# Patient Record
Sex: Male | Born: 1970 | Race: White | Hispanic: No | Marital: Married | Smoking: Former smoker
Health system: Southern US, Community
[De-identification: ages and names within clinical notes are randomized; demographics above are authoritative.]

## PROBLEM LIST (undated history)

## (undated) DIAGNOSIS — I10 Essential (primary) hypertension: Secondary | ICD-10-CM

## (undated) DIAGNOSIS — J309 Allergic rhinitis, unspecified: Secondary | ICD-10-CM

## (undated) DIAGNOSIS — K589 Irritable bowel syndrome without diarrhea: Secondary | ICD-10-CM

## (undated) DIAGNOSIS — E785 Hyperlipidemia, unspecified: Secondary | ICD-10-CM

## (undated) DIAGNOSIS — E119 Type 2 diabetes mellitus without complications: Secondary | ICD-10-CM

## (undated) DIAGNOSIS — T884XXA Failed or difficult intubation, initial encounter: Secondary | ICD-10-CM

## (undated) DIAGNOSIS — F32A Depression, unspecified: Secondary | ICD-10-CM

## (undated) DIAGNOSIS — F329 Major depressive disorder, single episode, unspecified: Secondary | ICD-10-CM

## (undated) DIAGNOSIS — M549 Dorsalgia, unspecified: Secondary | ICD-10-CM

## (undated) DIAGNOSIS — F419 Anxiety disorder, unspecified: Secondary | ICD-10-CM

## (undated) DIAGNOSIS — R351 Nocturia: Secondary | ICD-10-CM

## (undated) DIAGNOSIS — Z87442 Personal history of urinary calculi: Secondary | ICD-10-CM

## (undated) DIAGNOSIS — G43909 Migraine, unspecified, not intractable, without status migrainosus: Secondary | ICD-10-CM

## (undated) DIAGNOSIS — T7840XA Allergy, unspecified, initial encounter: Secondary | ICD-10-CM

## (undated) DIAGNOSIS — G709 Myoneural disorder, unspecified: Secondary | ICD-10-CM

## (undated) HISTORY — DX: Allergic rhinitis, unspecified: J30.9

## (undated) HISTORY — DX: Migraine, unspecified, not intractable, without status migrainosus: G43.909

## (undated) HISTORY — DX: Myoneural disorder, unspecified: G70.9

## (undated) HISTORY — DX: Essential (primary) hypertension: I10

## (undated) HISTORY — DX: Allergy, unspecified, initial encounter: T78.40XA

## (undated) HISTORY — PX: FINGER SURGERY: SHX640

## (undated) HISTORY — DX: Irritable bowel syndrome, unspecified: K58.9

## (undated) HISTORY — DX: Anxiety disorder, unspecified: F41.9

## (undated) HISTORY — PX: CYSTOSCOPY: SUR368

## (undated) HISTORY — DX: Hyperlipidemia, unspecified: E78.5

## (undated) HISTORY — DX: Dorsalgia, unspecified: M54.9

---

## 1998-06-18 ENCOUNTER — Emergency Department (HOSPITAL_COMMUNITY): Admission: EM | Admit: 1998-06-18 | Discharge: 1998-06-18 | Payer: Self-pay | Admitting: Emergency Medicine

## 1998-09-06 ENCOUNTER — Emergency Department (HOSPITAL_COMMUNITY): Admission: EM | Admit: 1998-09-06 | Discharge: 1998-09-06 | Payer: Self-pay | Admitting: Emergency Medicine

## 1998-10-25 ENCOUNTER — Emergency Department (HOSPITAL_COMMUNITY): Admission: EM | Admit: 1998-10-25 | Discharge: 1998-10-25 | Payer: Self-pay | Admitting: Emergency Medicine

## 2000-02-17 ENCOUNTER — Emergency Department (HOSPITAL_COMMUNITY): Admission: EM | Admit: 2000-02-17 | Discharge: 2000-02-17 | Payer: Self-pay | Admitting: Emergency Medicine

## 2000-06-26 ENCOUNTER — Emergency Department (HOSPITAL_COMMUNITY): Admission: EM | Admit: 2000-06-26 | Discharge: 2000-06-26 | Payer: Self-pay

## 2000-11-26 ENCOUNTER — Emergency Department (HOSPITAL_COMMUNITY): Admission: EM | Admit: 2000-11-26 | Discharge: 2000-11-27 | Payer: Self-pay | Admitting: Emergency Medicine

## 2000-11-26 ENCOUNTER — Encounter: Payer: Self-pay | Admitting: Emergency Medicine

## 2000-11-27 ENCOUNTER — Encounter: Payer: Self-pay | Admitting: Emergency Medicine

## 2000-12-24 ENCOUNTER — Ambulatory Visit (HOSPITAL_COMMUNITY): Admission: RE | Admit: 2000-12-24 | Discharge: 2000-12-24 | Payer: Self-pay | Admitting: Neurology

## 2000-12-24 ENCOUNTER — Encounter: Payer: Self-pay | Admitting: Neurology

## 2001-03-14 ENCOUNTER — Emergency Department (HOSPITAL_COMMUNITY): Admission: EM | Admit: 2001-03-14 | Discharge: 2001-03-14 | Payer: Self-pay | Admitting: Emergency Medicine

## 2003-03-14 ENCOUNTER — Emergency Department (HOSPITAL_COMMUNITY): Admission: EM | Admit: 2003-03-14 | Discharge: 2003-03-14 | Payer: Self-pay | Admitting: Emergency Medicine

## 2003-05-06 ENCOUNTER — Emergency Department (HOSPITAL_COMMUNITY): Admission: EM | Admit: 2003-05-06 | Discharge: 2003-05-06 | Payer: Self-pay | Admitting: Emergency Medicine

## 2005-05-07 ENCOUNTER — Ambulatory Visit: Payer: Self-pay | Admitting: Internal Medicine

## 2005-10-23 ENCOUNTER — Ambulatory Visit: Payer: Self-pay | Admitting: Internal Medicine

## 2007-08-18 DIAGNOSIS — G43909 Migraine, unspecified, not intractable, without status migrainosus: Secondary | ICD-10-CM | POA: Insufficient documentation

## 2007-08-18 DIAGNOSIS — J309 Allergic rhinitis, unspecified: Secondary | ICD-10-CM | POA: Insufficient documentation

## 2007-08-18 HISTORY — DX: Allergic rhinitis, unspecified: J30.9

## 2007-12-08 DIAGNOSIS — J069 Acute upper respiratory infection, unspecified: Secondary | ICD-10-CM | POA: Insufficient documentation

## 2007-12-11 ENCOUNTER — Ambulatory Visit: Payer: Self-pay | Admitting: Family Medicine

## 2008-03-07 ENCOUNTER — Ambulatory Visit: Payer: Self-pay | Admitting: Internal Medicine

## 2008-03-07 DIAGNOSIS — K5289 Other specified noninfective gastroenteritis and colitis: Secondary | ICD-10-CM | POA: Insufficient documentation

## 2008-08-29 ENCOUNTER — Ambulatory Visit: Payer: Self-pay | Admitting: Internal Medicine

## 2008-08-31 ENCOUNTER — Telehealth: Payer: Self-pay | Admitting: Internal Medicine

## 2008-09-01 ENCOUNTER — Ambulatory Visit: Payer: Self-pay | Admitting: Internal Medicine

## 2008-09-01 ENCOUNTER — Encounter: Payer: Self-pay | Admitting: Internal Medicine

## 2008-09-01 LAB — CONVERTED CEMR LAB
AST: 26 units/L (ref 0–37)
Alkaline Phosphatase: 54 units/L (ref 39–117)
BUN: 11 mg/dL (ref 6–23)
Bilirubin, Direct: 0.1 mg/dL (ref 0.0–0.3)
Chloride: 106 meq/L (ref 96–112)
Eosinophils Absolute: 0.2 10*3/uL (ref 0.0–0.7)
Eosinophils Relative: 3.4 % (ref 0.0–5.0)
GFR calc Af Amer: 109 mL/min
GFR calc non Af Amer: 90 mL/min
MCV: 91.1 fL (ref 78.0–100.0)
Monocytes Absolute: 0.4 10*3/uL (ref 0.1–1.0)
Neutrophils Relative %: 62 % (ref 43.0–77.0)
Platelets: 293 10*3/uL (ref 150–400)
Potassium: 3.9 meq/L (ref 3.5–5.1)
RDW: 11.3 % — ABNORMAL LOW (ref 11.5–14.6)
Sodium: 144 meq/L (ref 135–145)
Total Bilirubin: 0.8 mg/dL (ref 0.3–1.2)
WBC: 5.2 10*3/uL (ref 4.5–10.5)

## 2008-09-27 ENCOUNTER — Ambulatory Visit: Payer: Self-pay | Admitting: Internal Medicine

## 2008-10-01 ENCOUNTER — Telehealth: Payer: Self-pay | Admitting: Family Medicine

## 2009-02-06 ENCOUNTER — Ambulatory Visit: Payer: Self-pay | Admitting: Internal Medicine

## 2009-04-19 ENCOUNTER — Emergency Department (HOSPITAL_COMMUNITY): Admission: EM | Admit: 2009-04-19 | Discharge: 2009-04-19 | Payer: Self-pay | Admitting: Emergency Medicine

## 2009-11-27 ENCOUNTER — Telehealth: Payer: Self-pay | Admitting: Internal Medicine

## 2010-01-08 ENCOUNTER — Ambulatory Visit: Payer: Self-pay | Admitting: Internal Medicine

## 2010-02-09 ENCOUNTER — Ambulatory Visit: Payer: Self-pay | Admitting: Internal Medicine

## 2010-02-09 DIAGNOSIS — R109 Unspecified abdominal pain: Secondary | ICD-10-CM | POA: Insufficient documentation

## 2010-02-09 DIAGNOSIS — I10 Essential (primary) hypertension: Secondary | ICD-10-CM | POA: Insufficient documentation

## 2010-02-09 DIAGNOSIS — F411 Generalized anxiety disorder: Secondary | ICD-10-CM | POA: Insufficient documentation

## 2010-02-09 LAB — CONVERTED CEMR LAB
ALT: 32 units/L (ref 0–53)
AST: 22 units/L (ref 0–37)
BUN: 12 mg/dL (ref 6–23)
Basophils Relative: 0.7 % (ref 0.0–3.0)
Bilirubin, Direct: 0 mg/dL (ref 0.0–0.3)
Chloride: 106 meq/L (ref 96–112)
Eosinophils Relative: 3.5 % (ref 0.0–5.0)
GFR calc non Af Amer: 100.9 mL/min (ref >60)
HCT: 45.2 % (ref 39.0–52.0)
HDL: 42.6 mg/dL (ref >39.00)
Lymphs Abs: 0.8 10*3/uL (ref 0.7–4.0)
MCV: 92.3 fL (ref 78.0–100.0)
Monocytes Absolute: 0.4 10*3/uL (ref 0.1–1.0)
Monocytes Relative: 11 % (ref 3.0–12.0)
Platelets: 225 10*3/uL (ref 150.0–400.0)
Potassium: 3.5 meq/L (ref 3.5–5.1)
RBC: 4.9 M/uL (ref 4.22–5.81)
Sodium: 141 meq/L (ref 135–145)
TSH: 0.68 microintl units/mL (ref 0.35–5.50)
Total Bilirubin: 0.8 mg/dL (ref 0.3–1.2)
Total CHOL/HDL Ratio: 5
Total Protein: 7.8 g/dL (ref 6.0–8.3)
VLDL: 26 mg/dL (ref 0.0–40.0)
WBC: 3.7 10*3/uL — ABNORMAL LOW (ref 4.5–10.5)

## 2010-02-12 DIAGNOSIS — E785 Hyperlipidemia, unspecified: Secondary | ICD-10-CM | POA: Insufficient documentation

## 2010-02-12 HISTORY — DX: Hyperlipidemia, unspecified: E78.5

## 2010-03-09 ENCOUNTER — Ambulatory Visit: Payer: Self-pay | Admitting: Internal Medicine

## 2010-03-09 DIAGNOSIS — M549 Dorsalgia, unspecified: Secondary | ICD-10-CM | POA: Insufficient documentation

## 2010-03-13 ENCOUNTER — Encounter (INDEPENDENT_AMBULATORY_CARE_PROVIDER_SITE_OTHER): Payer: Self-pay | Admitting: *Deleted

## 2010-09-04 ENCOUNTER — Emergency Department (HOSPITAL_COMMUNITY): Admission: EM | Admit: 2010-09-04 | Discharge: 2010-09-04 | Payer: Self-pay | Admitting: Emergency Medicine

## 2010-09-04 ENCOUNTER — Ambulatory Visit: Payer: Self-pay | Admitting: Internal Medicine

## 2010-09-05 ENCOUNTER — Telehealth: Payer: Self-pay | Admitting: Internal Medicine

## 2010-09-05 ENCOUNTER — Ambulatory Visit (HOSPITAL_COMMUNITY): Admission: RE | Admit: 2010-09-05 | Discharge: 2010-09-05 | Payer: Self-pay | Admitting: Urology

## 2010-09-05 ENCOUNTER — Encounter: Payer: Self-pay | Admitting: Internal Medicine

## 2010-09-11 ENCOUNTER — Encounter: Payer: Self-pay | Admitting: Internal Medicine

## 2010-09-20 ENCOUNTER — Ambulatory Visit (HOSPITAL_COMMUNITY): Admission: RE | Admit: 2010-09-20 | Discharge: 2010-09-20 | Payer: Self-pay | Admitting: Urology

## 2010-10-04 ENCOUNTER — Telehealth: Payer: Self-pay | Admitting: Internal Medicine

## 2010-10-18 ENCOUNTER — Encounter: Payer: Self-pay | Admitting: Internal Medicine

## 2011-01-10 NOTE — Letter (Signed)
Summary: [facility]  [facility]   Imported By: Maryln Gottron 09/18/2010 10:39:12  _____________________________________________________________________  External Attachment:    Type:   Image     Comment:   External Document

## 2011-01-10 NOTE — Assessment & Plan Note (Signed)
Summary: stomach upset/abdominal pain/diarrhea/cjr   Vital Signs:  Patient profile:   40 year old male Weight:      185 pounds Temp:     98.1 degrees F oral BP sitting:   148 / 102  (left arm) Cuff size:   regular  Vitals Entered By: Raechel Ache, RN (January 08, 2010 4:21 PM) CC: C/o abdominal pain since Saturday 3am   CC:  C/o abdominal pain since Saturday 3am.  History of Present Illness: 40 year old patient who presents with a 3-day history of abdominal pain.  The pain awoke the patient at approximately 3 a.m. Saturday morning and has persisted intermittently over the past two days.  Pain has been fairly constant in a generalized distribution.  He has had some mild nausea and for the past two days watery diarrhea.  There is been no vomiting, and his appetite is fairly well maintained.Marland Kitchen  He denies any fever  Allergies: 1)  ! Penicillin V Potassium (Penicillin V Potassium) 2)  * Doxycycline  Past History:  Past Medical History: Reviewed history from 08/18/2007 and no changes required. Allergic rhinitis migraine headache  Review of Systems       The patient complains of anorexia and abdominal pain.  The patient denies fever, weight loss, weight gain, vision loss, decreased hearing, hoarseness, chest pain, syncope, dyspnea on exertion, peripheral edema, prolonged cough, headaches, hemoptysis, melena, hematochezia, severe indigestion/heartburn, hematuria, incontinence, genital sores, muscle weakness, suspicious skin lesions, transient blindness, difficulty walking, depression, unusual weight change, abnormal bleeding, enlarged lymph nodes, angioedema, breast masses, and testicular masses.    Physical Exam  General:  Well-developed,well-nourished,in no acute distress; alert,appropriate and cooperative throughout examination Head:  Normocephalic and atraumatic without obvious abnormalities. No apparent alopecia or balding. Eyes:  No corneal or conjunctival inflammation noted.  EOMI. Perrla. Funduscopic exam benign, without hemorrhages, exudates or papilledema. Vision grossly normal. Mouth:  Oral mucosa and oropharynx without lesions or exudates.  Teeth in good repair. Neck:  No deformities, masses, or tenderness noted. Lungs:  Normal respiratory effort, chest expands symmetrically. Lungs are clear to auscultation, no crackles or wheezes. Heart:  Normal rate and regular rhythm. S1 and S2 normal without gallop, murmur, click, rub or other extra sounds. Abdomen:  mild diffuse tenderness; no guarding or rebound bowel sounds are active Msk:  No deformity or scoliosis noted of thoracic or lumbar spine.     Impression & Recommendations:  Problem # 1:  GASTROENTERITIS (ICD-558.9) this appears to be the most likely diagnosis.  The patient is aware of the possibility of atypical appendicitis and will report to the ED for CT abdomen if pain worsens tonight.  Will observe and treat symptomatically  Complete Medication List: 1)  Paxil 20 Mg Tabs (Paroxetine hcl) .... Take 1 tablet by mouth once a day 2)  Promethazine Hcl 25 Mg Tabs (Promethazine hcl) .... As needed 3)  Hydrocodone-acetaminophen 5-500 Mg Tabs (Hydrocodone-acetaminophen) .... One every 4-6 hours for pain  Patient Instructions: 1)  Drink plenty of fluids and take solids as you feel better. If you are unable to keep anything down and/or you show signs of dehydration(dry/cracked lips, lack of tears, not urinating, very sleepy), call our office. 2)  Call if any worsening symptoms and report to ED for X ray if pain severe Prescriptions: PROMETHAZINE HCL 25 MG  TABS (PROMETHAZINE HCL) as needed  #20 x 0   Entered and Authorized by:   Gordy Savers  MD   Signed by:   Theron Arista  Lysle Dingwall  MD on 01/08/2010   Method used:   Print then Give to Patient   RxID:   [id number] HYDROCODONE-ACETAMINOPHEN 5-500 MG  TABS (HYDROCODONE-ACETAMINOPHEN) one every 4-6 hours for pain  #50 x 0   Entered and Authorized by:    Gordy Savers  MD   Signed by:   Gordy Savers  MD on 01/08/2010   Method used:   Print then Give to Patient   RxID:   [id number]

## 2011-01-10 NOTE — Assessment & Plan Note (Signed)
Summary: 1 month fup//ccm   Vital Signs:  Patient profile:   40 year old male Weight:      182 pounds Temp:     97.9 degrees F oral BP sitting:   120 / 80  (left arm) Cuff size:   regular  Vitals Entered By: Duard Brady LPN (March 09, 1609 4:23 PM) CC: 1 mos rov - c/o low back pain - no injury or fall  Is Patient Diabetic? No   CC:  1 mos rov - c/o low back pain - no injury or fall .  History of Present Illness: 40 -year-old patient who is seen today for follow-up of his hypertension.  Recently he has had some left lumbar pain following heavy lifting of a TV.  He has a long history of chronic abdominal pain that persists and this is his chief complaint today.  It is felt that he probably has IBS.  He is on a SSRI and has a high fiber diet and anti-spasmodics without much benefit.  He is requesting a GI referral. He was placed on antihypertensive medication.  Last visit, which he tolerates well.  Blood pressure today is well controlled  Preventive Screening-Counseling & Management  Alcohol-Tobacco     Smoking Status: quit  Allergies: 1)  ! Penicillin V Potassium (Penicillin V Potassium) 2)  * Doxycycline  Past History:  Past Medical History: Reviewed history from 02/09/2010 and no changes required. Allergic rhinitis migraine headache Hypertension Anxiety probable IBS Hyperlipidemia  Review of Systems       The patient complains of abdominal pain.  The patient denies anorexia, fever, weight loss, weight gain, vision loss, decreased hearing, hoarseness, chest pain, syncope, dyspnea on exertion, peripheral edema, prolonged cough, headaches, hemoptysis, melena, hematochezia, severe indigestion/heartburn, hematuria, incontinence, genital sores, muscle weakness, suspicious skin lesions, transient blindness, difficulty walking, depression, unusual weight change, abnormal bleeding, enlarged lymph nodes, angioedema, breast masses, and testicular masses.    Physical  Exam  General:  Well-developed,well-nourished,in no acute distress; alert,appropriate and cooperative throughout examination; 118/78 Neck:  No deformities, masses, or tenderness noted. Lungs:  Normal respiratory effort, chest expands symmetrically. Lungs are clear to auscultation, no crackles or wheezes. Heart:  Normal rate and regular rhythm. S1 and S2 normal without gallop, murmur, click, rub or other extra sounds. Abdomen:  mild diffuse tenderness.  No masses.  Active bowel sounds.  No organomegaly.  Pain is maximal in the periumbilical and epigastric region   Impression & Recommendations:  Problem # 1:  HYPERTENSION (ICD-401.9)  The following medications were removed from the medication list:    Benazepril Hcl 20 Mg Tabs (Benazepril hcl) ..... One daily His updated medication list for this problem includes:    Lisinopril-hydrochlorothiazide 20-25 Mg Tabs (Lisinopril-hydrochlorothiazide) ..... One daily  Problem # 2:  ABDOMINAL PAIN (ICD-789.00)  Orders: Gastroenterology Referral (GI)  Problem # 3:  BACK PAIN (ICD-724.5)  His updated medication list for this problem includes:    Hydrocodone-acetaminophen 5-500 Mg Tabs (Hydrocodone-acetaminophen) ..... One every 4-6 hours for pain    Celebrex 200 Mg Caps (Celecoxib) .Marland Kitchen..Marland Kitchen Two daily    Cyclobenzaprine Hcl 10 Mg Tabs (Cyclobenzaprine hcl) ..... One three times daily as needed  Complete Medication List: 1)  Paxil 20 Mg Tabs (Paroxetine hcl) .... Take 1 tablet by mouth once a day 2)  Promethazine Hcl 25 Mg Tabs (Promethazine hcl) .... As needed 3)  Hydrocodone-acetaminophen 5-500 Mg Tabs (Hydrocodone-acetaminophen) .... One every 4-6 hours for pain 4)  Hyomax-sl 0.125 Mg Subl (  Hyoscyamine sulfate) .... One sublingually p.r.n. abdominal pain 5)  Celebrex 200 Mg Caps (Celecoxib) .... Two daily 6)  Cyclobenzaprine Hcl 10 Mg Tabs (Cyclobenzaprine hcl) .... One three times daily as needed 7)  Lisinopril-hydrochlorothiazide 20-25 Mg  Tabs (Lisinopril-hydrochlorothiazide) .... One daily  Patient Instructions: 1)  Most patients (90%) with low back pain will improve with time (2-6 days). Keep active but avoid activities that are painful. Apply moist heat and/or ice to lower back several times a day. 2)  GI referral, as scheduled Prescriptions: LISINOPRIL-HYDROCHLOROTHIAZIDE 20-25 MG TABS (LISINOPRIL-HYDROCHLOROTHIAZIDE) one daily  #90 x 2   Entered and Authorized by:   Gordy Savers  MD   Signed by:   Gordy Savers  MD on 03/09/2010   Method used:   Print then Give to Patient   RxID:   [id number] CYCLOBENZAPRINE HCL 10 MG TABS (CYCLOBENZAPRINE HCL) one three times daily as needed  #30 x 0   Entered and Authorized by:   Gordy Savers  MD   Signed by:   Gordy Savers  MD on 03/09/2010   Method used:   Print then Give to Patient   RxID:   [id number] HYDROCODONE-ACETAMINOPHEN 5-500 MG  TABS (HYDROCODONE-ACETAMINOPHEN) one every 4-6 hours for pain  #50 x 2and a   Entered and Authorized by:   Gordy Savers  MD   Signed by:   Gordy Savers  MD on 03/09/2010   Method used:   Print then Give to Patient   RxID:   [id number] CYCLOBENZAPRINE HCL 10 MG TABS (CYCLOBENZAPRINE HCL) one three times daily as needed  #30 x 0   Entered and Authorized by:   Gordy Savers  MD   Signed by:   Gordy Savers  MD on 03/09/2010   Method used:   Print then Give to Patient   RxID:   [id number] BENAZEPRIL HCL 20 MG TABS (BENAZEPRIL HCL) one daily  #90 x 2   Entered and Authorized by:   Gordy Savers  MD   Signed by:   Gordy Savers  MD on 03/09/2010   Method used:   Print then Give to Patient   RxID:   [id number] HYDROCODONE-ACETAMINOPHEN 5-500 MG  TABS (HYDROCODONE-ACETAMINOPHEN) one every 4-6 hours for pain  #50 x 2and a   Entered and Authorized by:   Gordy Savers  MD   Signed by:   Gordy Savers  MD on 03/09/2010   Method used:   Print then  Give to Patient   RxID:   [id number]

## 2011-01-10 NOTE — Letter (Signed)
Summary: [facility]  [facility]   Imported By: Maryln Gottron 09/12/2010 13:56:16  _____________________________________________________________________  External Attachment:    Type:   Image     Comment:   External Document

## 2011-01-10 NOTE — Assessment & Plan Note (Signed)
Summary: CPX/NJR/pt coming in fasting/pr rsc/cjr   Vital Signs:  Patient profile:   40 year old Henry Height:      69.5 inches Weight:      182 pounds BMI:     26.59 Temp:     98.6 degrees F oral BP sitting:   152 / 100  (right arm) Cuff size:   regular  Vitals Entered By: Duard Brady LPN (February 09, 1609 8:46 AM) CC: cpx - has concerned to discuss Is Patient Diabetic? No   CC:  cpx - has concerned to discuss.  History of Present Illness: 40 year old patient who is seen today for a wellness exam.  He was seen two months ago with the abdominal pain and acute diarrheal illness.  He continues to have postprandial generalized crampy abdominal pain that  lasts  15 to 60 minutes.  He states that he has had stomach issues chronically and these are very similar.  Denies any change in his bowel habits.  Stool is well formed and regular.  Preventive Screening-Counseling & Management  Alcohol-Tobacco     Smoking Status: quit  Allergies: 1)  ! Penicillin V Potassium (Penicillin V Potassium) 2)  * Doxycycline  Past History:  Past Medical History: Allergic rhinitis migraine headache Hypertension Anxiety probable IBS Hyperlipidemia  Past Surgical History: none   Family History: Reviewed history and no changes required. Father- age 15 Unclear details PGM- DM2 Mother- age 51 good health  2 step brothers One step sister  Social History: Reviewed history and no changes required. d/c tobacco 5 years ago Married   one step daughter age 50 Smoking Status:  quit  Review of Systems       The patient complains of abdominal pain and testicular masses.  The patient denies anorexia, fever, weight loss, weight gain, vision loss, decreased hearing, hoarseness, chest pain, syncope, dyspnea on exertion, peripheral edema, prolonged cough, headaches, hemoptysis, melena, hematochezia, severe indigestion/heartburn, hematuria, incontinence, genital sores, muscle weakness, suspicious  skin lesions, transient blindness, difficulty walking, depression, unusual weight change, abnormal bleeding, enlarged lymph nodes, angioedema, and breast masses.    Physical Exam  General:  Well-developed,well-nourished,in no acute distress; alert,appropriate and cooperative throughout examination; 160/100 Head:  Normocephalic and atraumatic without obvious abnormalities. No apparent alopecia or balding. Eyes:  No corneal or conjunctival inflammation noted. EOMI. Perrla. Funduscopic exam benign, without hemorrhages, exudates or papilledema. Vision grossly normal. Ears:  External ear exam shows no significant lesions or deformities.  Otoscopic examination reveals clear canals, tympanic membranes are intact bilaterally without bulging, retraction, inflammation or discharge. Hearing is grossly normal bilaterally. Nose:  External nasal examination shows no deformity or inflammation. Nasal mucosa are pink and moist without lesions or exudates. Mouth:  Oral mucosa and oropharynx without lesions or exudates.  Teeth in good repair. Neck:  No deformities, masses, or tenderness noted. Chest Wall:  No deformities, masses, tenderness or gynecomastia noted. Breasts:  No masses or gynecomastia noted Lungs:  Normal respiratory effort, chest expands symmetrically. Lungs are clear to auscultation, no crackles or wheezes. Heart:  Normal rate and regular rhythm. S1 and S2 normal without gallop, murmur, click, rub or other extra sounds. Abdomen:  bowel sounds are normal.  He had mild tenderness fairly diffusely.  No organomegaly or masses.  No distention Genitalia:  right testicle is enlarged, probably with a varicocele.  This has been chronic and unchanged for years Msk:  No deformity or scoliosis noted of thoracic or lumbar spine.   Pulses:  R and L  carotid,radial,femoral,dorsalis pedis and posterior tibial pulses are full and equal bilaterally Extremities:  No clubbing, cyanosis, edema, or deformity noted with  normal full range of motion of all joints.   Neurologic:  No cranial nerve deficits noted. Station and gait are normal. Plantar reflexes are down-going bilaterally. DTRs are symmetrical throughout. Sensory, motor and coordinative functions appear intact. Skin:  Intact without suspicious lesions or rashes Cervical Nodes:  No lymphadenopathy noted Axillary Nodes:  No palpable lymphadenopathy Inguinal Nodes:  No significant adenopathy Psych:  Cognition and judgment appear intact. Alert and cooperative with normal attention span and concentration. No apparent delusions, illusions, hallucinations   Impression & Recommendations:  Problem # 1:  Preventive Health Care (ICD-V70.0)  Orders: Venipuncture (16109) TLB-Lipid Panel (80061-LIPID) TLB-BMP (Basic Metabolic Panel-BMET) (80048-METABOL) TLB-CBC Platelet - w/Differential (85025-CBCD) TLB-Hepatic/Liver Function Pnl (80076-HEPATIC) TLB-TSH (Thyroid Stimulating Hormone) (84443-TSH)  Problem # 2:  HYPERTENSION (ICD-401.9) will place on Benicar HCT and recheck in 4 weeks  Problem # 3:  ABDOMINAL PAIN (ICD-789.00) probably has IBS.  Will treat with sublingual levsin and high fiber diet  Complete Medication List: 1)  Paxil 20 Mg Tabs (Paroxetine hcl) .... Take 1 tablet by mouth once a day 2)  Promethazine Hcl 25 Mg Tabs (Promethazine hcl) .... As needed 3)  Hydrocodone-acetaminophen 5-500 Mg Tabs (Hydrocodone-acetaminophen) .... One every 4-6 hours for pain 4)  Hyomax-sl 0.125 Mg Subl (Hyoscyamine sulfate) .... One sublingually p.r.n. abdominal pain  Patient Instructions: 1)  Please schedule a follow-up appointment in 1 month. 2)  Limit your Sodium (Salt). 3)  It is important that you exercise regularly at least 20 minutes 5 times a week. If you develop chest pain, have severe difficulty breathing, or feel very tired , stop exercising immediately and seek medical attention. 4)  You need to lose weight. Consider a lower calorie diet and  regular exercise.  5)  Check your Blood Pressure regularly. If it is above: 150/90 you should make an appointment. Prescriptions: HYOMAX-SL 0.125 MG SUBL (HYOSCYAMINE SULFATE) one sublingually p.r.n. abdominal pain  #90 x 0   Entered and Authorized by:   Gordy Savers  MD   Signed by:   Gordy Savers  MD on 02/09/2010   Method used:   Print then Give to Patient   RxID:   [id number] HYOMAX-SL 0.125 MG SUBL (HYOSCYAMINE SULFATE) one sublingually p.r.n. abdominal pain  #90 x 0   Entered and Authorized by:   Gordy Savers  MD   Signed by:   Gordy Savers  MD on 02/09/2010   Method used:   Electronically to        [facility]  [phone]* (retail)       [address] [place], [place]  [address]       Ph: [phone] or [phone]       Fax: [phone]   RxID:   [id number] PAXIL 20 MG  TABS (PAROXETINE HCL) Take 1 tablet by mouth once a day  #90 x 2   Entered and Authorized by:   Gordy Savers  MD   Signed by:   Gordy Savers  MD on 02/09/2010   Method used:   Electronically to        [facility]  [phone]* (retail)       [address] [place], [place]  [address]       Ph: [phone] or [phone]  Fax: [phone]   RxID:   [id number]

## 2011-01-10 NOTE — Progress Notes (Signed)
Summary: Update  Phone Note Call from Patient   Caller: Patient Call For: Gordy Savers  MD Summary of Call: Bilateral kidney stones and pylenephritis (left) Is taking Rocephin, Pepsid , Protonix, pain and nausea meds.  Seeing Uro today. Was seen in ER last night. Question is whether to get more Benicar samples or prescription.  [facility] [phone] Initial call taken by: Lynann Beaver CMA,  September 05, 2010 8:23 AM  Follow-up for Phone Call        OK to contine lisinopril-hctz RX $10/3 months Follow-up by: Gordy Savers  MD,  September 05, 2010 8:39 AM    Prescriptions: LISINOPRIL-HYDROCHLOROTHIAZIDE 20-25 MG TABS (LISINOPRIL-HYDROCHLOROTHIAZIDE) one daily  #90 x 2   Entered by:   Lynann Beaver CMA   Authorized by:   Gordy Savers  MD   Signed by:   Lynann Beaver CMA on 09/05/2010   Method used:   Electronically to        [facility]  [phone]* (retail)       [address] [place], [place]  [address]       Ph: [phone] or [phone]       Fax: [phone]   RxID:   [id number]

## 2011-01-10 NOTE — Progress Notes (Signed)
Summary: refill hydrocodone  Phone Note From Pharmacy   Call[facility]  [phone]* Call For: k  Summary of Call: refill hydrcodone 5/500 1 by mouth every 4-6 hours as needed for pain Initial call taken by: Alfred Levins, CMA,  October 04, 2010 8:54 AM  Follow-up for Phone Call        Phone call completed, Pharmacist called Follow-up by: Alfred Levins, CMA,  October 04, 2010 11:28 AM    Prescriptions: HYDROCODONE-ACETAMINOPHEN 5-500 MG  TABS (HYDROCODONE-ACETAMINOPHEN) one every 4-6 hours for pain  #50 x 3   Entered by:   Alfred Levins, CMA   Authorized by:   Gordy Savers  MD   Signed by:   Alfred Levins, CMA on 10/04/2010   Method used:   Telephoned to ...       [facility]  [phone]* (retail)       [address] [place], [place]  [address]       Ph: [phone] or [phone]       Fax: [phone]   RxID:   [id number]  #50  RF 3

## 2011-01-10 NOTE — Letter (Signed)
Summary: New Patient letter  [facility]  [address] [place], [place] [address]   Phone: [phone]  Fax: [phone]       03/13/2010 MRN: [id number]  [facility] [address] [place], [place]  [address]  Dear Dustin Henry,  Welcome to the Gastroenterology Division at [facility].    You are scheduled to see Dr.  Jarold Motto on 04-13-10 at 9:30am on the 3rd floor at [facility], [address]  We ask that you try to arrive at our office 15 minutes prior to your appointment time to allow for check-in.  We would like you to complete the enclosed self-administered evaluation form prior to your visit and bring it with you on the day of your appointment.  We will review it with you.  Also, please bring a complete list of all your medications or, if you prefer, bring the medication bottles and we will list them.  Please bring your insurance card so that we may make a copy of it.  If your insurance requires a referral to see a specialist, please bring your referral form from your primary care physician.  Co-payments are due at the time of your visit and may be paid by cash, check or credit card.     Your office visit will consist of a consult with your physician (includes a physical exam), any laboratory testing he/she may order, scheduling of any necessary diagnostic testing (e.g. x-ray, ultrasound, CT-scan), and scheduling of a procedure (e.g. Endoscopy, Colonoscopy) if required.  Please allow enough time on your schedule to allow for any/all of these possibilities.    If you cannot keep your appointment, please call [phone] to cancel or reschedule prior to your appointment date.  This allows [place] the opportunity to schedule an appointment for another patient in need of care.  If you do not cancel or reschedule by 5 p.m. the business day prior to your appointment date, you will be charged a $50.00 late cancellation/no-show fee.    Thank you for choosing  [facility] for your medical needs.  We appreciate the opportunity to care for you.  Please visit [place] at our website  to learn more about our practice.                     Sincerely,                                                             The Gastroenterology Division

## 2011-01-10 NOTE — Letter (Signed)
Summary: [facility]  [facility]   Imported By: Maryln Gottron 10/25/2010 09:23:46  _____________________________________________________________________  External Attachment:    Type:   Image     Comment:   External Document

## 2011-02-21 LAB — BASIC METABOLIC PANEL
CO2: 30 mEq/L (ref 19–32)
Chloride: 102 mEq/L (ref 96–112)
GFR calc Af Amer: >60 mL/min (ref >60)
Glucose, Bld: 103 mg/dL — ABNORMAL HIGH (ref 70–99)
Sodium: 139 mEq/L (ref 135–145)

## 2011-02-21 LAB — URINE CULTURE
Culture  Setup Time: 201109280132
Culture: NO GROWTH

## 2011-02-21 LAB — URINALYSIS, ROUTINE W REFLEX MICROSCOPIC
Bilirubin Urine: NEGATIVE
Glucose, UA: NEGATIVE mg/dL
Hgb urine dipstick: NEGATIVE
Specific Gravity, Urine: 1.016 (ref 1.005–1.030)

## 2011-02-21 LAB — CBC
HCT: 40.2 % (ref 39.0–52.0)
Hemoglobin: 14.2 g/dL (ref 13.0–17.0)
MCV: 89.8 fL (ref 78.0–100.0)
RBC: 4.48 MIL/uL (ref 4.22–5.81)
WBC: 11.2 10*3/uL — ABNORMAL HIGH (ref 4.0–10.5)

## 2011-02-21 LAB — COMPREHENSIVE METABOLIC PANEL
Albumin: 4.7 g/dL (ref 3.5–5.2)
Alkaline Phosphatase: 44 U/L (ref 39–117)
BUN: 15 mg/dL (ref 6–23)
CO2: 24 mEq/L (ref 19–32)
Chloride: 104 mEq/L (ref 96–112)
GFR calc non Af Amer: 58 mL/min — ABNORMAL LOW (ref >60)
Glucose, Bld: 96 mg/dL (ref 70–99)
Potassium: 3.8 mEq/L (ref 3.5–5.1)
Total Bilirubin: 0.5 mg/dL (ref 0.3–1.2)

## 2011-02-21 LAB — CK TOTAL AND CKMB (NOT AT ARMC): CK, MB: 2.6 ng/mL (ref 0.3–4.0)

## 2011-02-21 LAB — DIFFERENTIAL
Basophils Absolute: 0 10*3/uL (ref 0.0–0.1)
Basophils Relative: 0 % (ref 0–1)
Monocytes Absolute: 0.7 10*3/uL (ref 0.1–1.0)
Neutro Abs: 9.8 10*3/uL — ABNORMAL HIGH (ref 1.7–7.7)

## 2011-02-21 LAB — LIPASE, BLOOD: Lipase: 19 U/L (ref 11–59)

## 2011-02-26 ENCOUNTER — Telehealth: Payer: Self-pay

## 2011-02-26 DIAGNOSIS — R52 Pain, unspecified: Secondary | ICD-10-CM

## 2011-02-26 NOTE — Telephone Encounter (Signed)
Fax recv'd from [facility] -refill request for vicodin 5-500mg  Last seen 03/2010 , last Rx'd 10/2702011 #50 3RF Please advise

## 2011-02-28 MED ORDER — HYDROCODONE-ACETAMINOPHEN 5-500 MG PO TABS: 1.0000 | ORAL_TABLET | Freq: Four times a day (QID) | ORAL | Status: AC | PRN

## 2011-02-28 NOTE — Telephone Encounter (Signed)
#  50  No RF

## 2011-02-28 NOTE — Telephone Encounter (Signed)
[facility] 

## 2011-03-19 LAB — CBC
MCHC: 34.7 g/dL (ref 30.0–36.0)
MCV: 90.3 fL (ref 78.0–100.0)
Platelets: 242 10*3/uL (ref 150–400)
WBC: 4.2 10*3/uL (ref 4.0–10.5)

## 2011-03-19 LAB — COMPREHENSIVE METABOLIC PANEL
AST: 29 U/L (ref 0–37)
Albumin: 4.4 g/dL (ref 3.5–5.2)
Calcium: 9.2 mg/dL (ref 8.4–10.5)
Creatinine, Ser: 0.9 mg/dL (ref 0.4–1.5)
GFR calc Af Amer: >60 mL/min (ref >60)

## 2011-03-19 LAB — DIFFERENTIAL
Eosinophils Relative: 5 % (ref 0–5)
Lymphocytes Relative: 26 % (ref 12–46)
Lymphs Abs: 1.1 10*3/uL (ref 0.7–4.0)
Monocytes Absolute: 0.4 10*3/uL (ref 0.1–1.0)

## 2011-05-09 ENCOUNTER — Other Ambulatory Visit: Payer: Self-pay | Admitting: Internal Medicine

## 2011-05-09 ENCOUNTER — Other Ambulatory Visit: Payer: Self-pay

## 2011-05-09 MED ORDER — HYDROCODONE-ACETAMINOPHEN 5-500 MG PO TABS: 1.0000 | ORAL_TABLET | Freq: Four times a day (QID) | ORAL | Status: DC | PRN

## 2011-05-09 NOTE — Telephone Encounter (Signed)
[facility]

## 2011-05-09 NOTE — Telephone Encounter (Signed)
[facility]  lastr seen 03/2010 - last written 02/2011 #50 no refills Please advise

## 2011-05-09 NOTE — Telephone Encounter (Signed)
30

## 2011-06-28 ENCOUNTER — Telehealth: Payer: Self-pay

## 2011-06-28 MED ORDER — HYDROCODONE-ACETAMINOPHEN 5-500 MG PO TABS: 1.0000 | ORAL_TABLET | Freq: Four times a day (QID) | ORAL | Status: DC | PRN

## 2011-06-28 NOTE — Telephone Encounter (Signed)
Faxed back to pharmacy

## 2011-06-28 NOTE — Telephone Encounter (Signed)
[facility] - last seen 03/2010 , last written 05/09/11 #30 0 RF Please advise

## 2011-06-28 NOTE — Telephone Encounter (Signed)
30

## 2011-08-02 ENCOUNTER — Encounter: Payer: Self-pay | Admitting: Internal Medicine

## 2011-08-02 ENCOUNTER — Ambulatory Visit (INDEPENDENT_AMBULATORY_CARE_PROVIDER_SITE_OTHER): Payer: BC Managed Care – PPO | Admitting: Internal Medicine

## 2011-08-02 VITALS — BP 120/82 | Temp 98.4°F | Wt 190.0 lb

## 2011-08-02 DIAGNOSIS — Z87448 Personal history of other diseases of urinary system: Secondary | ICD-10-CM

## 2011-08-02 DIAGNOSIS — N200 Calculus of kidney: Secondary | ICD-10-CM | POA: Insufficient documentation

## 2011-08-02 DIAGNOSIS — F411 Generalized anxiety disorder: Secondary | ICD-10-CM

## 2011-08-02 DIAGNOSIS — M549 Dorsalgia, unspecified: Secondary | ICD-10-CM

## 2011-08-02 MED ORDER — HYDROCODONE-ACETAMINOPHEN 5-500 MG PO TABS: 1.0000 | ORAL_TABLET | Freq: Four times a day (QID) | ORAL | Status: DC | PRN

## 2011-08-02 MED ORDER — LISINOPRIL-HYDROCHLOROTHIAZIDE 20-25 MG PO TABS: 1.0000 | ORAL_TABLET | Freq: Every day | ORAL | Status: DC

## 2011-08-02 MED ORDER — CYCLOBENZAPRINE HCL 10 MG PO TABS: 10.0000 mg | ORAL_TABLET | Freq: Three times a day (TID) | ORAL | Status: DC | PRN

## 2011-08-02 MED ORDER — METHYLPREDNISOLONE ACETATE 80 MG/ML IJ SUSP
80.0000 mg | Freq: Once | INTRAMUSCULAR | Status: AC
  Administered 2011-08-02: 80 mg via INTRAMUSCULAR

## 2011-08-02 MED ORDER — METHYLPREDNISOLONE ACETATE 80 MG/ML IJ SUSP: 80.0000 mg | Freq: Once | INTRAMUSCULAR | Status: DC

## 2011-08-02 MED ORDER — PAROXETINE HCL 20 MG PO TABS: 20.0000 mg | ORAL_TABLET | ORAL | Status: DC

## 2011-08-02 MED ORDER — MELOXICAM 15 MG PO TABS: 15.0000 mg | ORAL_TABLET | Freq: Every day | ORAL | Status: DC

## 2011-08-02 NOTE — Progress Notes (Signed)
  Subjective:    Patient ID: Dustin Henry, male    DOB: November 13, 1971, 40 y.o.   MRN: [id number]  HPI  40 year old patient who is seen today for followup. He has a history of back pain that he states has been present since October of last year following intervention for symptomatic renal stone disease. Over the past 3 weeks his lumbar pain has worsened. There is no radicular component her history of leg weakness. He has a physically very demanding job and also works Engineer, technical sales.   Review of Systems  Constitutional: Negative for fever, chills, appetite change and fatigue.  HENT: Negative for hearing loss, ear pain, congestion, sore throat, trouble swallowing, neck stiffness, dental problem, voice change and tinnitus.   Eyes: Negative for pain, discharge and visual disturbance.  Respiratory: Negative for cough, chest tightness, wheezing and stridor.   Cardiovascular: Negative for chest pain, palpitations and leg swelling.  Gastrointestinal: Negative for nausea, vomiting, abdominal pain, diarrhea, constipation, blood in stool and abdominal distention.  Genitourinary: Negative for urgency, hematuria, flank pain, discharge, difficulty urinating and genital sores.  Musculoskeletal: Positive for back pain. Negative for myalgias, joint swelling, arthralgias and gait problem.  Skin: Negative for rash.  Neurological: Negative for dizziness, syncope, speech difficulty, weakness, numbness and headaches.  Hematological: Negative for adenopathy. Does not bruise/bleed easily.  Psychiatric/Behavioral: Negative for behavioral problems and dysphoric mood. The patient is not nervous/anxious.        Objective:   Physical Exam  Constitutional: He appears well-developed and well-nourished. No distress.  Musculoskeletal: He exhibits no edema and no tenderness.       Negative straight leg test No motor weakness           Assessment & Plan:   Low back pain. Will treat with Depo-Medrol 80 mg  IM we'll place on daily analgesics anti-inflammatories. We'll refill his pain medication. He'll attempt to limit his strenuous activities

## 2011-08-02 NOTE — Patient Instructions (Signed)
Most patients with low back pain will improve with time over the next two to 6 weeks.  Keep active but avoid any activities that cause pain.  Apply moist heat to the low back area several times daily.  Limit your sodium (Salt) intake  Return in 6 months for follow-up   Call or return to clinic prn if these symptoms worsen or fail to improve as anticipated.

## 2011-12-17 ENCOUNTER — Other Ambulatory Visit: Payer: Self-pay

## 2011-12-17 MED ORDER — HYDROCODONE-ACETAMINOPHEN 5-500 MG PO TABS: 1.0000 | ORAL_TABLET | Freq: Four times a day (QID) | ORAL | Status: DC | PRN

## 2011-12-17 NOTE — Telephone Encounter (Signed)
[facility] 

## 2011-12-17 NOTE — Telephone Encounter (Signed)
Fax refill request for vicodin 5-500  Last seen 08/02/11   Last written 08/02/11  # 60 4RF Please advise

## 2011-12-17 NOTE — Telephone Encounter (Signed)
#  60  No RF

## 2011-12-26 ENCOUNTER — Other Ambulatory Visit: Payer: Self-pay

## 2011-12-26 MED ORDER — CYCLOBENZAPRINE HCL 10 MG PO TABS: 10.0000 mg | ORAL_TABLET | Freq: Three times a day (TID) | ORAL | Status: DC | PRN

## 2011-12-26 NOTE — Telephone Encounter (Signed)
Fax refill request for flexeril 10 mg  Last seen 07/2011 Last written 08/02/11 # 30 4RF  Please advise - ok'd vicodin # 60 0RF - on 12/17/11

## 2011-12-26 NOTE — Telephone Encounter (Signed)
10 mg  #50 generic  RF 2

## 2011-12-26 NOTE — Telephone Encounter (Signed)
done

## 2012-01-22 ENCOUNTER — Other Ambulatory Visit: Payer: Self-pay

## 2012-01-22 MED ORDER — HYDROCODONE-ACETAMINOPHEN 5-500 MG PO TABS: 1.0000 | ORAL_TABLET | Freq: Four times a day (QID) | ORAL | Status: DC | PRN

## 2012-01-22 NOTE — Telephone Encounter (Signed)
60

## 2012-01-22 NOTE — Telephone Encounter (Signed)
Rx called into [facility]

## 2012-01-22 NOTE — Telephone Encounter (Signed)
[facility] [address] - for vicodin 5-500  Last seen 08/02/11 (back pain)  Last written 12/17/11  # 60 0RF Please advise

## 2012-01-24 ENCOUNTER — Telehealth: Payer: Self-pay | Admitting: *Deleted

## 2012-01-24 NOTE — Telephone Encounter (Signed)
Pt had a root canal 12/1011,and was put on Prednisone recently with symptoms of pain and tingling down right arm, and he does not know whether to call his dentist or Dr. Kirtland Bouchard.

## 2012-01-24 NOTE — Telephone Encounter (Signed)
Suggest  Saturday clinic or office visit next week if persists

## 2012-01-24 NOTE — Telephone Encounter (Signed)
Pt feels much better.

## 2012-01-31 ENCOUNTER — Ambulatory Visit (INDEPENDENT_AMBULATORY_CARE_PROVIDER_SITE_OTHER): Payer: BC Managed Care – PPO | Admitting: Internal Medicine

## 2012-01-31 ENCOUNTER — Encounter: Payer: Self-pay | Admitting: Internal Medicine

## 2012-01-31 DIAGNOSIS — F411 Generalized anxiety disorder: Secondary | ICD-10-CM

## 2012-01-31 DIAGNOSIS — I10 Essential (primary) hypertension: Secondary | ICD-10-CM

## 2012-01-31 DIAGNOSIS — M549 Dorsalgia, unspecified: Secondary | ICD-10-CM

## 2012-01-31 MED ORDER — LISINOPRIL-HYDROCHLOROTHIAZIDE 20-25 MG PO TABS: 1.0000 | ORAL_TABLET | Freq: Every day | ORAL | Status: DC

## 2012-01-31 MED ORDER — CYCLOBENZAPRINE HCL 10 MG PO TABS: 10.0000 mg | ORAL_TABLET | Freq: Three times a day (TID) | ORAL | Status: DC | PRN

## 2012-01-31 MED ORDER — MELOXICAM 15 MG PO TABS: 15.0000 mg | ORAL_TABLET | Freq: Every day | ORAL | Status: DC

## 2012-01-31 MED ORDER — PAROXETINE HCL 20 MG PO TABS: 20.0000 mg | ORAL_TABLET | ORAL | Status: DC

## 2012-01-31 MED ORDER — DIAZEPAM 5 MG PO TABS: 5.0000 mg | ORAL_TABLET | Freq: Two times a day (BID) | ORAL | Status: AC | PRN

## 2012-01-31 MED ORDER — HYDROCODONE-ACETAMINOPHEN 5-500 MG PO TABS: 1.0000 | ORAL_TABLET | Freq: Four times a day (QID) | ORAL | Status: DC | PRN

## 2012-01-31 NOTE — Patient Instructions (Signed)
Limit your sodium (Salt) intake    It is important that you exercise regularly, at least 20 minutes 3 to 4 times per week.  If you develop chest pain or shortness of breath seek  medical attention.  Return in 6 months for follow-up  

## 2012-01-31 NOTE — Progress Notes (Signed)
  Subjective:    Patient ID: Dustin Henry, male    DOB: [birth year], 41 y.o.   MRN: [id number]  HPI  41 year old patient who is seen today for his six-month followup. He has chronic intermittent low back pain that is aggravated by his current job. He is very active physically and landscaping and does have frequent episodes of low back pain. He has been out of Mobic which has been helpful in the past. He also complains of headaches these have been problematic as well he does note increasing job-related stress. He has also been evaluated by his dentist and endodontist do to a trauma involving his right front tooth. He has had a root canal and has had persistent pain related to trauma. This has aggravated his headaches. He has a history of chronic anxiety and depression. He also complains of some tingling involving his left arm of one week's duration. No motor weakness. He was given a prednisone Dosepak about one week ago by his dentist. He also complains of cough and chest congestion of several days duration. He feels generally unwell    Review of Systems  Constitutional: Positive for fatigue. Negative for fever, chills and appetite change.  HENT: Positive for postnasal drip and sinus pressure. Negative for hearing loss, ear pain, congestion, sore throat, trouble swallowing, neck stiffness, dental problem, voice change and tinnitus.   Eyes: Negative for pain, discharge and visual disturbance.  Respiratory: Positive for cough. Negative for chest tightness, wheezing and stridor.   Cardiovascular: Negative for chest pain, palpitations and leg swelling.  Gastrointestinal: Negative for nausea, vomiting, abdominal pain, diarrhea, constipation, blood in stool and abdominal distention.  Genitourinary: Negative for urgency, hematuria, flank pain, discharge, difficulty urinating and genital sores.  Musculoskeletal: Positive for back pain. Negative for myalgias, joint swelling, arthralgias and gait  problem.  Skin: Negative for rash.  Neurological: Positive for weakness and numbness. Negative for dizziness, syncope, speech difficulty and headaches.  Hematological: Negative for adenopathy. Does not bruise/bleed easily.  Psychiatric/Behavioral: Negative for behavioral problems and dysphoric mood. The patient is not nervous/anxious.        Objective:   Physical Exam  Constitutional: He is oriented to person, place, and time. He appears well-developed.       Blood pressure is normal. He appears slightly anxious but in no acute distress  HENT:  Head: Normocephalic.  Right Ear: External ear normal.  Left Ear: External ear normal.  Eyes: Conjunctivae and EOM are normal.  Neck: Normal range of motion.  Cardiovascular: Normal rate and normal heart sounds.   Pulmonary/Chest: Breath sounds normal.  Abdominal: Bowel sounds are normal.  Musculoskeletal: Normal range of motion. He exhibits no edema and no tenderness.  Neurological: He is alert and oriented to person, place, and time.       Examination left arm revealed normal strength reflexes are somewhat depressed but equal bilaterally  Psychiatric: He has a normal mood and affect. His behavior is normal.          Assessment & Plan:   Hypertension stable. We'll continue present regimen medication refilled Chronic intermittent low back pain. His Mobic and analgesics refilled URI Chronic intermittent headache Anxiety disorder  Low-salt diet encouraged medications refilled He'll follow up with his dentist

## 2012-02-05 IMAGING — CT CT ABD-PELV W/ CM
2 of 5 series · 16 of 46 positions shown, 18 images · IV contrast (agent unspecified)
Comparison: Acute abdominal series 09/04/2010

CLINICAL DATA: Diffuse abdominal pain.  Fever.

CT ABDOMEN AND PELVIS WITH CONTRAST
TECHNIQUE: Multidetector CT imaging of the abdomen and pelvis was
performed following the standard protocol during bolus
administration of intravenous contrast.
Contrast: 100 ml [id number]-1BB

[Series 2: rtn a/p with · axial · 0.74mm/px · z∈[-494,-68]mm · 13 of 97 slices shown, 15 images]
[im 6/97  soft-tissue]
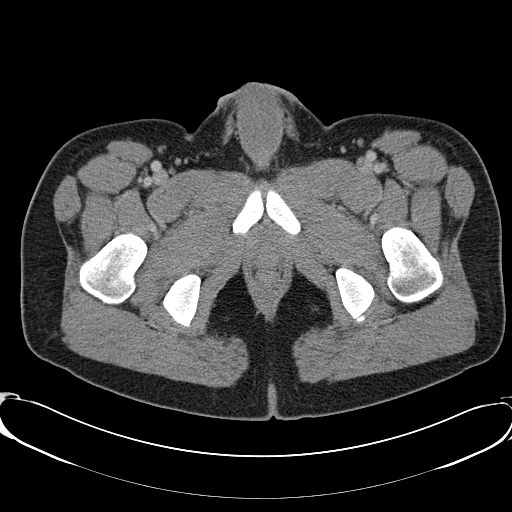
[im 6/97  bone]
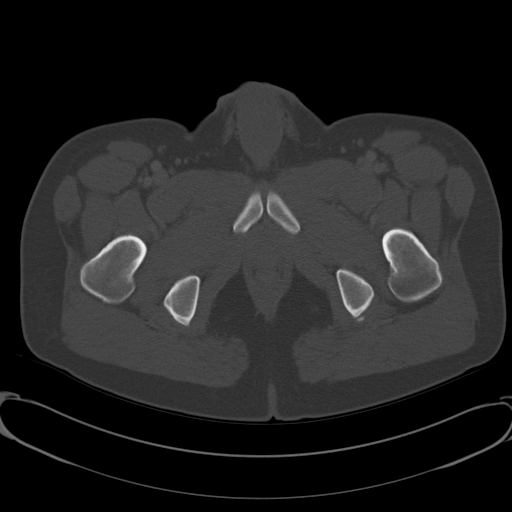
[im 16/97  soft-tissue]
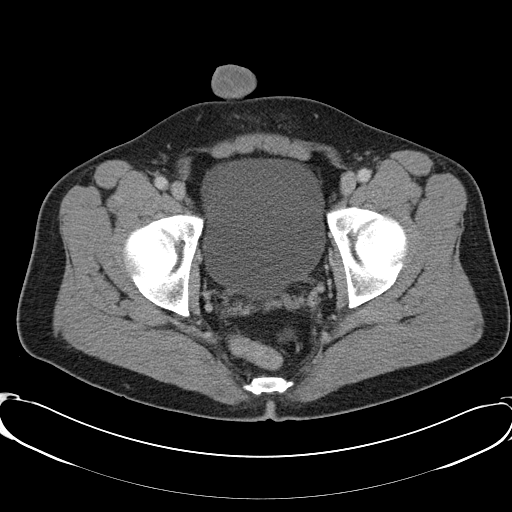
[im 21/97  soft-tissue]
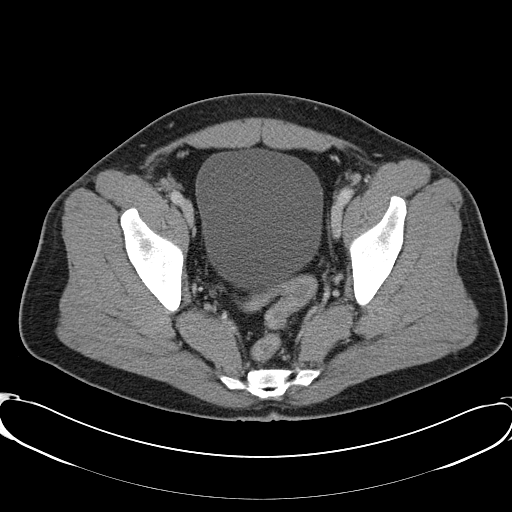
[im 26/97  soft-tissue]
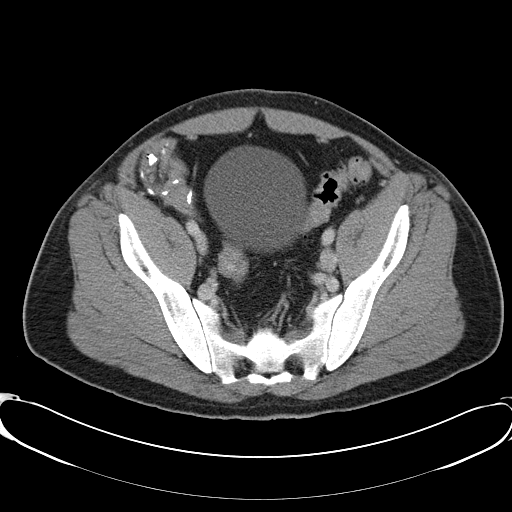
[im 36/97  soft-tissue]
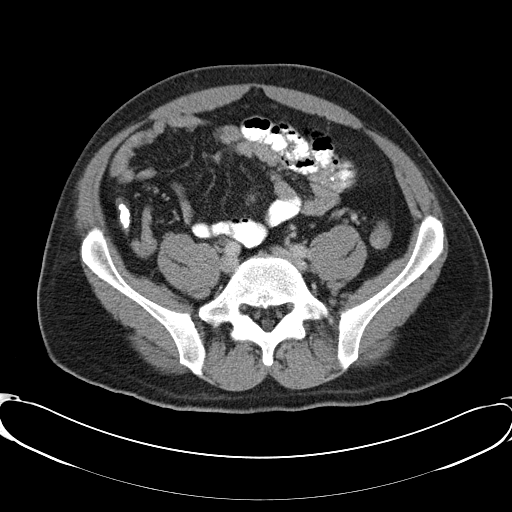
[im 41/97  soft-tissue]
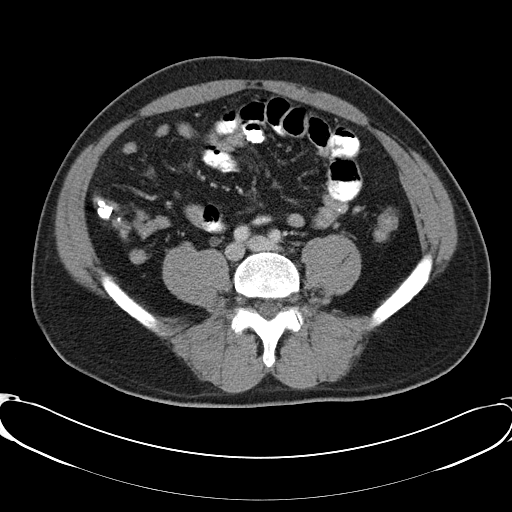
[im 51/97  soft-tissue]
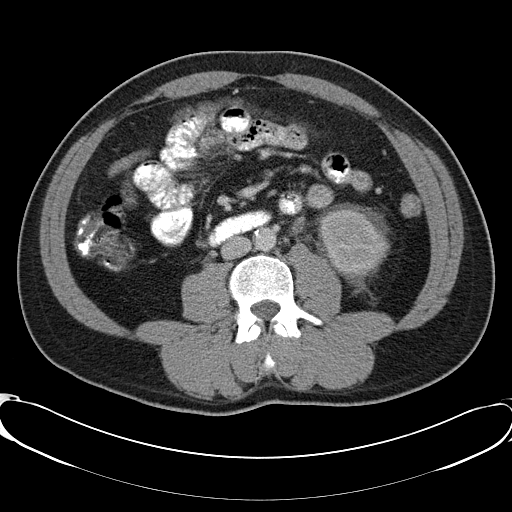
[im 56/97  soft-tissue]
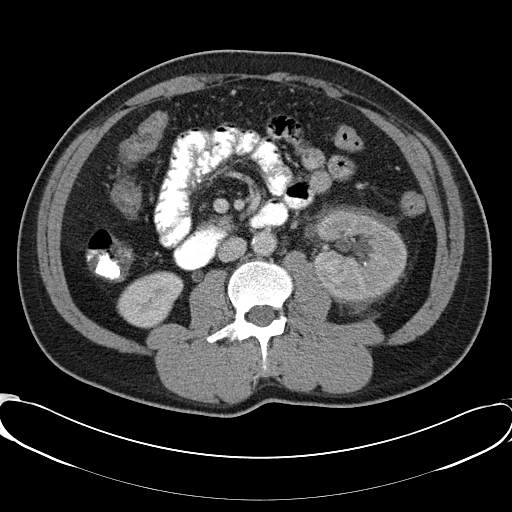
[im 61/97  soft-tissue]
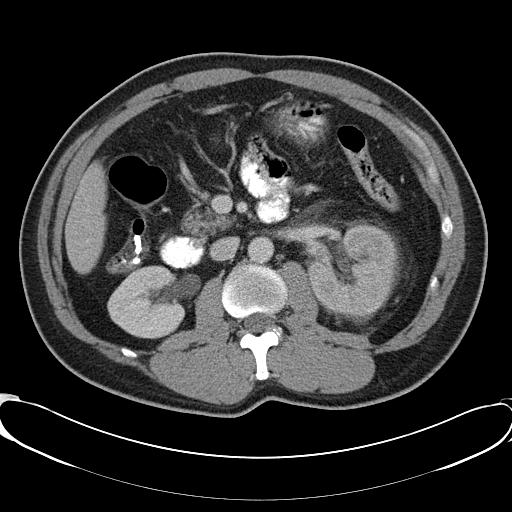
[im 61/97  bone]
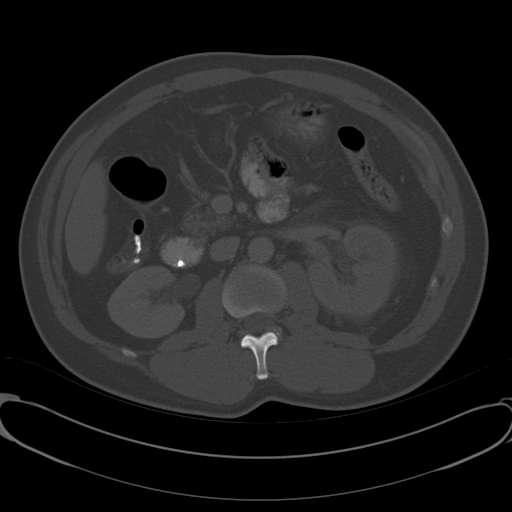
[im 71/97  soft-tissue]
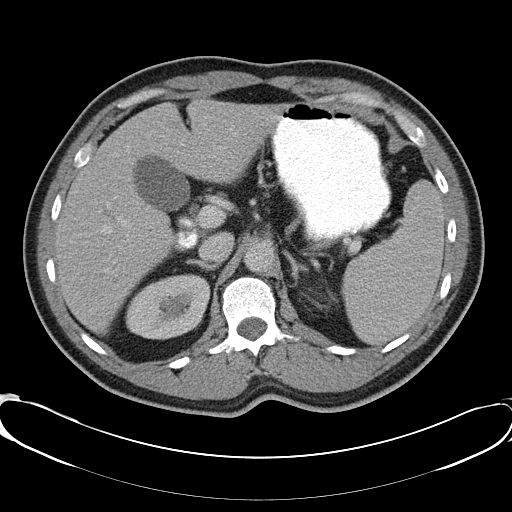
[im 76/97  soft-tissue]
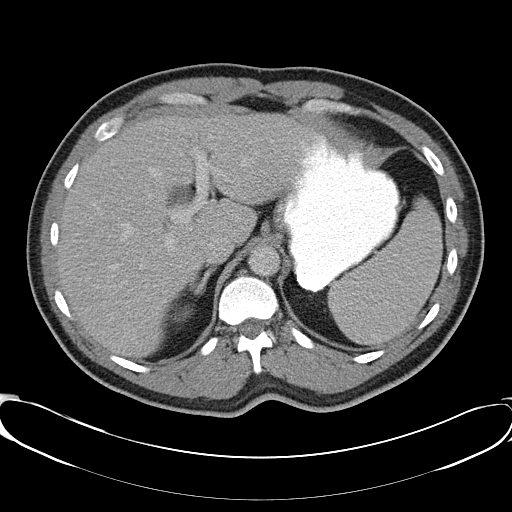
[im 81/97  soft-tissue]
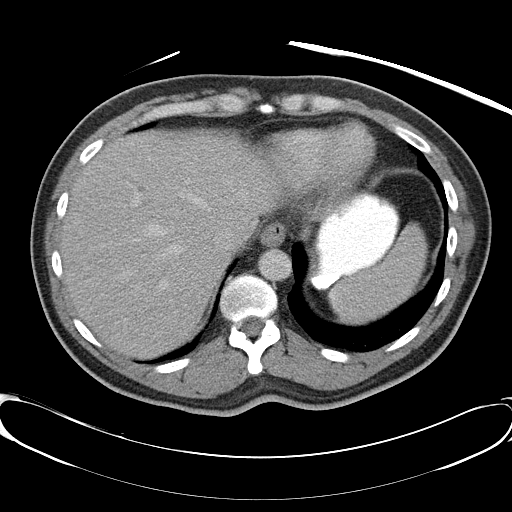
[im 91/97  soft-tissue]
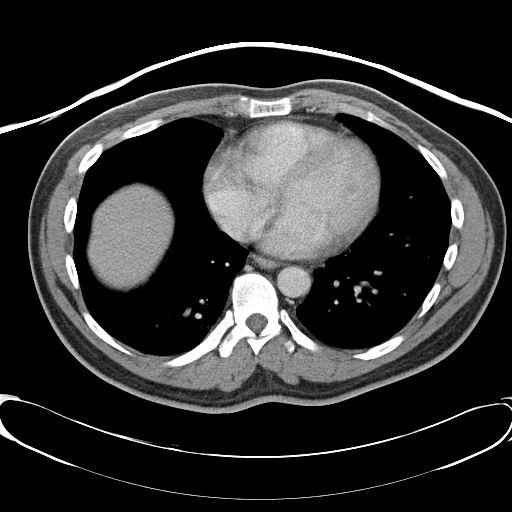

[Series 602: <mpr thick range> · coronal · 0.94mm/px · 3 of 95 slices shown]
[im 32/95  soft-tissue]
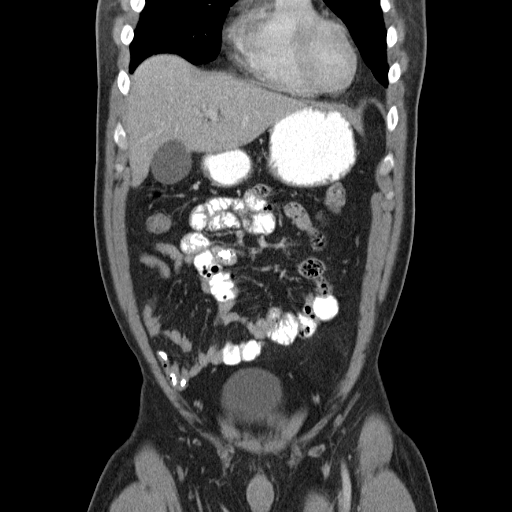
[im 42/95  soft-tissue]
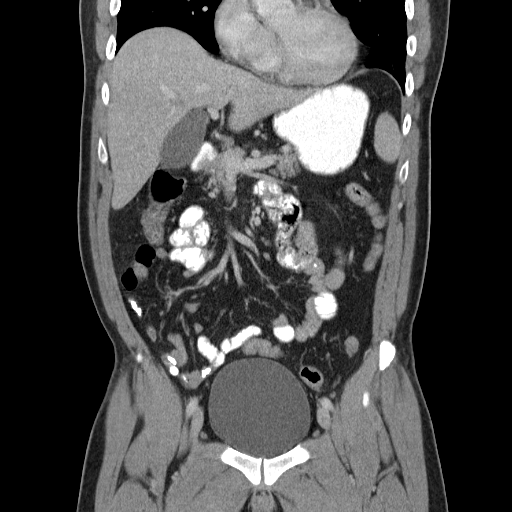
[im 53/95  soft-tissue]
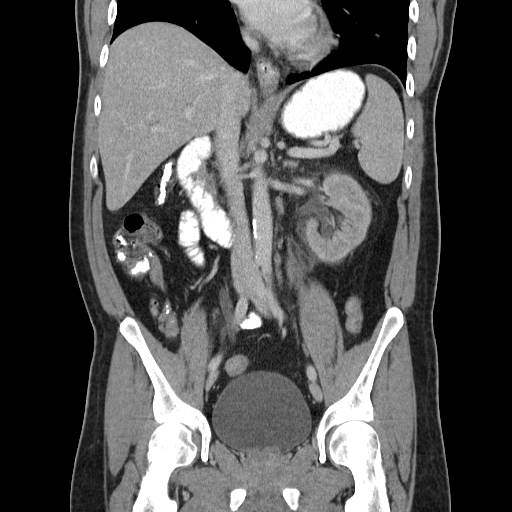

[16 of 46 positions shown; findings below may reference images not displayed]

FINDINGS: There is bibasilar atelectasis.  No pleural effusion or
consolidation.

There is a 2 mm obstructing proximal LEFT ureteral stone.
Additionally, there are multiple stones within the left renal
collecting system (at least seven intrarenal stones, the largest
measures 3 mm).

The left kidney is asymmetrically prominent in size compared to the
right and has delayed cortical enhancement relative to the right
kidney.  Additionally, on delayed images, there is no excretion of
contrast into the left kidney.  There is marked perinephric
stranding on the left.

There is also an obstructing stone in the proximal to mid RIGHT
ureter that measures 5 mm results in proximal moderate
hydroureteronephrosis.  There is normal enhancement of the right
kidney.  Normal excretion of contrast into the right kidney on
delayed images.

There are multiple right intrarenal calculi, in the lower pole.
There are least four lower pole stones, each measuring
approximately 2 mm.  The

The liver, gallbladder, spleen, adrenal glands, and pancreas are
within normal limits.  Bowel loops normal in caliber.  Urinary
bladder is mildly distended demonstrates normal wall thickness.  No
bladder or distal ureteral stones are identified.  No urethral
stone is seen.  Negative for lymphadenopathy.

Abdominal aorta is normal in caliber contains scattered
atherosclerotic calcification.

No acute bony abnormalities identified.
IMPRESSION: 1.  2 mm proximal LEFT ureteral stone results in proximal moderate
left hydroureteronephrosis and marked perinephric stranding on the
left.
2.  Decreased enhancement and no contrast excretion into the
collecting system of the left kidney on delayed images is noted.
This can be secondary to high degree of obstruction, and/or
associated pyelonephritis.
3.  5 mm RIGHT mid right ureteral stone results and right moderate
hydroureteronephrosis.  There is normal enhancement of the right
kidney.
4.  Bilateral intrarenal calculi.

Findings discussed with Dr. Caasha by telephone.

## 2012-02-10 ENCOUNTER — Telehealth: Payer: Self-pay

## 2012-02-10 NOTE — Telephone Encounter (Signed)
Spoke with pharm- they did not recv 'phone-in ' on 01/31/12 all others were. Gave RF over the phone

## 2012-02-10 NOTE — Telephone Encounter (Signed)
No-these were just filled

## 2012-02-10 NOTE — Telephone Encounter (Signed)
[facility] [address] for vicodin Last seen 01/31/12 - 6 mos rov  Last written 01/31/12 #60   0RF Please advise

## 2012-02-21 ENCOUNTER — Other Ambulatory Visit: Payer: Self-pay | Admitting: Internal Medicine

## 2012-02-21 MED ORDER — PROMETHAZINE HCL 25 MG PO TABS: 25.0000 mg | ORAL_TABLET | Freq: Three times a day (TID) | ORAL | Status: DC | PRN

## 2012-02-21 NOTE — Telephone Encounter (Signed)
30

## 2012-02-21 NOTE — Telephone Encounter (Signed)
Please advise Last seen 01/22/12

## 2012-02-21 NOTE — Telephone Encounter (Signed)
Pt called and said that stomach has been upset. Pt is req a renewal of script for promethazine (PHENERGAN) [facility] MG [facility] [address] [phone]

## 2012-04-16 ENCOUNTER — Other Ambulatory Visit: Payer: Self-pay

## 2012-04-16 MED ORDER — HYDROCODONE-ACETAMINOPHEN 5-500 MG PO TABS
1.0000 | ORAL_TABLET | Freq: Four times a day (QID) | ORAL | Status: DC | PRN
Start: 2012-04-16 — End: 2012-05-22

## 2012-04-16 NOTE — Telephone Encounter (Signed)
60

## 2012-04-16 NOTE — Telephone Encounter (Signed)
[facility]in 5-500 Last seen 01/31/12 6 mos rov  Last filled 02/10/12 # 60 0RF

## 2012-04-16 NOTE — Telephone Encounter (Signed)
done

## 2012-05-22 ENCOUNTER — Other Ambulatory Visit: Payer: Self-pay

## 2012-05-22 MED ORDER — HYDROCODONE-ACETAMINOPHEN 5-500 MG PO TABS: 1.0000 | ORAL_TABLET | Freq: Four times a day (QID) | ORAL | Status: DC | PRN

## 2012-05-22 NOTE — Telephone Encounter (Signed)
Fax [facility] vic[facility]in 5-500 Last seen 01/31/12 6 mos rov  Last written 04/16/12 #60 0RF

## 2012-05-22 NOTE — Telephone Encounter (Signed)
ok 

## 2012-06-19 ENCOUNTER — Other Ambulatory Visit: Payer: Self-pay

## 2012-06-19 MED ORDER — HYDROCODONE-ACETAMINOPHEN 5-500 MG PO TABS: 1.0000 | ORAL_TABLET | Freq: Four times a day (QID) | ORAL | Status: DC | PRN

## 2012-06-19 NOTE — Telephone Encounter (Signed)
Fax [facility] [address] for vicodin 5-500 Last seen 01/31/12 back apin  Last written 05/22/12 # 60  0RF Please advise

## 2012-06-19 NOTE — Telephone Encounter (Signed)
60

## 2012-06-19 NOTE — Telephone Encounter (Signed)
called in 

## 2012-06-29 ENCOUNTER — Other Ambulatory Visit: Payer: Self-pay

## 2012-06-29 MED ORDER — DIAZEPAM 5 MG PO TABS: 5.0000 mg | ORAL_TABLET | Freq: Two times a day (BID) | ORAL | Status: AC | PRN

## 2012-06-29 NOTE — Telephone Encounter (Signed)
ok 

## 2012-06-29 NOTE — Telephone Encounter (Signed)
[facility] diazepam 5mg  Last seen 01/31/12 6 mos rov  Last written 01/31/12 # 60 1Rf Please advise

## 2012-07-16 ENCOUNTER — Other Ambulatory Visit: Payer: Self-pay

## 2012-07-16 MED ORDER — HYDROCODONE-ACETAMINOPHEN 5-500 MG PO TABS
1.0000 | ORAL_TABLET | Freq: Four times a day (QID) | ORAL | Status: DC | PRN
Start: 2012-07-16 — End: 2012-08-14

## 2012-07-31 ENCOUNTER — Ambulatory Visit: Payer: BC Managed Care – PPO | Admitting: Internal Medicine

## 2012-07-31 DIAGNOSIS — Z0289 Encounter for other administrative examinations: Secondary | ICD-10-CM

## 2012-08-13 ENCOUNTER — Other Ambulatory Visit: Payer: Self-pay

## 2012-08-13 NOTE — Telephone Encounter (Signed)
Last seen 01/31/12 6 mos rov Last written 07/16/12 # 60   0Rf Please advise

## 2012-08-14 MED ORDER — HYDROCODONE-ACETAMINOPHEN 5-500 MG PO TABS: 1.0000 | ORAL_TABLET | Freq: Four times a day (QID) | ORAL | Status: DC | PRN

## 2012-08-14 NOTE — Telephone Encounter (Signed)
60

## 2012-08-14 NOTE — Telephone Encounter (Signed)
Called in.

## 2012-09-04 ENCOUNTER — Encounter: Payer: Self-pay | Admitting: Internal Medicine

## 2012-09-04 ENCOUNTER — Ambulatory Visit (INDEPENDENT_AMBULATORY_CARE_PROVIDER_SITE_OTHER): Payer: BC Managed Care – PPO | Admitting: Internal Medicine

## 2012-09-04 VITALS — BP 130/90 | Temp 98.0°F | Wt 187.0 lb

## 2012-09-04 DIAGNOSIS — I10 Essential (primary) hypertension: Secondary | ICD-10-CM

## 2012-09-04 DIAGNOSIS — M549 Dorsalgia, unspecified: Secondary | ICD-10-CM

## 2012-09-04 DIAGNOSIS — G43909 Migraine, unspecified, not intractable, without status migrainosus: Secondary | ICD-10-CM

## 2012-09-04 MED ORDER — LISINOPRIL-HYDROCHLOROTHIAZIDE 20-25 MG PO TABS: 1.0000 | ORAL_TABLET | Freq: Every day | ORAL | Status: DC

## 2012-09-04 MED ORDER — CYCLOBENZAPRINE HCL 10 MG PO TABS: 10.0000 mg | ORAL_TABLET | Freq: Three times a day (TID) | ORAL | Status: DC | PRN

## 2012-09-04 MED ORDER — MELOXICAM 15 MG PO TABS: 15.0000 mg | ORAL_TABLET | Freq: Every day | ORAL | Status: AC

## 2012-09-04 MED ORDER — PAROXETINE HCL 20 MG PO TABS: 20.0000 mg | ORAL_TABLET | ORAL | Status: DC

## 2012-09-04 MED ORDER — HYDROCODONE-ACETAMINOPHEN 5-500 MG PO TABS: 1.0000 | ORAL_TABLET | Freq: Four times a day (QID) | ORAL | Status: DC | PRN

## 2012-09-04 NOTE — Patient Instructions (Signed)
Limit your sodium (Salt) intake  Back Exercises Back exercises help treat and prevent back injuries. The goal of back exercises is to increase the strength of your abdominal and back muscles and the flexibility of your back. These exercises should be started when you no longer have back pain. Back exercises include:  Pelvic Tilt. Lie on your back with your knees bent. Tilt your pelvis until the lower part of your back is against the floor. Hold this position 5 to 10 sec and repeat 5 to 10 times.   Knee to Chest. Pull first 1 knee up against your chest and hold for 20 to 30 seconds, repeat this with the other knee, and then both knees. This may be done with the other leg straight or bent, whichever feels better.   Sit-Ups or Curl-Ups. Bend your knees 90 degrees. Start with tilting your pelvis, and do a partial, slow sit-up, lifting your trunk only 30 to 45 degrees off the floor. Take at least 2 to 3 seconds for each sit-up. Do not do sit-ups with your knees out straight. If partial sit-ups are difficult, simply do the above but with only tightening your abdominal muscles and holding it as directed.   Hip-Lift. Lie on your back with your knees flexed 90 degrees. Push down with your feet and shoulders as you raise your hips a couple inches off the floor; hold for 10 seconds, repeat 5 to 10 times.   Back arches. Lie on your stomach, propping yourself up on bent elbows. Slowly press on your hands, causing an arch in your low back. Repeat 3 to 5 times. Any initial stiffness and discomfort should lessen with repetition over time.   Shoulder-Lifts. Lie face down with arms beside your body. Keep hips and torso pressed to floor as you slowly lift your head and shoulders off the floor.  Do not overdo your exercises, especially in the beginning. Exercises may cause you some mild back discomfort which lasts for a few minutes; however, if the pain is more severe, or lasts for more than 15 minutes, do not continue  exercises until you see your caregiver. Improvement with exercise therapy for back problems is slow.   See your caregivers for assistance with developing a proper back exercise program. Document Released: 01/02/2005 Document Revised: 11/14/2011 Document Reviewed: 11/25/2005 [facility] Patient Information 2012 [place], [place].Back Injury Prevention Back injuries are extremely painful, difficult to heal, and have an effect on everything you do. After suffering one back injury, you are much more likely to experience another later on. It is important to learn how to avoid injuring or re-injuring your back. You can learn proper lifting techniques and the basics of back safety, saving yourself a lot of pain and a lifetime of back problems.   WHY DO BACK INJURIES OCCUR?  The lower part of the back holds most of the body's weight.   Every time you bend over, lift a heavy object, or sit leaning forward, you put stress on your spine.   Over time, the discs between your vertebrae can start to wear out and become damaged.   Repetitive bending and lifting can quickly cause back problems.   Even leaning forward while sitting at a desk or table can eventually cause damage and pain.  The following are contributing factors, and related tips to prevent back injury. POOR PHYSICAL CONDITION, EXTRA WEIGHT, AND INACTIVITY  Your stomach muscles provide a lot of the support needed by your back.   If you have  weak stomach muscles, your back may not get all the support it needs, especially when you are lifting or carrying heavy objects.   Good general physical condition is important for preventing strains, sprains, and other injuries. Exercise regularly and try to develop good tone in your abdominal (stomach) muscles.   The more you weigh, the more stress is placed on your back every time you bend over, at a ratio of 10:1. For every pound of weight, 10 times that amount of pressure is placed on the back.   Regular  aerobic exercise (walking, jogging, biking, swimming) has been shown to decrease back injuries.   Exercises that increase balance and strength can decrease your risk of falling and injuring your back or breaking bones. Exercises such as tai chi and yoga, or any weight-bearing exercise that challenges your balance, are good for increasing balance and strength.   Stretching and strengthening exercises can also reduce the risk of injuries.   Maintaining your ideal body weight is also important for having a healthy back.  DIET  In order to keep your spine strong, you will need to get enough calcium and vitamin D in your diet, to help prevent osteoporosis (bones becoming full of pores and weak, with age or diet deficiency).   Osteoporosis is responsible for many bone fractures that lead to back pain.   Calcium can be found in dairy products, green, leafy vegetables, and products with calcium added (fortified), like some orange juices.   Although your skin makes vitamin D when you are in the sun, you can also get it from your diet.   Vitamin D is found in milk and foods that have this vitamin added (fortified).   Many adults do not get enough calcium and vitamin D in their diet.   You should talk to your caregiver about how much calcium and vitamin D you need per day. Consider taking a nutritional supplement or a multivitamin.  POOR POSTURE  Sit and stand up straight.   It is best to try to maintain the back in its natural, slight "S" shaped curve.   Avoid leaning forward (unsupported) when you sit, or hunching over while you are standing.   A chair with good lumbar (low back) support helps protect the back when you sit.   If you work at a desk, sit close to your work so you do not need to lean over. Keep your chin tucked in. Keep your neck drawn back and elbows bent at 90 degrees to your spine.   Sit high and close to the steering wheel when you drive. Add a lumbar support to your car  seat, if needed.   Avoid sitting or standing too long in 1 position. Sitting can be hard on the lower back. Take breaks, get up, stretch and walk around frequently (at least every hour).   Avoid sleeping in an unnatural position. Sleep on your side (with knees slightly bent), or on your back (with a pillow under your knees). Do not sleep on your stomach.  OVEREXERTION AND SUDDEN TWISTS OR MOVEMENTS  Avoid working in odd, uncomfortable positions, such as when gardening, kneeling, or doing tasks that require you to bend over for long periods of time.   Strech before exerting: If you know you will be doing work that might stress your back, take the time to stretch and loosen your muscles before starting, just like an athlete before a workout. This will help you avoid painful strains and sprains.  Slow down: If you are doing a lot of heavy, repetitive lifting, work slowly. Allow yourself more recovery time between lifts. Over working your back will cost you more time later, when you need medical attention or when you find every movement painful.   It is important to recognize your physical limitations and abilities.   Do not hesitate to say, "This is too heavy for me to lift alone."   Many people have injured their backs because they were afraid to ask for help. Ask for help!   Certain actions, motions and movements are more likely than others to cause or contribute to back injuries.   Avoid heavy lifting, especially repetitive lifting over a long period of time.   Avoid twisting at the waist, while lifting or holding a heavy load.   Avoid reaching and lifting over your head, across a table, or for an object on an elevated surface. Do not lean forward and lift heavy objects that are far from your body.   Concentrate on keeping your shoulders pulled back and your low back straight.   Never bend over without bending your knees.   Bend at your knees, instead of your back, when you pick up  objects. Instead of using your back like a crane, let your legs do the work.   Try not to lift heavy things higher than your waist, or reach for objects on shelves above your head.   When lifting:   Take a balanced stance, with your feet about shoulder width apart. One foot can be behind the object and the other next to it.   Squat down to lift the object, but keep your heels off the floor.   Get as close to the object to be lifted as you can.   Lift gradually, without jerking, using (tightening) your leg, abdominal and buttock muscles. Keep the load as close to you as possible.   Keep your back straight.   Keep your chin tucked in, to maintain a relatively straight back and neck line.   Once you are standing, change directions by pointing your feet in the direction you want to go and turning your whole body. Avoid twisting at your waist while carrying a load.   When you put a load down, use these same guidelines in reverse.   Reduce the amount of weight lifted. If you're moving books, it is better to load several small boxes rather than 1 heavy box.   Use handles and lifting straps.   Do not turn or twist while holding an object. Turn at your feet, not your back.   Avoid lifting or carrying objects with awkward or odd shapes. Get help if the shape is too awkward for you to lift and move by yourself.   The use of wide elastic belts, that can be worn and tightened to "pull in" lumbar and abdominal muscles, to prevent low back pain, is controversial.  STRESS  Tense muscles are more vulnerable to strains and spasms.   Mental stress that you experience is directed to your muscles, neck, and back.   Find constructive ways, including exercise and other relaxation techniques, to decrease the stress you experience and decrease its impact on your body.   Massage can help reduce the stress built up in your muscles and back.  ENVIRONMENTAL CAUSES AND SOLUTIONS  You could injure your  back from slipping on a wet floor or ice. Avoid wet and newly mopped surfaces, and make sure that ice around  your home and office walkways is removed or treated.   Sleeping on a mattress that is too soft or too hard can hurt your back. If too soft, consider inserting a large plywood board between your mattress and box spring, or replacing your mattress. Mattresses and box springs should be replaced every 10 to 15 years, depending on the product.   Place, store, and position objects up off the floor. That way, you will not need to reach down to pick them up again.   Raise or lower shelves, so you do not need to reach, or twist your back, neck, and shoulders.   Put heavier objects on shelves at waist level, and lighter objects on lower or higher shelves.   Use carts and dollies to move objects, rather than carrying them yourself.   It is better to push a cart, dolly, lawnmower, or wheelbarrow, than it is to pull it. If you do need to pull it, force yourself to tighten your stomach muscles and try to maintain good body posture.   Use cranes, hoists, a lift table, or other lift-assist devices whenever you can.  Your caregiver can provide additional information about preventing back (and other injuries) when you seek care, to avoid a first or recurrent back injury. If you injure your back, visit your caregiver so you can be assessed and treated.   Document Released: 01/02/2005 Document Revised: 11/14/2011 Document Reviewed: 09/25/2009 [facility] Patient Information 2012 [place], [place].Back Pain, Adult Low back pain is very common. About 1 in 5 people have back pain. The cause of low back pain is rarely dangerous. The pain often gets better over time. About half of people with a sudden onset of back pain feel better in just 2 weeks. About 8 in 10 people feel better by 6 weeks.   CAUSES Some common causes of back pain include:  Strain of the muscles or ligaments supporting the spine.   Wear and tear  (degeneration) of the spinal discs.   Arthritis.   Direct injury to the back.  DIAGNOSIS Most of the time, the direct cause of low back pain is not known. However, back pain can be treated effectively even when the exact cause of the pain is unknown. Answering your caregiver's questions about your overall health and symptoms is one of the most accurate ways to make sure the cause of your pain is not dangerous. If your caregiver needs more information, he or she may order lab work or imaging tests (X-rays or MRIs). However, even if imaging tests show changes in your back, this usually does not require surgery. HOME CARE INSTRUCTIONS For many people, back pain returns. Since low back pain is rarely dangerous, it is often a condition that people can learn to manage on their own.    Remain active. It is stressful on the back to sit or stand in one place. Do not sit, drive, or stand in one place for more than 30 minutes at a time. Take short walks on level surfaces as soon as pain allows. Try to increase the length of time you walk each day.   Do not stay in bed. Resting more than 1 or 2 days can delay your recovery.   Do not avoid exercise or work. Your body is made to move. It is not dangerous to be active, even though your back may hurt. Your back will likely heal faster if you return to being active before your pain is gone.   Pay attention to  your body when you  bend and lift. Many people have less discomfort when lifting if they bend their knees, keep the load close to their bodies, and avoid twisting. Often, the most comfortable positions are those that put less stress on your recovering back.   Find a comfortable position to sleep. Use a firm mattress and lie on your side with your knees slightly bent. If you lie on your back, put a pillow under your knees.   Only take over-the-counter or prescription medicines as directed by your caregiver. Over-the-counter medicines to reduce pain and  inflammation are often the most helpful. Your caregiver may prescribe muscle relaxant drugs. These medicines help dull your pain so you can more quickly return to your normal activities and healthy exercise.   Put ice on the injured area.   Put ice in a plastic bag.   Place a towel between your skin and the bag.   Leave the ice on for 15 to 20 minutes, 3 to 4 times a day for the first 2 to 3 days. After that, ice and heat may be alternated to reduce pain and spasms.   Ask your caregiver about trying back exercises and gentle massage. This may be of some benefit.   Avoid feeling anxious or stressed. Stress increases muscle tension and can worsen back pain. It is important to recognize when you are anxious or stressed and learn ways to manage it. Exercise is a great option.  SEEK MEDICAL CARE IF:  You have pain that is not relieved with rest or medicine.   You have pain that does not improve in 1 week.   You have new symptoms.   You are generally not feeling well.  SEEK IMMEDIATE MEDICAL CARE IF:    You have pain that radiates from your back into your legs.   You develop new bowel or bladder control problems.   You have unusual weakness or numbness in your arms or legs.   You develop nausea or vomiting.   You develop abdominal pain.   You feel faint.  Document Released: 11/25/2005 Document Revised: 11/14/2011 Document Reviewed: 04/15/2011 [facility] Patient Information 2012 [place], [place].

## 2012-09-04 NOTE — Progress Notes (Signed)
Subjective:    Patient ID: Dustin Henry, male    DOB: [birth year], 41 y.o.   MRN: [id number]  HPI  41 year old patient who is seen today for his six-month followup. He has treated hypertension he also has a history of migraine headaches. He does complain of headaches but these seem to be work and stress related. He is headache free on weekends and vacations. His main complaint is low back pain. This has been a chronic complaint his job does require considerable physical activities. Again he feels that stress in his work aggravate the low back pain;  he does quite well without recurrent back pain when he is able to rest  Past Medical History  Diagnosis Date  . ALLERGIC RHINITIS 08/18/2007  . ANXIETY 02/09/2010  . BACK PAIN 03/09/2010  . HYPERLIPIDEMIA 02/12/2010  . HYPERTENSION 02/09/2010  . MIGRAINE HEADACHE 08/18/2007  . IBS (irritable bowel syndrome)     History   Social History  . Marital Status: Married    Spouse Name: N/A    Number of Children: N/A  . Years of Education: N/A   Occupational History  . Not on file.   Social History Main Topics  . Smoking status: Former Smoker    Quit date: 12/09/2004  . Smokeless tobacco: Never Used  . Alcohol Use: Yes  . Drug Use: No  . Sexually Active: Not on file   Other Topics Concern  . Not on file   Social History Narrative  . No narrative on file    No past surgical history on file.  No family history on file.  Allergies  Allergen Reactions  . Doxycycline     REACTION: nausea  . Penicillins     REACTION: unspecified    Current Outpatient Prescriptions on File Prior to Visit  Medication Sig Dispense Refill  . promethazine (PHENERGAN) 25 MG tablet Take 1 tablet (25 mg total) by mouth every 8 (eight) hours as needed.  30 tablet  0  . DISCONTD: lisinopril-hydrochlorothiazide (PRINZIDE,ZESTORETIC) 20-25 MG per tablet Take 1 tablet by mouth daily.  90 tablet  4  . DISCONTD: PARoxetine (PAXIL) 20 MG tablet Take 1 tablet (20 mg  total) by mouth every morning.  90 tablet  4    BP 130/90  Temp 98 F (36.7 C) (Oral)  Wt 187 lb (84.823 kg)      Review of Systems  Constitutional: Negative for fever, chills, appetite change and fatigue.  HENT: Negative for hearing loss, ear pain, congestion, sore throat, trouble swallowing, neck stiffness, dental problem, voice change and tinnitus.   Eyes: Negative for pain, discharge and visual disturbance.  Respiratory: Negative for cough, chest tightness, wheezing and stridor.   Cardiovascular: Negative for chest pain, palpitations and leg swelling.  Gastrointestinal: Negative for nausea, vomiting, abdominal pain, diarrhea, constipation, blood in stool and abdominal distention.  Genitourinary: Negative for urgency, hematuria, flank pain, discharge, difficulty urinating and genital sores.  Musculoskeletal: Positive for back pain. Negative for myalgias, joint swelling, arthralgias and gait problem.  Skin: Negative for rash.  Neurological: Positive for headaches. Negative for dizziness, syncope, speech difficulty, weakness and numbness.  Hematological: Negative for adenopathy. Does not bruise/bleed easily.  Psychiatric/Behavioral: Negative for behavioral problems and dysphoric mood. The patient is nervous/anxious.        Objective:   Physical Exam  Constitutional: He is oriented to person, place, and time. He appears well-developed.  HENT:  Head: Normocephalic.  Right Ear: External ear normal.  Left Ear: External ear normal.  Eyes: Conjunctivae normal and EOM are normal.  Neck: Normal range of motion.  Cardiovascular: Normal rate and normal heart sounds.   Pulmonary/Chest: Breath sounds normal.  Abdominal: Bowel sounds are normal.  Musculoskeletal: Normal range of motion. He exhibits no edema and no tenderness.  Neurological: He is alert and oriented to person, place, and time.  Psychiatric: He has a normal mood and affect. His behavior is normal.            Assessment & Plan:   Hypertension well controlled Back pain. Information concerning back pain prevention and exercises dispense. Medications refilled Muscle contraction headaches  Recheck when necessary or in 6 months

## 2012-09-18 ENCOUNTER — Other Ambulatory Visit: Payer: Self-pay | Admitting: Internal Medicine

## 2012-09-23 ENCOUNTER — Other Ambulatory Visit: Payer: Self-pay | Admitting: Internal Medicine

## 2012-11-12 ENCOUNTER — Other Ambulatory Visit: Payer: Self-pay | Admitting: Internal Medicine

## 2012-11-12 MED ORDER — HYDROCODONE-ACETAMINOPHEN 5-500 MG PO TABS: 1.0000 | ORAL_TABLET | Freq: Four times a day (QID) | ORAL | Status: DC | PRN

## 2012-11-12 NOTE — Telephone Encounter (Signed)
Left message on voicemail. Rx refill for Vicodin was called into pharmacy.

## 2012-11-12 NOTE — Telephone Encounter (Signed)
Pt needs refill on vicodin call into [facility] [address]

## 2012-11-12 NOTE — Addendum Note (Signed)
Addended by: Jimmye Norman on: 11/12/2012 05:10 PM   Modules accepted: Orders

## 2012-11-13 NOTE — Telephone Encounter (Signed)
Left detailed message on pt's answering machine that Rx he requested was sent to the pharmacy.

## 2012-12-14 ENCOUNTER — Other Ambulatory Visit: Payer: Self-pay | Admitting: Internal Medicine

## 2013-01-15 ENCOUNTER — Other Ambulatory Visit: Payer: Self-pay | Admitting: Internal Medicine

## 2013-01-15 MED ORDER — HYDROCODONE-ACETAMINOPHEN 5-325 MG PO TABS: 1.0000 | ORAL_TABLET | Freq: Four times a day (QID) | ORAL | Status: DC | PRN

## 2013-02-18 ENCOUNTER — Other Ambulatory Visit: Payer: Self-pay | Admitting: Internal Medicine

## 2013-03-05 ENCOUNTER — Ambulatory Visit (INDEPENDENT_AMBULATORY_CARE_PROVIDER_SITE_OTHER): Payer: BC Managed Care – PPO | Admitting: Internal Medicine

## 2013-03-05 ENCOUNTER — Encounter: Payer: Self-pay | Admitting: Internal Medicine

## 2013-03-05 VITALS — BP 130/74 | HR 102 | Temp 98.1°F | Resp 18 | Wt 182.0 lb

## 2013-03-05 DIAGNOSIS — G4733 Obstructive sleep apnea (adult) (pediatric): Secondary | ICD-10-CM

## 2013-03-05 DIAGNOSIS — F411 Generalized anxiety disorder: Secondary | ICD-10-CM

## 2013-03-05 DIAGNOSIS — M549 Dorsalgia, unspecified: Secondary | ICD-10-CM

## 2013-03-05 DIAGNOSIS — I10 Essential (primary) hypertension: Secondary | ICD-10-CM

## 2013-03-05 NOTE — Patient Instructions (Addendum)
Return in 6 months for follow-up  Please check your blood pressure on a regular basis.  If it is consistently greater than 150/90, please make an office appointment.  Sleep study as discussedImpingement Syndrome, Rotator Cuff, Bursitis with Rehab Impingement syndrome is a condition that involves inflammation of the tendons of the rotator cuff and the subacromial bursa, that causes pain in the shoulder. The rotator cuff consists of four tendons and muscles that control much of the shoulder and upper arm function. The subacromial bursa is a fluid filled sac that helps reduce friction between the rotator cuff and one of the bones of the shoulder (acromion). Impingement syndrome is usually an overuse injury that causes swelling of the bursa (bursitis), swelling of the tendon (tendonitis), and/or a tear of the tendon (strain). Strains are classified into three categories. Grade 1 strains cause pain, but the tendon is not lengthened. Grade 2 strains include a lengthened ligament, due to the ligament being stretched or partially ruptured. With grade 2 strains there is still function, although the function may be decreased. Grade 3 strains include a complete tear of the tendon or muscle, and function is usually impaired. SYMPTOMS   Pain around the shoulder, often at the outer portion of the upper arm.  Pain that gets worse with shoulder function, especially when reaching overhead or lifting.  Sometimes, aching when not using the arm.  Pain that wakes you up at night.  Sometimes, tenderness, swelling, warmth, or redness over the affected area.  Loss of strength.  Limited motion of the shoulder, especially reaching behind the back (to the back pocket or to unhook bra) or across your body.  Crackling sound (crepitation) when moving the arm.  Biceps tendon pain and inflammation (in the front of the shoulder). Worse when bending the elbow or lifting. CAUSES  Impingement syndrome is often an overuse  injury, in which chronic (repetitive) motions cause the tendons or bursa to become inflamed. A strain occurs when a force is paced on the tendon or muscle that is greater than it can withstand. Common mechanisms of injury include: Stress from sudden increase in duration, frequency, or intensity of training.  Direct hit (trauma) to the shoulder.  Aging, erosion of the tendon with normal use.  Bony bump on shoulder (acromial spur). RISK INCREASES WITH:  Contact sports (football, wrestling, boxing).  Throwing sports (baseball, tennis, volleyball).  Weightlifting and bodybuilding.  Heavy labor.  Previous injury to the rotator cuff, including impingement.  Poor shoulder strength and flexibility.  Failure to warm up properly before activity.  Inadequate protective equipment.  Old age.  Bony bump on shoulder (acromial spur). PREVENTION   Warm up and stretch properly before activity.  Allow for adequate recovery between workouts.  Maintain physical fitness:  Strength, flexibility, and endurance.  Cardiovascular fitness.  Learn and use proper exercise technique. PROGNOSIS  If treated properly, impingement syndrome usually goes away within 6 weeks. Sometimes surgery is required.  RELATED COMPLICATIONS   Longer healing time if not properly treated, or if not given enough time to heal.  Recurring symptoms, that result in a chronic condition.  Shoulder stiffness, frozen shoulder, or loss of motion.  Rotator cuff tendon tear.  Recurring symptoms, especially if activity is resumed too soon, with overuse, with a direct blow, or when using poor technique. TREATMENT  Treatment first involves the use of ice and medicine, to reduce pain and inflammation. The use of strengthening and stretching exercises may help reduce pain with activity. These exercises may  be performed at home or with a therapist. If non-surgical treatment is unsuccessful after more than 6 months, surgery may be  advised. After surgery and rehabilitation, activity is usually possible in 3 months.  MEDICATION  If pain medicine is needed, nonsteroidal anti-inflammatory medicines (aspirin and ibuprofen), or other minor pain relievers (acetaminophen), are often advised.  Do not take pain medicine for 7 days before surgery.  Prescription pain relievers may be given, if your caregiver thinks they are needed. Use only as directed and only as much as you need.  Corticosteroid injections may be given by your caregiver. These injections should be reserved for the most serious cases, because they may only be given a certain number of times. HEAT AND COLD  Cold treatment (icing) should be applied for 10 to 15 minutes every 2 to 3 hours for inflammation and pain, and immediately after activity that aggravates your symptoms. Use ice packs or an ice massage.  Heat treatment may be used before performing stretching and strengthening activities prescribed by your caregiver, physical therapist, or athletic trainer. Use a heat pack or a warm water soak. SEEK MEDICAL CARE IF:   Symptoms get worse or do not improve in 4 to 6 weeks, despite treatment.  New, unexplained symptoms develop. (Drugs used in treatment may produce side effects.) EXERCISES  RANGE OF MOTION (ROM) AND STRETCHING EXERCISES - Impingement Syndrome (Rotator Cuff  Tendinitis, Bursitis) These exercises may help you when beginning to rehabilitate your injury. Your symptoms may go away with or without further involvement from your physician, physical therapist or athletic trainer. While completing these exercises, remember:   Restoring tissue flexibility helps normal motion to return to the joints. This allows healthier, less painful movement and activity.  An effective stretch should be held for at least 30 seconds.  A stretch should never be painful. You should only feel a gentle lengthening or release in the stretched tissue. STRETCH  Flexion,  Standing  Stand with good posture. With an underhand grip on your right / left hand, and an overhand grip on the opposite hand, grasp a broomstick or cane so that your hands are a little more than shoulder width apart.  Keeping your right / left elbow straight and shoulder muscles relaxed, push the stick with your opposite hand, to raise your right / left arm in front of your body and then overhead. Raise your arm until you feel a stretch in your right / left shoulder, but before you have increased shoulder pain.  Try to avoid shrugging your right / left shoulder as your arm rises, by keeping your shoulder blade tucked down and toward your mid-back spine. Hold for __________ seconds.  Slowly return to the starting position. Repeat __________ times. Complete this exercise __________ times per day. STRETCH  Abduction, Supine  Lie on your back. With an underhand grip on your right / left hand and an overhand grip on the opposite hand, grasp a broomstick or cane so that your hands are a little more than shoulder width apart.  Keeping your right / left elbow straight and your shoulder muscles relaxed, push the stick with your opposite hand, to raise your right / left arm out to the side of your body and then overhead. Raise your arm until you feel a stretch in your right / left shoulder, but before you have increased shoulder pain.  Try to avoid shrugging your right / left shoulder as your arm rises, by keeping your shoulder blade tucked down and  toward your mid-back spine. Hold for __________ seconds.  Slowly return to the starting position. Repeat __________ times. Complete this exercise __________ times per day. ROM  Flexion, Active-Assisted  Lie on your back. You may bend your knees for comfort.  Grasp a broomstick or cane so your hands are about shoulder width apart. Your right / left hand should grip the end of the stick, so that your hand is positioned "thumbs-up," as if you were about to  shake hands.  Using your healthy arm to lead, raise your right / left arm overhead, until you feel a gentle stretch in your shoulder. Hold for __________ seconds.  Use the stick to assist in returning your right / left arm to its starting position. Repeat __________ times. Complete this exercise __________ times per day.  ROM - Internal Rotation, Supine   Lie on your back on a firm surface. Place your right / left elbow about 60 degrees away from your side. Elevate your elbow with a folded towel, so that the elbow and shoulder are the same height.  Using a broomstick or cane and your strong arm, pull your right / left hand toward your body until you feel a gentle stretch, but no increase in your shoulder pain. Keep your shoulder and elbow in place throughout the exercise.  Hold for __________ seconds. Slowly return to the starting position. Repeat __________ times. Complete this exercise __________ times per day. STRETCH - Internal Rotation  Place your right / left hand behind your back, palm up.  Throw a towel or belt over your opposite shoulder. Grasp the towel with your right / left hand.  While keeping an upright posture, gently pull up on the towel, until you feel a stretch in the front of your right / left shoulder.  Avoid shrugging your right / left shoulder as your arm rises, by keeping your shoulder blade tucked down and toward your mid-back spine.  Hold for __________ seconds. Release the stretch, by lowering your healthy hand. Repeat __________ times. Complete this exercise __________ times per day. ROM - Internal Rotation   Using an underhand grip, grasp a stick behind your back with both hands.  While standing upright with good posture, slide the stick up your back until you feel a mild stretch in the front of your shoulder.  Hold for __________ seconds. Slowly return to your starting position. Repeat __________ times. Complete this exercise __________ times per day.    STRETCH  Posterior Shoulder Capsule   Stand or sit with good posture. Grasp your right / left elbow and draw it across your chest, keeping it at the same height as your shoulder.  Pull your elbow, so your upper arm comes in closer to your chest. Pull until you feel a gentle stretch in the back of your shoulder.  Hold for __________ seconds. Repeat __________ times. Complete this exercise __________ times per day. STRENGTHENING EXERCISES - Impingement Syndrome (Rotator Cuff Tendinitis, Bursitis) These exercises may help you when beginning to rehabilitate your injury. They may resolve your symptoms with or without further involvement from your physician, physical therapist or athletic trainer. While completing these exercises, remember:  Muscles can gain both the endurance and the strength needed for everyday activities through controlled exercises.  Complete these exercises as instructed by your physician, physical therapist or athletic trainer. Increase the resistance and repetitions only as guided.  You may experience muscle soreness or fatigue, but the pain or discomfort you are trying to eliminate should never  worsen during these exercises. If this pain does get worse, stop and make sure you are following the directions exactly. If the pain is still present after adjustments, discontinue the exercise until you can discuss the trouble with your clinician.  During your recovery, avoid activity or exercises which involve actions that place your injured hand or elbow above your head or behind your back or head. These positions stress the tissues which you are trying to heal. STRENGTH - Scapular Depression and Adduction   With good posture, sit on a firm chair. Support your arms in front of you, with pillows, arm rests, or on a table top. Have your elbows in line with the sides of your body.  Gently draw your shoulder blades down and toward your mid-back spine. Gradually increase the tension,  without tensing the muscles along the top of your shoulders and the back of your neck.  Hold for __________ seconds. Slowly release the tension and relax your muscles completely before starting the next repetition.  After you have practiced this exercise, remove the arm support and complete the exercise in standing as well as sitting position. Repeat __________ times. Complete this exercise __________ times per day.  STRENGTH - Shoulder Abductors, Isometric  With good posture, stand or sit about 4-6 inches from a wall, with your right / left side facing the wall.  Bend your right / left elbow. Gently press your right / left elbow into the wall. Increase the pressure gradually, until you are pressing as hard as you can, without shrugging your shoulder or increasing any shoulder discomfort.  Hold for __________ seconds.  Release the tension slowly. Relax your shoulder muscles completely before you begin the next repetition. Repeat __________ times. Complete this exercise __________ times per day.  STRENGTH - External Rotators, Isometric  Keep your right / left elbow at your side and bend it 90 degrees.  Step into a door frame so that the outside of your right / left wrist can press against the door frame without your upper arm leaving your side.  Gently press your right / left wrist into the door frame, as if you were trying to swing the back of your hand away from your stomach. Gradually increase the tension, until you are pressing as hard as you can, without shrugging your shoulder or increasing any shoulder discomfort.  Hold for __________ seconds.  Release the tension slowly. Relax your shoulder muscles completely before you begin the next repetition. Repeat __________ times. Complete this exercise __________ times per day.  STRENGTH - Supraspinatus   Stand or sit with good posture. Grasp a __________ weight, or an exercise band or tubing, so that your hand is "thumbs-up," like you  are shaking hands.  Slowly lift your right / left arm in a "V" away from your thigh, diagonally into the space between your side and straight ahead. Lift your hand to shoulder height or as far as you can, without increasing any shoulder pain. At first, many people do not lift their hands above shoulder height.  Avoid shrugging your right / left shoulder as your arm rises, by keeping your shoulder blade tucked down and toward your mid-back spine.  Hold for __________ seconds. Control the descent of your hand, as you slowly return to your starting position. Repeat __________ times. Complete this exercise __________ times per day.  STRENGTH - External Rotators  Secure a rubber exercise band or tubing to a fixed object (table, pole) so that it is at the  same height as your right / left elbow when you are standing or sitting on a firm surface.  Stand or sit so that the secured exercise band is at your uninjured side.  Bend your right / left elbow 90 degrees. Place a folded towel or small pillow under your right / left arm, so that your elbow is a few inches away from your side.  Keeping the tension on the exercise band, pull it away from your body, as if pivoting on your elbow. Be sure to keep your body steady, so that the movement is coming only from your rotating shoulder.  Hold for __________ seconds. Release the tension in a controlled manner, as you return to the starting position. Repeat __________ times. Complete this exercise __________ times per day.  STRENGTH - Internal Rotators   Secure a rubber exercise band or tubing to a fixed object (table, pole) so that it is at the same height as your right / left elbow when you are standing or sitting on a firm surface.  Stand or sit so that the secured exercise band is at your right / left side.  Bend your elbow 90 degrees. Place a folded towel or small pillow under your right / left arm so that your elbow is a few inches away from your  side.  Keeping the tension on the exercise band, pull it across your body, toward your stomach. Be sure to keep your body steady, so that the movement is coming only from your rotating shoulder.  Hold for __________ seconds. Release the tension in a controlled manner, as you return to the starting position. Repeat __________ times. Complete this exercise __________ times per day.  STRENGTH - Scapular Protractors, Standing   Stand arms length away from a wall. Place your hands on the wall, keeping your elbows straight.  Begin by dropping your shoulder blades down and toward your mid-back spine.  To strengthen your protractors, keep your shoulder blades down, but slide them forward on your rib cage. It will feel as if you are lifting the back of your rib cage away from the wall. This is a subtle motion and can be challenging to complete. Ask your caregiver for further instruction, if you are not sure you are doing the exercise correctly.  Hold for __________ seconds. Slowly return to the starting position, resting the muscles completely before starting the next repetition. Repeat __________ times. Complete this exercise __________ times per day. STRENGTH - Scapular Protractors, Supine  Lie on your back on a firm surface. Extend your right / left arm straight into the air while holding a __________ weight in your hand.  Keeping your head and back in place, lift your shoulder off the floor.  Hold for __________ seconds. Slowly return to the starting position, and allow your muscles to relax completely before starting the next repetition. Repeat __________ times. Complete this exercise __________ times per day. STRENGTH - Scapular Protractors, Quadruped  Get onto your hands and knees, with your shoulders directly over your hands (or as close as you can be, comfortably).  Keeping your elbows locked, lift the back of your rib cage up into your shoulder blades, so your mid-back rounds out. Keep  your neck muscles relaxed.  Hold this position for __________ seconds. Slowly return to the starting position and allow your muscles to relax completely before starting the next repetition. Repeat __________ times. Complete this exercise __________ times per day.  STRENGTH - Scapular Retractors  Secure a rubber  exercise band or tubing to a fixed object (table, pole), so that it is at the height of your shoulders when you are either standing, or sitting on a firm armless chair.  With a palm down grip, grasp an end of the band in each hand. Straighten your elbows and lift your hands straight in front of you, at shoulder height. Step back, away from the secured end of the band, until it becomes tense.  Squeezing your shoulder blades together, draw your elbows back toward your sides, as you bend them. Keep your upper arms lifted away from your body throughout the exercise.  Hold for __________ seconds. Slowly ease the tension on the band, as you reverse the directions and return to the starting position. Repeat __________ times. Complete this exercise __________ times per day. STRENGTH - Shoulder Extensors   Secure a rubber exercise band or tubing to a fixed object (table, pole) so that it is at the height of your shoulders when you are either standing, or sitting on a firm armless chair.  With a thumbs-up grip, grasp an end of the band in each hand. Straighten your elbows and lift your hands straight in front of you, at shoulder height. Step back, away from the secured end of the band, until it becomes tense.  Squeezing your shoulder blades together, pull your hands down to the sides of your thighs. Do not allow your hands to go behind you.  Hold for __________ seconds. Slowly ease the tension on the band, as you reverse the directions and return to the starting position. Repeat __________ times. Complete this exercise __________ times per day.  STRENGTH - Scapular Retractors and External  Rotators   Secure a rubber exercise band or tubing to a fixed object (table, pole) so that it is at the height as your shoulders, when you are either standing, or sitting on a firm armless chair.  With a palm down grip, grasp an end of the band in each hand. Bend your elbows 90 degrees and lift your elbows to shoulder height, at your sides. Step back, away from the secured end of the band, until it becomes tense.  Squeezing your shoulder blades together, rotate your shoulders so that your upper arms and elbows remain stationary, but your fists travel upward to head height.  Hold for __________ seconds. Slowly ease the tension on the band, as you reverse the directions and return to the starting position. Repeat __________ times. Complete this exercise __________ times per day.  STRENGTH - Scapular Retractors and External Rotators, Rowing   Secure a rubber exercise band or tubing to a fixed object (table, pole) so that it is at the height of your shoulders, when you are either standing, or sitting on a firm armless chair.  With a palm down grip, grasp an end of the band in each hand. Straighten your elbows and lift your hands straight in front of you, at shoulder height. Step back, away from the secured end of the band, until it becomes tense.  Step 1: Squeeze your shoulder blades together. Bending your elbows, draw your hands to your chest, as if you are rowing a boat. At the end of this motion, your hands and elbow should be at shoulder height and your elbows should be out to your sides.  Step 2: Rotate your shoulders, to raise your hands above your head. Your forearms should be vertical and your upper arms should be horizontal.  Hold for __________ seconds. Slowly ease the  tension on the band, as you reverse the directions and return to the starting position. Repeat __________ times. Complete this exercise __________ times per day.  STRENGTH  Scapular Depressors  Find a sturdy chair without  wheels, such as a dining room chair.  Keeping your feet on the floor, and your hands on the chair arms, lift your bottom up from the seat, and lock your elbows.  Keeping your elbows straight, allow gravity to pull your body weight down. Your shoulders will rise toward your ears.  Raise your body against gravity by drawing your shoulder blades down your back, shortening the distance between your shoulders and ears. Although your feet should always maintain contact with the floor, your feet should progressively support less body weight, as you get stronger.  Hold for __________ seconds. In a controlled and slow manner, lower your body weight to begin the next repetition. Repeat __________ times. Complete this exercise __________ times per day.  Document Released: 11/25/2005 Document Revised: 02/17/2012 Document Reviewed: 03/09/2009 [facility] Patient Information 2013 [place], [place]. Sleep Apnea  Sleep apnea is a sleep disorder characterized by abnormal pauses in breathing while you sleep. When your breathing pauses, the level of oxygen in your blood decreases. This causes you to move out of deep sleep and into light sleep. As a result, your quality of sleep is poor, and the system that carries your blood throughout your body (cardiovascular system) experiences stress. If sleep apnea remains untreated, the following conditions can develop:  High blood pressure (hypertension).  Coronary artery disease.  Inability to achieve or maintain an erection (impotence).  Impairment of your thought process (cognitive dysfunction). There are three types of sleep apnea: 1. Obstructive sleep apnea Pauses in breathing during sleep because of a blocked airway. 2. Central sleep apnea Pauses in breathing during sleep because the area of the brain that controls your breathing does not send the correct signals to the muscles that control breathing. 3. Mixed sleep apnea A combination of both obstructive and central  sleep apnea. RISK FACTORS The following risk factors can increase your risk of developing sleep apnea:  Being overweight.  Smoking.  Having narrow passages in your nose and throat.  Being of older age.  Being male.  Alcohol use.  Sedative and tranquilizer use.  Ethnicity. Among individuals younger than 35 years, African Americans are at increased risk of sleep apnea. SYMPTOMS   Difficulty staying asleep.  Daytime sleepiness and fatigue.  Loss of energy.  Irritability.  Loud, heavy snoring.  Morning headaches.  Trouble concentrating.  Forgetfulness.  Decreased interest in sex. DIAGNOSIS  In order to diagnose sleep apnea, your caregiver will perform a physical examination. Your caregiver may suggest that you take a home sleep test. Your caregiver may also recommend that you spend the night in a sleep lab. In the sleep lab, several monitors record information about your heart, lungs, and brain while you sleep. Your leg and arm movements and blood oxygen level are also recorded. TREATMENT The following actions may help to resolve mild sleep apnea:  Sleeping on your side.   Using a decongestant if you have nasal congestion.   Avoiding the use of depressants, including alcohol, sedatives, and narcotics.   Losing weight and modifying your diet if you are overweight. There also are devices and treatments to help open your airway:  Oral appliances. These are custom-made mouthpieces that shift your lower jaw forward and slightly open your bite. This opens your airway.  Devices that create positive airway  pressure. This positive pressure "splints" your airway open to help you breathe better during sleep. The following devices create positive airway pressure:  Continuous positive airway pressure (CPAP) device. The CPAP device creates a continuous level of air pressure with an air pump. The air is delivered to your airway through a mask while you sleep. This continuous  pressure keeps your airway open.  Nasal expiratory positive airway pressure (EPAP) device. The EPAP device creates positive air pressure as you exhale. The device consists of single-use valves, which are inserted into each nostril and held in place by adhesive. The valves create very little resistance when you inhale but create much more resistance when you exhale. That increased resistance creates the positive airway pressure. This positive pressure while you exhale keeps your airway open, making it easier to breath when you inhale again.  Bilevel positive airway pressure (BPAP) device. The BPAP device is used mainly in patients with central sleep apnea. This device is similar to the CPAP device because it also uses an air pump to deliver continuous air pressure through a mask. However, with the BPAP machine, the pressure is set at two different levels. The pressure when you exhale is lower than the pressure when you inhale.  Surgery. Typically, surgery is only done if you cannot comply with less invasive treatments or if the less invasive treatments do not improve your condition. Surgery involves removing excess tissue in your airway to create a wider passage way. Document Released: 11/15/2002 Document Revised: 05/26/2012 Document Reviewed: 04/02/2012 [facility] Patient Information 2013 [place], [place].

## 2013-03-05 NOTE — Progress Notes (Signed)
Subjective:    Patient ID: Dustin Henry, male    DOB: Mar 29, 1971, 42 y.o.   MRN: [id number]  HPI  42 year old patient who has treated hypertension who is seen today for his six-month followup. His blood pressure has been controlled. He has chronic headaches that occur during the work week but is free of headaches on weekends. He has chronic low back pain throughout the seven-day week. He is on analgesics including anti-inflammatories with marginal benefit. For the past 2 months he has had right shoulder pain that occurred following an evening of bowling.Marland Kitchen His work does involve a considerable physical activity. He describes himself as a heavy snorer with restless sleep. He states that he awakes fairly refreshed but tires early in the day and goes to bed at 9 PM daily  Past Medical History  Diagnosis Date  . ALLERGIC RHINITIS 08/18/2007  . ANXIETY 02/09/2010  . BACK PAIN 03/09/2010  . HYPERLIPIDEMIA 02/12/2010  . HYPERTENSION 02/09/2010  . MIGRAINE HEADACHE 08/18/2007  . IBS (irritable bowel syndrome)     History   Social History  . Marital Status: Married    Spouse Name: N/A    Number of Children: N/A  . Years of Education: N/A   Occupational History  . Not on file.   Social History Main Topics  . Smoking status: Former Smoker    Quit date: 12/09/2004  . Smokeless tobacco: Never Used  . Alcohol Use: Yes  . Drug Use: No  . Sexually Active: Not on file   Other Topics Concern  . Not on file   Social History Narrative  . No narrative on file    History reviewed. No pertinent past surgical history.  History reviewed. No pertinent family history.  Allergies  Allergen Reactions  . Doxycycline     REACTION: nausea  . Penicillins     REACTION: unspecified    Current Outpatient Prescriptions on File Prior to Visit  Medication Sig Dispense Refill  . cyclobenzaprine (FLEXERIL) 10 MG tablet Take 1 tablet (10 mg total) by mouth 3 (three) times daily as needed.  50 tablet  4  .  diazepam (VALIUM) 5 MG tablet TAKE 1 TABLET BY MOUTH EVERY 12 HOURS AS NEEDED FOR ANXIETY  60 tablet  1  . HYDROcodone-acetaminophen (NORCO/VICODIN) 5-325 MG per tablet TAKE 1 TABLET BY MOUTH EVERY 6 HOURS AS NEEDED FOR PAIN  60 tablet  0  . lisinopril-hydrochlorothiazide (PRINZIDE,ZESTORETIC) 20-25 MG per tablet Take 1 tablet by mouth daily.  90 tablet  4  . meloxicam (MOBIC) 15 MG tablet Take 1 tablet (15 mg total) by mouth daily.  90 tablet  2  . PARoxetine (PAXIL) 20 MG tablet Take 1 tablet (20 mg total) by mouth every morning.  90 tablet  4  . promethazine (PHENERGAN) 25 MG tablet Take 1 tablet (25 mg total) by mouth every 8 (eight) hours as needed.  30 tablet  0   No current facility-administered medications on file prior to visit.    BP 130/74  Pulse 102  Temp(Src) 98.1 F (36.7 C) (Oral)  Resp 18  Wt 182 lb (82.555 kg)  BMI 26.5 kg/m2  SpO2 97%      Review of Systems  Constitutional: Negative for fever, chills, appetite change and fatigue.  HENT: Negative for hearing loss, ear pain, congestion, sore throat, trouble swallowing, neck stiffness, dental problem, voice change and tinnitus.   Eyes: Negative for pain, discharge and visual disturbance.  Respiratory: Negative for cough, chest tightness, wheezing  and stridor.   Cardiovascular: Negative for chest pain, palpitations and leg swelling.  Gastrointestinal: Negative for nausea, vomiting, abdominal pain, diarrhea, constipation, blood in stool and abdominal distention.  Genitourinary: Negative for urgency, hematuria, flank pain, discharge, difficulty urinating and genital sores.  Musculoskeletal: Positive for back pain. Negative for myalgias, joint swelling, arthralgias and gait problem.  Skin: Negative for rash.  Neurological: Positive for headaches. Negative for dizziness, syncope, speech difficulty, weakness and numbness.  Hematological: Negative for adenopathy. Does not bruise/bleed easily.  Psychiatric/Behavioral:  Positive for sleep disturbance. Negative for behavioral problems and dysphoric mood. The patient is nervous/anxious.        Objective:   Physical Exam  Constitutional: He is oriented to person, place, and time. He appears well-developed.  HENT:  Head: Normocephalic.  Right Ear: External ear normal.  Left Ear: External ear normal.  Low hanging soft palate  Eyes: Conjunctivae and EOM are normal.  Neck: Normal range of motion.  Cardiovascular: Normal rate and normal heart sounds.   Pulmonary/Chest: Breath sounds normal.  Abdominal: Bowel sounds are normal.  Musculoskeletal: Normal range of motion. He exhibits no edema and no tenderness.  Neurological: He is alert and oriented to person, place, and time.  Psychiatric: He has a normal mood and affect. His behavior is normal.          Assessment & Plan:    HTN- controlled Chr LBP Chr  HAs R/o OSA  Home sleep study R shoulder pain-  Will try some exercises and refer ortho if unimproved

## 2013-03-17 ENCOUNTER — Telehealth: Payer: Self-pay | Admitting: *Deleted

## 2013-03-17 DIAGNOSIS — R0683 Snoring: Secondary | ICD-10-CM

## 2013-03-17 DIAGNOSIS — G4733 Obstructive sleep apnea (adult) (pediatric): Secondary | ICD-10-CM

## 2013-03-17 NOTE — Telephone Encounter (Signed)
Left message on voicemail to call office. Pt left message on my voicemail regarding he got a letter from insurance saying sleep study was denied.

## 2013-03-17 NOTE — Telephone Encounter (Signed)
Message copied by Jimmye Norman on Wed Mar 17, 2013  9:07 AM ------      Message from: Gordy Savers      Created: Fri Mar 12, 2013 12:17 PM       Please notify patient of the denial by insurance      ----- Message -----         From: Jimmye Norman, LPN         Sent: 03/12/2013   9:17 AM           To: Gordy Savers, MD            See message below and please advise.      ----- Message -----         From: Cherlyn Labella         Sent: 03/12/2013   8:18 AM           To: Jimmye Norman, LPN            Pt sleep study was denied by insurance. They fell it is not a medical necessity. Please advise             ------

## 2013-03-17 NOTE — Telephone Encounter (Signed)
Left message on voicemail to call office.  

## 2013-03-18 ENCOUNTER — Other Ambulatory Visit: Payer: Self-pay | Admitting: Internal Medicine

## 2013-03-18 NOTE — Telephone Encounter (Signed)
Left message on voicemail to call office. Rx called into pharmacy.  

## 2013-03-18 NOTE — Telephone Encounter (Signed)
Spoke to pt  regarding sleep study denied and that we might be able to do a Pulmonary consult and have them evaluate snoring. I will have to check with Dr. Kirtland Bouchard and he is out of the office till Tues. If he okays the referral I will send it and let him know. Pt verbalized understanding.

## 2013-03-24 NOTE — Telephone Encounter (Signed)
Discussed pt with Dr. Kirtland Bouchard and asked if we could try sending pt to Pulmonary for consult to evaluate OSA and snoring. Dr. Amador Cunas gave verbal order for Pulmonary consult. Order done.  Left detailed message on voicemail that Pulmonary consult order was done and someone will be contacting him.

## 2013-03-29 ENCOUNTER — Other Ambulatory Visit: Payer: Self-pay | Admitting: Internal Medicine

## 2013-04-08 ENCOUNTER — Other Ambulatory Visit: Payer: Self-pay | Admitting: Internal Medicine

## 2013-04-16 ENCOUNTER — Institutional Professional Consult (permissible substitution): Payer: BC Managed Care – PPO | Admitting: Pulmonary Disease

## 2013-05-04 ENCOUNTER — Other Ambulatory Visit: Payer: Self-pay | Admitting: Internal Medicine

## 2013-05-06 NOTE — Telephone Encounter (Signed)
ok 

## 2013-05-07 ENCOUNTER — Encounter: Payer: Self-pay | Admitting: Pulmonary Disease

## 2013-05-07 ENCOUNTER — Ambulatory Visit (INDEPENDENT_AMBULATORY_CARE_PROVIDER_SITE_OTHER): Payer: BC Managed Care – PPO | Admitting: Pulmonary Disease

## 2013-05-07 VITALS — BP 138/80 | HR 98 | Temp 98.5°F | Ht 70.0 in | Wt 180.6 lb

## 2013-05-07 DIAGNOSIS — G478 Other sleep disorders: Secondary | ICD-10-CM

## 2013-05-07 NOTE — Patient Instructions (Addendum)
Will schedule for a sleep study to evaluate for a movement disorder of sleep, and possibly sleep apnea.  Will arrange followup with me once the results are available.

## 2013-05-07 NOTE — Assessment & Plan Note (Signed)
The patient has a history.  Restless sleep that includes loud snoring, frequent awakenings, as well as abnormal behavior such as yelling and punching.  He has also been noted to have abnormal movements during sleep.  It is unclear whether he has obstructive sleep apnea or not.  At this point, he will need a sleep study to rule out the possibility of sleep apnea and also a REM behavior disorder or other movement disorder.  The patient is agreeable to this approach.

## 2013-05-07 NOTE — Progress Notes (Signed)
Subjective:    Patient ID: Dustin Henry, male    DOB: 20-Sep-1971, 42 y.o.   MRN: [id number]  HPI The patient is a 42 year old male who I've been asked to see for abnormal sleep.  He has been told by his wife that he has very restless sleep, with frequent leg and arm movements, punching at times, and also yelling.  He has also been noted to have mild snoring, but she has never mentioned an abnormal breathing pattern during sleep.  He denies choking arousals.  He has frequent awakenings at night, and is not rested in the mornings upon arising.  He denies sleepiness during the day, but has a very busy job where he is always moving.  The patient feels significant fatigue in the evenings, but does not feel that he is truly sleepy.  He denies any sleepiness with driving.  His Epworth score today is 3, and his weight is neutral over the last 2 years.   Sleep Questionnaire What time do you typically go to bed?( Between what hours) 9-11 pm varied depending on job.  9-11 pm varied depending on job.  at 1609 on 05/07/13 by Alecia Lemming, CMA How long does it take you to fall asleep? 30 min to hour.  30 min to hour.  at 1609 on 05/07/13 by Alecia Lemming, CMA How many times during the night do you wake up? No Value 2-3 at 1609 on 05/07/13 by Alecia Lemming, CMA What time do you get out of bed to start your day? No Value varies sometimes 430 am -6am  at 1609 on 05/07/13 by Alecia Lemming, CMA Do you drive or operate heavy machinery in your occupation? No No at 1609 on 05/07/13 by Alecia Lemming, CMA How much has your weight changed (up or down) over the past two years? (In pounds) 0 oz (0 kg) 0 oz (0 kg) at 1609 on 05/07/13 by Alecia Lemming, CMA Have you ever had a sleep study before? No No at 1609 on 05/07/13 by Alecia Lemming, CMA Do you currently use CPAP? No No at 1609 on 05/07/13 by Alecia Lemming, CMA Do you wear oxygen at any time? No    Review of Systems   Constitutional: Negative for fever and unexpected weight change.  HENT: Negative for ear pain, nosebleeds, congestion, sore throat, rhinorrhea, sneezing, trouble swallowing, dental problem, postnasal drip and sinus pressure.   Eyes: Negative for redness and itching.  Respiratory: Negative for cough, chest tightness, shortness of breath and wheezing.   Cardiovascular: Positive for leg swelling. Negative for palpitations.  Gastrointestinal: Negative for nausea and vomiting.  Genitourinary: Negative for dysuria.  Musculoskeletal: Positive for back pain and joint swelling.  Skin: Negative for rash.  Neurological: Positive for headaches.  Hematological: Does not bruise/bleed easily.  Psychiatric/Behavioral: Negative for dysphoric mood. The patient is nervous/anxious.        Objective:   Physical Exam Constitutional:  Well developed, no acute distress  HENT:  Nares patent without discharge, mild turbinate hypertrophy  Oropharynx without exudate, palate and uvula are moderately elongated.   Eyes:  Perrla, eomi, no scleral icterus  Neck:  No JVD, no TMG  Cardiovascular:  Normal rate, regular rhythm, no rubs or gallops.  No murmurs        Intact distal pulses  Pulmonary :  Normal breath sounds, no stridor or respiratory distress   No rales, rhonchi, or wheezing  Abdominal:  Soft, nondistended, bowel sounds present.  No tenderness noted.   Musculoskeletal:  No lower extremity edema noted.  Lymph Nodes:  No cervical lymphadenopathy noted  Skin:  No cyanosis noted  Neurologic:  Alert, appropriate, moves all 4 extremities without obvious deficit.         Assessment & Plan:

## 2013-05-14 ENCOUNTER — Other Ambulatory Visit: Payer: Self-pay | Admitting: Internal Medicine

## 2013-06-01 ENCOUNTER — Other Ambulatory Visit: Payer: Self-pay | Admitting: Internal Medicine

## 2013-06-10 ENCOUNTER — Telehealth: Payer: Self-pay | Admitting: Pulmonary Disease

## 2013-06-10 NOTE — Telephone Encounter (Signed)
Called sleep ctr and cancelled npsg Dustin Henry

## 2013-06-18 ENCOUNTER — Encounter (HOSPITAL_BASED_OUTPATIENT_CLINIC_OR_DEPARTMENT_OTHER): Payer: BC Managed Care – PPO

## 2013-07-01 ENCOUNTER — Other Ambulatory Visit: Payer: Self-pay | Admitting: Internal Medicine

## 2013-07-29 ENCOUNTER — Other Ambulatory Visit: Payer: Self-pay | Admitting: Internal Medicine

## 2013-07-29 NOTE — Telephone Encounter (Signed)
ok 

## 2013-08-26 ENCOUNTER — Other Ambulatory Visit: Payer: Self-pay | Admitting: Internal Medicine

## 2013-09-22 ENCOUNTER — Telehealth: Payer: Self-pay | Admitting: Internal Medicine

## 2013-09-22 NOTE — Telephone Encounter (Signed)
Pt request refill HYDROcodone-acetaminophen (NORCO/VICODIN) 5-325 MG per tablet °

## 2013-09-22 NOTE — Telephone Encounter (Signed)
Left message on voicemail to call office. Can not print Rx till Friday.

## 2013-09-22 NOTE — Telephone Encounter (Signed)
Pt returning call to [facility] regarding rx refill.  States he is at work and is unable to answer phone.  Pt requesting information to be left on voicmail regarding rx.

## 2013-09-23 NOTE — Telephone Encounter (Signed)
Spoke to pt told him Rx will be ready for pick up tomorrow. Pt verbalized understanding.

## 2013-09-24 MED ORDER — HYDROCODONE-ACETAMINOPHEN 5-325 MG PO TABS: ORAL_TABLET | ORAL | Status: DC

## 2013-09-24 NOTE — Telephone Encounter (Signed)
Rx printed and signed and put at front desk for pickup. 

## 2013-10-20 ENCOUNTER — Telehealth: Payer: Self-pay | Admitting: Internal Medicine

## 2013-10-20 NOTE — Telephone Encounter (Signed)
Pt needs new rx hydrocodone °

## 2013-10-21 MED ORDER — HYDROCODONE-ACETAMINOPHEN 5-325 MG PO TABS: ORAL_TABLET | ORAL | Status: DC

## 2013-10-21 NOTE — Telephone Encounter (Signed)
Okay to refill and also do a drug screen

## 2013-10-21 NOTE — Telephone Encounter (Signed)
Spoke to pt told him Rx ready for pickup, explained new procedure to him regarding controlled substance drugs. Pt verbalized understanding. Rx printed and signed, controlled drug contact printed for pt to sign when goes to lab for urine drug screen and put at front desk.

## 2013-10-21 NOTE — Telephone Encounter (Signed)
Pt calling for refill for Hydrocodone, last filled 10/17 and was given 60 tablets. Please advise if refill okay and if you want urine drug screen?

## 2013-10-22 ENCOUNTER — Telehealth: Payer: Self-pay | Admitting: Internal Medicine

## 2013-10-22 NOTE — Telephone Encounter (Signed)
Pt came to pick-up prescription and was advised to go to the lab.  Pt declined going to lab, gave back script and controlled substance contract.  He requested an appointment with Dr. Kirtland Bouchard instead, he is scheduled for 10/26/13 at 3:45.

## 2013-10-25 ENCOUNTER — Encounter: Payer: Self-pay | Admitting: Internal Medicine

## 2013-10-26 ENCOUNTER — Encounter: Payer: Self-pay | Admitting: Internal Medicine

## 2013-10-26 ENCOUNTER — Ambulatory Visit (INDEPENDENT_AMBULATORY_CARE_PROVIDER_SITE_OTHER): Payer: BC Managed Care – PPO | Admitting: Internal Medicine

## 2013-10-26 VITALS — BP 150/90 | HR 96 | Temp 98.0°F | Resp 20 | Wt 182.0 lb

## 2013-10-26 DIAGNOSIS — F411 Generalized anxiety disorder: Secondary | ICD-10-CM

## 2013-10-26 DIAGNOSIS — I10 Essential (primary) hypertension: Secondary | ICD-10-CM

## 2013-10-26 DIAGNOSIS — M549 Dorsalgia, unspecified: Secondary | ICD-10-CM

## 2013-10-26 MED ORDER — PROMETHAZINE HCL 25 MG PO TABS: 25.0000 mg | ORAL_TABLET | Freq: Three times a day (TID) | ORAL | Status: DC | PRN

## 2013-10-26 NOTE — Patient Instructions (Signed)
Limit your sodium (Salt) intake  Behavioral health referral as discussed  Please check your blood pressure on a regular basis.  If it is consistently greater than 150/90, please make an office appointment.

## 2013-10-26 NOTE — Progress Notes (Signed)
Subjective:    Patient ID: Dustin Henry, male    DOB: 11-05-1971, 42 y.o.   MRN: [id number]  HPI   42 year old patient who is seen today for followup. He has a history of anxiety which has intensified. He has considerable job-related stress as well as stress from interpersonal relationships with family including wife and father. He works with her brother-in-law and they have a very tense relationship. He also has a Museum/gallery conservator. Most of the stress is work related.  He has some associated headaches that are much improved on weekends when he does not work. He has chronic low back pain. He does use hydrocodone when necessary. He has treated hypertension. Medical regimen also includes proximal obtain and diazepam. He is willing to have a  BH  referral  Past Medical History  Diagnosis Date  . ALLERGIC RHINITIS 08/18/2007  . ANXIETY 02/09/2010  . BACK PAIN 03/09/2010  . HYPERLIPIDEMIA 02/12/2010  . HYPERTENSION 02/09/2010  . MIGRAINE HEADACHE 08/18/2007  . IBS (irritable bowel syndrome)     History   Social History  . Marital Status: Married    Spouse Name: N/A    Number of Children: 1  . Years of Education: N/A   Occupational History  . pressman helper     Presenter, broadcasting   Social History Main Topics  . Smoking status: Former Smoker -- 3.00 packs/day for 10 years    Types: Cigarettes    Quit date: 12/09/2004  . Smokeless tobacco: Never Used  . Alcohol Use: Yes  . Drug Use: No  . Sexual Activity: Not on file   Other Topics Concern  . Not on file   Social History Narrative  . No narrative on file    Past Surgical History  Procedure Laterality Date  . Kidney stones    . Finger surgery      History reviewed. No pertinent family history.  Allergies  Allergen Reactions  . Doxycycline     REACTION: nausea  . Penicillins     REACTION: unspecified    Current Outpatient Prescriptions on File Prior to Visit  Medication Sig Dispense Refill  . cyclobenzaprine (FLEXERIL) 10 MG  tablet TAKE 1 TABLET BY MOUTH 3 TIMES DAILY AS NEEDED  60 tablet  5  . diazepam (VALIUM) 5 MG tablet TAKE 1 TABLET BY MOUTH EVERY 12 HOURS AS NEEDED FOR ANXIETY  60 tablet  1  . HYDROcodone-acetaminophen (NORCO/VICODIN) 5-325 MG per tablet TAKE 1 TABLET EVERY 6 HOURS AS NEEDED FOR PAIN  60 tablet  0  . ibuprofen (ADVIL,MOTRIN) 200 MG tablet Take 800 mg by mouth every 6 (six) hours as needed for pain.      Marland Kitchen lisinopril-hydrochlorothiazide (PRINZIDE,ZESTORETIC) 20-25 MG per tablet Take 1 tablet by mouth daily.  90 tablet  4  . PARoxetine (PAXIL) 20 MG tablet Take 1 tablet (20 mg total) by mouth every morning.  90 tablet  4   No current facility-administered medications on file prior to visit.    BP 150/90  Pulse 96  Temp(Src) 98 F (36.7 C) (Oral)  Resp 20  Wt 182 lb (82.555 kg)  SpO2 97%     Review of Systems  Constitutional: Negative for fever, chills, appetite change and fatigue.  HENT: Negative for congestion, dental problem, ear pain, hearing loss, sore throat, tinnitus, trouble swallowing and voice change.   Eyes: Negative for pain, discharge and visual disturbance.  Respiratory: Negative for cough, chest tightness, wheezing and stridor.   Cardiovascular: Negative for chest  pain, palpitations and leg swelling.  Gastrointestinal: Negative for nausea, vomiting, abdominal pain, diarrhea, constipation, blood in stool and abdominal distention.  Genitourinary: Negative for urgency, hematuria, flank pain, discharge, difficulty urinating and genital sores.  Musculoskeletal: Positive for back pain. Negative for arthralgias, gait problem, joint swelling, myalgias and neck stiffness.  Skin: Negative for rash.  Neurological: Negative for dizziness, syncope, speech difficulty, weakness, numbness and headaches.  Hematological: Negative for adenopathy. Does not bruise/bleed easily.  Psychiatric/Behavioral: Positive for sleep disturbance and decreased concentration. Negative for behavioral  problems and dysphoric mood. The patient is nervous/anxious.        Objective:   Physical Exam  Constitutional: He is oriented to person, place, and time. He appears well-developed.  HENT:  Head: Normocephalic.  Right Ear: External ear normal.  Left Ear: External ear normal.  Eyes: Conjunctivae and EOM are normal.  Neck: Normal range of motion.  Cardiovascular: Normal rate and normal heart sounds.   Pulmonary/Chest: Breath sounds normal.  Abdominal: Bowel sounds are normal.  Musculoskeletal: Normal range of motion. He exhibits no edema and no tenderness.  Neurological: He is alert and oriented to person, place, and time.  Psychiatric: He has a normal mood and affect. His behavior is normal.          Assessment & Plan:  Hypertension controlled Chronic low back pain. Hydrocodone refilled Chronic anxiety disorder. We'll set up for behavioral health referral  Controlled substance contract signed  Return in 3-4 months for followup

## 2013-10-27 ENCOUNTER — Telehealth: Payer: Self-pay | Admitting: Internal Medicine

## 2013-10-27 NOTE — Telephone Encounter (Signed)
Called pt back and spoke with him wanted to know if lab tests done yesterday were confidently? Told pt yes. Pt verbalized understanding.

## 2013-10-27 NOTE — Telephone Encounter (Signed)
Pt was seen yesterday and requesting donna to return his call

## 2013-11-15 ENCOUNTER — Encounter: Payer: Self-pay | Admitting: Internal Medicine

## 2013-11-22 ENCOUNTER — Telehealth: Payer: Self-pay | Admitting: Internal Medicine

## 2013-11-22 ENCOUNTER — Other Ambulatory Visit: Payer: Self-pay | Admitting: Internal Medicine

## 2013-11-22 MED ORDER — DIAZEPAM 5 MG PO TABS: ORAL_TABLET | ORAL | Status: DC

## 2013-11-22 MED ORDER — HYDROCODONE-ACETAMINOPHEN 5-325 MG PO TABS: ORAL_TABLET | ORAL | Status: DC

## 2013-11-22 NOTE — Telephone Encounter (Signed)
Spoke to pt told him Rx for Hydrocodone is ready for pickup, will be at the front desk. Also Rx for Valium was called into pharmacy. Pt verbalized understanding. Rx printed and signed, put at front desk.

## 2013-11-22 NOTE — Telephone Encounter (Signed)
Pt request refill of: diazepam (VALIUM) 5 MG tablet HYDROcodone-acetaminophen (NORCO/VICODIN) 5-325 MG per tablet  Pharm: [facility] [address] [address] Pt aware dr out this week

## 2013-12-19 ENCOUNTER — Other Ambulatory Visit: Payer: Self-pay | Admitting: Internal Medicine

## 2013-12-20 ENCOUNTER — Telehealth: Payer: Self-pay | Admitting: Internal Medicine

## 2013-12-20 MED ORDER — HYDROCODONE-ACETAMINOPHEN 5-325 MG PO TABS: ORAL_TABLET | ORAL | Status: DC

## 2013-12-20 NOTE — Telephone Encounter (Signed)
Spoke to pt Rx ready for pickup will be at front desk. Pt verbalized understanding. Rx printed and signed, put at front desk.

## 2013-12-20 NOTE — Telephone Encounter (Signed)
Pt needs new rx hydrocodone °

## 2014-01-18 ENCOUNTER — Ambulatory Visit: Payer: BC Managed Care – PPO | Admitting: Internal Medicine

## 2014-01-21 ENCOUNTER — Encounter: Payer: Self-pay | Admitting: Internal Medicine

## 2014-01-21 ENCOUNTER — Ambulatory Visit (INDEPENDENT_AMBULATORY_CARE_PROVIDER_SITE_OTHER): Payer: BC Managed Care – PPO | Admitting: Internal Medicine

## 2014-01-21 VITALS — BP 140/90 | HR 85 | Temp 98.0°F | Resp 20 | Ht 70.0 in | Wt 186.0 lb

## 2014-01-21 DIAGNOSIS — F411 Generalized anxiety disorder: Secondary | ICD-10-CM

## 2014-01-21 DIAGNOSIS — I10 Essential (primary) hypertension: Secondary | ICD-10-CM

## 2014-01-21 DIAGNOSIS — E785 Hyperlipidemia, unspecified: Secondary | ICD-10-CM

## 2014-01-21 DIAGNOSIS — M549 Dorsalgia, unspecified: Secondary | ICD-10-CM

## 2014-01-21 MED ORDER — HYDROCODONE-ACETAMINOPHEN 5-325 MG PO TABS: ORAL_TABLET | ORAL | Status: DC

## 2014-01-21 MED ORDER — PROMETHAZINE HCL 25 MG PO TABS: 25.0000 mg | ORAL_TABLET | Freq: Three times a day (TID) | ORAL | Status: DC | PRN

## 2014-01-21 NOTE — Progress Notes (Signed)
Pre-visit discussion using our clinic review tool. No additional management support is needed unless otherwise documented below in the visit note.  

## 2014-01-21 NOTE — Progress Notes (Signed)
Subjective:    Patient ID: Dustin Henry, male    DOB: 1971-04-21, 43 y.o.   MRN: [id number]  HPI  43 year old patient who has a history of anxiety disorder, hypertension, as well as intermittent low back pain.  He continues to have a poor job environment with 12 hour days and very long work weeks.  His job and finding requires considerable bending, stooping, which does aggravate his low back pain.  He feels overextended and stressed due to long hours.    Past Medical History  Diagnosis Date  . ALLERGIC RHINITIS 08/18/2007  . ANXIETY 02/09/2010  . BACK PAIN 03/09/2010  . HYPERLIPIDEMIA 02/12/2010  . HYPERTENSION 02/09/2010  . MIGRAINE HEADACHE 08/18/2007  . IBS (irritable bowel syndrome)     History   Social History  . Marital Status: Married    Spouse Name: N/A    Number of Children: 1  . Years of Education: N/A   Occupational History  . pressman helper     Engineer, structural   Social History Main Topics  . Smoking status: Former Smoker -- 3.00 packs/day for 10 years    Types: Cigarettes    Quit date: 12/09/2004  . Smokeless tobacco: Never Used  . Alcohol Use: Yes  . Drug Use: No  . Sexual Activity: Not on file   Other Topics Concern  . Not on file   Social History Narrative  . No narrative on file    Past Surgical History  Procedure Laterality Date  . Kidney stones    . Finger surgery      History reviewed. No pertinent family history.  Allergies  Allergen Reactions  . Doxycycline     REACTION: nausea  . Penicillins     REACTION: unspecified    Current Outpatient Prescriptions on File Prior to Visit  Medication Sig Dispense Refill  . cyclobenzaprine (FLEXERIL) 10 MG tablet TAKE 1 TABLET BY MOUTH 3 TIMES DAILY AS NEEDED  60 tablet  5  . diazepam (VALIUM) 5 MG tablet TAKE 1 TABLET BY MOUTH EVERY 12 HOURS AS NEEDED FOR ANXIETY  60 tablet  2  . ibuprofen (ADVIL,MOTRIN) 200 MG tablet Take 800 mg by mouth every 6 (six) hours as needed for pain.      Marland Kitchen  lisinopril-hydrochlorothiazide (PRINZIDE,ZESTORETIC) 20-25 MG per tablet TAKE 1 TABLET BY MOUTH ONCE DAILY  90 tablet  1  . meloxicam (MOBIC) 15 MG tablet Take 15 mg by mouth daily.       Marland Kitchen PARoxetine (PAXIL) 20 MG tablet TAKE 1 TABLET BY MOUTH ONCE EVERY MORNING *PT NEEDS OFFICE VISIT*  90 tablet  3   No current facility-administered medications on file prior to visit.    BP 140/90  Pulse 85  Temp(Src) 98 F (36.7 C) (Oral)  Resp 20  Ht 5\' 10"  (1.778 m)  Wt 186 lb (84.369 kg)  BMI 26.69 kg/m2  SpO2 98%       Review of Systems  Constitutional: Negative for fever, chills, appetite change and fatigue.  HENT: Negative for congestion, dental problem, ear pain, hearing loss, sore throat, tinnitus, trouble swallowing and voice change.   Eyes: Negative for pain, discharge and visual disturbance.  Respiratory: Negative for cough, chest tightness, wheezing and stridor.   Cardiovascular: Negative for chest pain, palpitations and leg swelling.  Gastrointestinal: Negative for nausea, vomiting, abdominal pain, diarrhea, constipation, blood in stool and abdominal distention.  Genitourinary: Negative for urgency, hematuria, flank pain, discharge, difficulty urinating and genital sores.  Musculoskeletal:  Positive for back pain. Negative for arthralgias, gait problem, joint swelling, myalgias and neck stiffness.  Skin: Negative for rash.  Neurological: Positive for headaches. Negative for dizziness, syncope, speech difficulty, weakness and numbness.  Hematological: Negative for adenopathy. Does not bruise/bleed easily.  Psychiatric/Behavioral: Positive for sleep disturbance. Negative for behavioral problems and dysphoric mood. The patient is nervous/anxious.        Objective:   Physical Exam  Constitutional: He is oriented to person, place, and time. He appears well-developed and well-nourished. No distress.  Blood pressure 130/90  HENT:  Head: Normocephalic.  Right Ear: External ear  normal.  Left Ear: External ear normal.  Eyes: Conjunctivae and EOM are normal.  Neck: Normal range of motion.  Cardiovascular: Normal rate and normal heart sounds.   Pulmonary/Chest: Breath sounds normal.  Abdominal: Bowel sounds are normal.  Musculoskeletal: Normal range of motion. He exhibits no edema and no tenderness.  Neurological: He is alert and oriented to person, place, and time.  Psychiatric: His behavior is normal. Judgment and thought content normal.  Appears tired and stressed          Assessment & Plan:   Hypertension Situational stress Low back pain  Behavioral  health referral  Medications updated Patient will consider terminating  His job to allow full time Efforts at a new job position.

## 2014-01-21 NOTE — Patient Instructions (Signed)
Behavioral health referral as discussed  Limit your sodium (Salt) intake    It is important that you exercise regularly, at least 20 minutes 3 to 4 times per week.  If you develop chest pain or shortness of breath seek  medical attention.  Return in 3 months for follow-up

## 2014-01-24 ENCOUNTER — Telehealth: Payer: Self-pay | Admitting: Internal Medicine

## 2014-01-24 NOTE — Telephone Encounter (Signed)
Relevant patient education mailed to patient.  

## 2014-02-17 ENCOUNTER — Telehealth: Payer: Self-pay | Admitting: Internal Medicine

## 2014-02-17 ENCOUNTER — Other Ambulatory Visit: Payer: Self-pay | Admitting: Internal Medicine

## 2014-02-17 MED ORDER — HYDROCODONE-ACETAMINOPHEN 5-325 MG PO TABS: ORAL_TABLET | ORAL | Status: DC

## 2014-02-17 NOTE — Telephone Encounter (Signed)
Pt notified Rx ready for pickup, will be at the front desk. Rx printed and signed. 

## 2014-02-17 NOTE — Telephone Encounter (Signed)
Pt request refill HYDROcodone-acetaminophen (NORCO/VICODIN) 5-325 MG per tablet Ok to leave [employer]

## 2014-02-28 ENCOUNTER — Other Ambulatory Visit: Payer: Self-pay | Admitting: Internal Medicine

## 2014-03-24 ENCOUNTER — Telehealth: Payer: Self-pay | Admitting: Internal Medicine

## 2014-03-24 MED ORDER — HYDROCODONE-ACETAMINOPHEN 5-325 MG PO TABS: ORAL_TABLET | ORAL | Status: DC

## 2014-03-24 NOTE — Telephone Encounter (Signed)
Pt request refill HYDROcodone-acetaminophen (NORCO/VICODIN) 5-325 MG per tablet  ok to leave message

## 2014-03-24 NOTE — Telephone Encounter (Signed)
Left detailed message Rx ready for pickup. Rx printed and signed. 

## 2014-04-01 ENCOUNTER — Ambulatory Visit (INDEPENDENT_AMBULATORY_CARE_PROVIDER_SITE_OTHER): Payer: BC Managed Care – PPO | Admitting: Internal Medicine

## 2014-04-01 ENCOUNTER — Encounter: Payer: Self-pay | Admitting: Internal Medicine

## 2014-04-01 VITALS — BP 140/86 | HR 103 | Temp 98.1°F | Resp 20 | Ht 70.0 in | Wt 186.0 lb

## 2014-04-01 DIAGNOSIS — I10 Essential (primary) hypertension: Secondary | ICD-10-CM

## 2014-04-01 DIAGNOSIS — F411 Generalized anxiety disorder: Secondary | ICD-10-CM

## 2014-04-01 NOTE — Progress Notes (Signed)
Pre-visit discussion using our clinic review tool. No additional management support is needed unless otherwise documented below in the visit note.  

## 2014-04-01 NOTE — Progress Notes (Signed)
Subjective:    Patient ID: Dustin Henry, male    DOB: 04-10-1971, 43 y.o.   MRN: [id number]  HPI   43 year old patient who has treated hypertension, anxiety disorder, and history of headaches.  He continues to have considerable situational stress, but does have a new job prospect and is meeting with a possible new employer.  Later this evening.  Past Medical History  Diagnosis Date  . ALLERGIC RHINITIS 08/18/2007  . ANXIETY 02/09/2010  . BACK PAIN 03/09/2010  . HYPERLIPIDEMIA 02/12/2010  . HYPERTENSION 02/09/2010  . MIGRAINE HEADACHE 08/18/2007  . IBS (irritable bowel syndrome)     History   Social History  . Marital Status: Married    Spouse Name: N/A    Number of Children: 1  . Years of Education: N/A   Occupational History  . pressman helper     Engineer, structural   Social History Main Topics  . Smoking status: Former Smoker -- 3.00 packs/day for 10 years    Types: Cigarettes    Quit date: 12/09/2004  . Smokeless tobacco: Never Used  . Alcohol Use: Yes  . Drug Use: No  . Sexual Activity: Not on file   Other Topics Concern  . Not on file   Social History Narrative  . No narrative on file    Past Surgical History  Procedure Laterality Date  . Kidney stones    . Finger surgery      History reviewed. No pertinent family history.  Allergies  Allergen Reactions  . Doxycycline     REACTION: nausea  . Penicillins     REACTION: unspecified    Current Outpatient Prescriptions on File Prior to Visit  Medication Sig Dispense Refill  . cyclobenzaprine (FLEXERIL) 10 MG tablet TAKE 1 TABLET BY MOUTH 3 TIMES DAILY AS NEEDED  60 tablet  2  . diazepam (VALIUM) 5 MG tablet TAKE 1 TABLET BY MOUTH EVERY 12 HOURS AS NEEDED FOR ANXIETY  60 tablet  2  . HYDROcodone-acetaminophen (NORCO/VICODIN) 5-325 MG per tablet TAKE 1 TABLET EVERY 6 HOURS AS NEEDED FOR PAIN  60 tablet  0  . ibuprofen (ADVIL,MOTRIN) 200 MG tablet Take 800 mg by mouth every 6 (six) hours as needed for pain.        Marland Kitchen lisinopril-hydrochlorothiazide (PRINZIDE,ZESTORETIC) 20-25 MG per tablet TAKE 1 TABLET BY MOUTH ONCE DAILY  90 tablet  1  . meloxicam (MOBIC) 15 MG tablet TAKE 1 TABLET BY MOUTH ONCE DAILY  90 tablet  3  . PARoxetine (PAXIL) 20 MG tablet TAKE 1 TABLET BY MOUTH ONCE EVERY MORNING *PT NEEDS OFFICE VISIT*  90 tablet  3  . promethazine (PHENERGAN) 25 MG tablet Take 1 tablet (25 mg total) by mouth every 8 (eight) hours as needed.  30 tablet  2   No current facility-administered medications on file prior to visit.    BP 140/86  Pulse 103  Temp(Src) 98.1 F (36.7 C) (Oral)  Resp 20  Ht 5\' 10"  (1.778 m)  Wt 186 lb (84.369 kg)  BMI 26.69 kg/m2  SpO2 98%       Review of Systems  Constitutional: Negative for fever, chills, appetite change and fatigue.  HENT: Negative for congestion, dental problem, ear pain, hearing loss, sore throat, tinnitus, trouble swallowing and voice change.   Eyes: Negative for pain, discharge and visual disturbance.  Respiratory: Negative for cough, chest tightness, wheezing and stridor.   Cardiovascular: Negative for chest pain, palpitations and leg swelling.  Gastrointestinal: Negative for  nausea, vomiting, abdominal pain, diarrhea, constipation, blood in stool and abdominal distention.  Genitourinary: Negative for urgency, hematuria, flank pain, discharge, difficulty urinating and genital sores.  Musculoskeletal: Negative for arthralgias, back pain, gait problem, joint swelling, myalgias and neck stiffness.  Skin: Negative for rash.  Neurological: Negative for dizziness, syncope, speech difficulty, weakness, numbness and headaches.  Hematological: Negative for adenopathy. Does not bruise/bleed easily.  Psychiatric/Behavioral: Positive for decreased concentration. Negative for behavioral problems and dysphoric mood. The patient is nervous/anxious.        Objective:   Physical Exam  Constitutional: He is oriented to person, place, and time. He appears  well-developed.  Repeat blood pressure 120/78  HENT:  Head: Normocephalic.  Right Ear: External ear normal.  Left Ear: External ear normal.  Eyes: Conjunctivae and EOM are normal.  Neck: Normal range of motion.  Cardiovascular: Normal rate and normal heart sounds.   Pulmonary/Chest: Breath sounds normal.  Abdominal: Bowel sounds are normal.  Musculoskeletal: Normal range of motion. He exhibits no edema and no tenderness.  Neurological: He is alert and oriented to person, place, and time.  Psychiatric: He has a normal mood and affect. His behavior is normal.          Assessment & Plan:    Hypertension well controlled Situational stress.  Hopefully, patient will find a less stressful job with benefits  Recheck 6 months or as needed

## 2014-04-01 NOTE — Patient Instructions (Signed)
Limit your sodium (Salt) intake  Please check your blood pressure on a regular basis.  If it is consistently greater than 150/90, please make an office appointment.  Return in 6 months for follow-up   

## 2014-04-21 ENCOUNTER — Telehealth: Payer: Self-pay | Admitting: Internal Medicine

## 2014-04-21 MED ORDER — HYDROCODONE-ACETAMINOPHEN 5-325 MG PO TABS: ORAL_TABLET | ORAL | Status: DC

## 2014-04-21 NOTE — Telephone Encounter (Signed)
Pt req rx HYDROcodone-acetaminophen (NORCO/VICODIN) 5-325 MG per tablet ° °

## 2014-04-21 NOTE — Telephone Encounter (Signed)
Left detailed message Rx ready for pickup. Rx printed and signed. 

## 2014-05-16 ENCOUNTER — Telehealth: Payer: Self-pay | Admitting: Internal Medicine

## 2014-05-16 MED ORDER — DIAZEPAM 5 MG PO TABS: ORAL_TABLET | ORAL | Status: DC

## 2014-05-16 MED ORDER — HYDROCODONE-ACETAMINOPHEN 5-325 MG PO TABS: ORAL_TABLET | ORAL | Status: DC

## 2014-05-16 NOTE — Telephone Encounter (Signed)
Left detailed message Rx's are ready for pickup. Rx's printed and signed. 

## 2014-05-16 NOTE — Telephone Encounter (Signed)
Pt called to req rx on HYDROcodone-acetaminophen (NORCO/VICODIN) 5-325 MG per tablet  And also DIAZEPAM 5MG 

## 2014-05-30 ENCOUNTER — Other Ambulatory Visit: Payer: Self-pay | Admitting: Internal Medicine

## 2014-05-31 ENCOUNTER — Telehealth: Payer: Self-pay | Admitting: Internal Medicine

## 2014-05-31 MED ORDER — SUMATRIPTAN SUCCINATE 100 MG PO TABS: 100.0000 mg | ORAL_TABLET | ORAL | Status: DC | PRN

## 2014-05-31 NOTE — Telephone Encounter (Signed)
Please advise 

## 2014-05-31 NOTE — Telephone Encounter (Signed)
Pt wants you to know that he has a migraine that started yesterday, pt has appt fri, and can only come in late afternoon due to work. Pt  has vicodin to take, this is the second migraine in 11 yrs. The first one was 3 wks ago.  Pt wants to know if you want to still see him or perhaps call in imitrix which has helped in the past.  Pt states he knows this is brought on by stress, and the stress at work. pls advise [facility]

## 2014-05-31 NOTE — Telephone Encounter (Signed)
Okay to call in generic Imitrex 100 mg #3

## 2014-05-31 NOTE — Telephone Encounter (Signed)
Spoke to pt told him Rx for Imitrex called into pharmacy for him. Pt verbalized understanding.

## 2014-06-03 ENCOUNTER — Encounter: Payer: Self-pay | Admitting: Internal Medicine

## 2014-06-03 ENCOUNTER — Ambulatory Visit (INDEPENDENT_AMBULATORY_CARE_PROVIDER_SITE_OTHER): Payer: BC Managed Care – PPO | Admitting: Internal Medicine

## 2014-06-03 VITALS — BP 130/80 | HR 88 | Temp 98.3°F | Wt 188.0 lb

## 2014-06-03 DIAGNOSIS — I10 Essential (primary) hypertension: Secondary | ICD-10-CM

## 2014-06-03 DIAGNOSIS — G43909 Migraine, unspecified, not intractable, without status migrainosus: Secondary | ICD-10-CM

## 2014-06-03 MED ORDER — TOPIRAMATE 25 MG PO TABS: ORAL_TABLET | ORAL | Status: DC

## 2014-06-03 MED ORDER — PROMETHAZINE HCL 50 MG/ML IJ SOLN
50.0000 mg | Freq: Four times a day (QID) | INTRAMUSCULAR | Status: DC | PRN
  Administered 2014-06-03: 50 mg via INTRAMUSCULAR

## 2014-06-03 MED ORDER — MEPERIDINE HCL 50 MG/ML IJ SOLN
50.0000 mg | Freq: Once | INTRAMUSCULAR | Status: AC
Start: 2014-06-03 — End: 2014-06-03
  Administered 2014-06-03: 50 mg via INTRAMUSCULAR

## 2014-06-03 MED ORDER — HYDROCODONE-ACETAMINOPHEN 5-325 MG PO TABS: ORAL_TABLET | ORAL | Status: DC

## 2014-06-03 MED ORDER — TOPIRAMATE 50 MG PO TABS: 50.0000 mg | ORAL_TABLET | Freq: Two times a day (BID) | ORAL | Status: DC

## 2014-06-03 NOTE — Patient Instructions (Addendum)
Topamax one at bedtime for 7 days, then one twice daily; week 3:   1 in the morning and 2 at bedtime; starting week 4:  50 mg twice dailyMigraine Headache A migraine headache is an intense, throbbing pain on one or both sides of your head. A migraine can last for 30 minutes to several hours. CAUSES  The exact cause of a migraine headache is not always known. However, a migraine may be caused when nerves in the brain become irritated and release chemicals that cause inflammation. This causes pain. Certain things may also trigger migraines, such as:  Alcohol.  Smoking.  Stress.  Menstruation.  Aged cheeses.  Foods or drinks that contain nitrates, glutamate, aspartame, or tyramine.  Lack of sleep.  Chocolate.  Caffeine.  Hunger.  Physical exertion.  Fatigue.  Medicines used to treat chest pain (nitroglycerine), birth control pills, estrogen, and some blood pressure medicines. SIGNS AND SYMPTOMS  Pain on one or both sides of your head.  Pulsating or throbbing pain.  Severe pain that prevents daily activities.  Pain that is aggravated by any physical activity.  Nausea, vomiting, or both.  Dizziness.  Pain with exposure to bright lights, loud noises, or activity.  General sensitivity to bright lights, loud noises, or smells. Before you get a migraine, you may get warning signs that a migraine is coming (aura). An aura may include:  Seeing flashing lights.  Seeing bright spots, halos, or zig-zag lines.  Having tunnel vision or blurred vision.  Having feelings of numbness or tingling.  Having trouble talking.  Having muscle weakness. DIAGNOSIS  A migraine headache is often diagnosed based on:  Symptoms.  Physical exam.  A CT scan or MRI of your head. These imaging tests cannot diagnose migraines, but they can help rule out other causes of headaches. TREATMENT Medicines may be given for pain and nausea. Medicines can also be given to help prevent recurrent  migraines.  HOME CARE INSTRUCTIONS  Only take over-the-counter or prescription medicines for pain or discomfort as directed by your health care provider. The use of long-term narcotics is not recommended.  Lie down in a dark, quiet room when you have a migraine.  Keep a journal to find out what may trigger your migraine headaches. For example, write down:  What you eat and drink.  How much sleep you get.  Any change to your diet or medicines.  Limit alcohol consumption.  Quit smoking if you smoke.  Get 7-9 hours of sleep, or as recommended by your health care provider.  Limit stress.  Keep lights dim if bright lights bother you and make your migraines worse. SEEK IMMEDIATE MEDICAL CARE IF:   Your migraine becomes severe.  You have a fever.  You have a stiff neck.  You have vision loss.  You have muscular weakness or loss of muscle control.  You start losing your balance or have trouble walking.  You feel faint or pass out.  You have severe symptoms that are different from your first symptoms. MAKE SURE YOU:   Understand these instructions.  Will watch your condition.  Will get help right away if you are not doing well or get worse. Document Released: 11/25/2005 Document Revised: 09/15/2013 Document Reviewed: 08/02/2013 [facility] Patient Information 2015 [place], [place]. This information is not intended to replace advice given to you by your health care provider. Make sure you discuss any questions you have with your health care provider.

## 2014-06-03 NOTE — Progress Notes (Signed)
Pre visit review using our clinic review tool, if applicable. No additional management support is needed unless otherwise documented below in the visit note. 

## 2014-06-03 NOTE — Progress Notes (Signed)
Subjective:    Patient ID: Dustin Henry, male    DOB: 1971-05-02, 43 y.o.   MRN: [id number]  HPI 43 year old patient who has treated hypertension and also history of migraine headaches.  He has had a significant migraine throughout the week that has been only alleviated by Imitrex and analgesics.  He has had considerable situational work related stress that has been an important trigger.  Past Medical History  Diagnosis Date  . ALLERGIC RHINITIS 08/18/2007  . ANXIETY 02/09/2010  . BACK PAIN 03/09/2010  . HYPERLIPIDEMIA 02/12/2010  . HYPERTENSION 02/09/2010  . MIGRAINE HEADACHE 08/18/2007  . IBS (irritable bowel syndrome)     History   Social History  . Marital Status: Married    Spouse Name: N/A    Number of Children: 1  . Years of Education: N/A   Occupational History  . pressman helper     Engineer, structural   Social History Main Topics  . Smoking status: Former Smoker -- 3.00 packs/day for 10 years    Types: Cigarettes    Quit date: 12/09/2004  . Smokeless tobacco: Never Used  . Alcohol Use: Yes  . Drug Use: No  . Sexual Activity: Not on file   Other Topics Concern  . Not on file   Social History Narrative  . No narrative on file    Past Surgical History  Procedure Laterality Date  . Kidney stones    . Finger surgery      History reviewed. No pertinent family history.  Allergies  Allergen Reactions  . Doxycycline     REACTION: nausea  . Penicillins     REACTION: unspecified    Current Outpatient Prescriptions on File Prior to Visit  Medication Sig Dispense Refill  . cyclobenzaprine (FLEXERIL) 10 MG tablet TAKE 1 TABLET BY MOUTH 3 TIMES DAILY AS NEEDED  60 tablet  2  . diazepam (VALIUM) 5 MG tablet TAKE 1 TABLET BY MOUTH EVERY 12 HOURS AS NEEDED FOR ANXIETY  60 tablet  2  . HYDROcodone-acetaminophen (NORCO/VICODIN) 5-325 MG per tablet TAKE 1 TABLET EVERY 6 HOURS AS NEEDED FOR PAIN  60 tablet  0  . ibuprofen (ADVIL,MOTRIN) 200 MG tablet Take 800 mg by mouth  every 6 (six) hours as needed for pain.      Marland Kitchen lisinopril-hydrochlorothiazide (PRINZIDE,ZESTORETIC) 20-25 MG per tablet TAKE 1 TABLET BY MOUTH ONCE DAILY  90 tablet  1  . meloxicam (MOBIC) 15 MG tablet TAKE 1 TABLET BY MOUTH ONCE DAILY  90 tablet  3  . PARoxetine (PAXIL) 20 MG tablet TAKE 1 TABLET BY MOUTH ONCE EVERY MORNING *PT NEEDS OFFICE VISIT*  90 tablet  3  . promethazine (PHENERGAN) 25 MG tablet TAKE 1 TABLET BY MOUTH EVERY 8 HOURS AS NEEDED  30 tablet  2  . SUMAtriptan (IMITREX) 100 MG tablet Take 1 tablet (100 mg total) by mouth every 2 (two) hours as needed for migraine or headache. May repeat in 2 hours if headache persists or recurs.  3 tablet  0   No current facility-administered medications on file prior to visit.    BP 130/80  Pulse 88  Temp(Src) 98.3 F (36.8 C) (Oral)  Wt 188 lb (85.276 kg)      Review of Systems  Constitutional: Negative for fever, chills, appetite change and fatigue.  HENT: Negative for congestion, dental problem, ear pain, hearing loss, sore throat, tinnitus, trouble swallowing and voice change.   Eyes: Negative for pain, discharge and visual disturbance.  Respiratory:  Negative for cough, chest tightness, wheezing and stridor.   Cardiovascular: Negative for chest pain, palpitations and leg swelling.  Gastrointestinal: Negative for nausea, vomiting, abdominal pain, diarrhea, constipation, blood in stool and abdominal distention.  Genitourinary: Negative for urgency, hematuria, flank pain, discharge, difficulty urinating and genital sores.  Musculoskeletal: Negative for arthralgias, back pain, gait problem, joint swelling, myalgias and neck stiffness.  Skin: Negative for rash.  Neurological: Positive for headaches. Negative for dizziness, syncope, speech difficulty, weakness and numbness.  Hematological: Negative for adenopathy. Does not bruise/bleed easily.  Psychiatric/Behavioral: Positive for dysphoric mood. Negative for behavioral problems. The  patient is nervous/anxious.        Objective:   Physical Exam  Constitutional: He is oriented to person, place, and time. He appears well-developed.  Stable vital signs Uncomfortable with some photosensitivity  HENT:  Head: Normocephalic.  Right Ear: External ear normal.  Left Ear: External ear normal.  Eyes: Conjunctivae and EOM are normal.  Neck: Normal range of motion.  Cardiovascular: Normal rate and normal heart sounds.   Pulmonary/Chest: Breath sounds normal.  Abdominal: Bowel sounds are normal.  Musculoskeletal: Normal range of motion. He exhibits no edema and no tenderness.  Neurological: He is alert and oriented to person, place, and time.  Psychiatric: He has a normal mood and affect. His behavior is normal.          Assessment & Plan:   Migraine syndrome.  We'll continue analgesics.  Medications refilled in view of the severity and chronicity.  Will place on Topamax prophylaxis Hypertension stable

## 2014-06-11 ENCOUNTER — Other Ambulatory Visit: Payer: Self-pay | Admitting: Internal Medicine

## 2014-06-28 ENCOUNTER — Other Ambulatory Visit: Payer: Self-pay | Admitting: Internal Medicine

## 2014-06-30 ENCOUNTER — Telehealth: Payer: Self-pay | Admitting: Internal Medicine

## 2014-06-30 MED ORDER — HYDROCODONE-ACETAMINOPHEN 5-325 MG PO TABS: ORAL_TABLET | ORAL | Status: DC

## 2014-06-30 NOTE — Telephone Encounter (Signed)
rx up front for p/u, pt aware 

## 2014-06-30 NOTE — Telephone Encounter (Signed)
Pt request refill of the following:   HYDROcodone-acetaminophen (NORCO/VICODIN) 5-325 MG per tablet ° ° ° °Phamacy:   Pick up  ° °

## 2014-06-30 NOTE — Telephone Encounter (Signed)
ok 

## 2014-07-28 ENCOUNTER — Telehealth: Payer: Self-pay | Admitting: Internal Medicine

## 2014-07-28 NOTE — Telephone Encounter (Signed)
Pt is needing new rxHYDROcodone-acetaminophen (NORCO/VICODIN) 5-325 MG per tablet please call when available for pick up.

## 2014-07-29 MED ORDER — HYDROCODONE-ACETAMINOPHEN 5-325 MG PO TABS: ORAL_TABLET | ORAL | Status: DC

## 2014-07-29 NOTE — Telephone Encounter (Signed)
Voicemail left, rx signed and placed up front

## 2014-08-31 ENCOUNTER — Telehealth: Payer: Self-pay | Admitting: Internal Medicine

## 2014-08-31 MED ORDER — HYDROCODONE-ACETAMINOPHEN 5-325 MG PO TABS: ORAL_TABLET | ORAL | Status: DC

## 2014-08-31 NOTE — Telephone Encounter (Signed)
Spoke to pt, told him Rx ready for pickup. Pt verbalized understanding. Rx printed and signed.

## 2014-08-31 NOTE — Telephone Encounter (Signed)
Pt request refill of the following: HYDROcodone-acetaminophen (NORCO/VICODIN) 5-325 MG per tablet    Phamacy:  Pick up  ,,  pt said can leave message

## 2014-09-18 ENCOUNTER — Other Ambulatory Visit: Payer: Self-pay | Admitting: Internal Medicine

## 2014-10-03 ENCOUNTER — Telehealth: Payer: Self-pay | Admitting: Internal Medicine

## 2014-10-03 NOTE — Telephone Encounter (Signed)
Pt needs new rx hydrocodone °

## 2014-10-04 MED ORDER — HYDROCODONE-ACETAMINOPHEN 5-325 MG PO TABS: ORAL_TABLET | ORAL | Status: DC

## 2014-10-04 NOTE — Telephone Encounter (Signed)
Left detailed message Rx ready for pickup. Rx printed and signed. 

## 2014-10-20 ENCOUNTER — Other Ambulatory Visit: Payer: Self-pay | Admitting: Internal Medicine

## 2014-11-01 ENCOUNTER — Other Ambulatory Visit: Payer: Self-pay | Admitting: Internal Medicine

## 2014-11-01 MED ORDER — HYDROCODONE-ACETAMINOPHEN 5-325 MG PO TABS: ORAL_TABLET | ORAL | Status: DC

## 2014-11-01 NOTE — Telephone Encounter (Signed)
Pt request refill HYDROcodone-acetaminophen (NORCO/VICODIN) 5-325 MG per tablet °

## 2014-11-01 NOTE — Telephone Encounter (Signed)
Left a message for pt making aware rx ready for pick up.

## 2014-11-08 ENCOUNTER — Other Ambulatory Visit: Payer: Self-pay | Admitting: *Deleted

## 2014-11-08 MED ORDER — DIAZEPAM 5 MG PO TABS: ORAL_TABLET | ORAL | Status: DC

## 2014-11-30 ENCOUNTER — Telehealth: Payer: Self-pay | Admitting: Internal Medicine

## 2014-11-30 MED ORDER — HYDROCODONE-ACETAMINOPHEN 5-325 MG PO TABS: ORAL_TABLET | ORAL | Status: DC

## 2014-11-30 NOTE — Telephone Encounter (Signed)
Left detailed message Rx ready for pickup. Rx printed and signed by Dr. Raliegh Ip. There are no recent labs on pt.

## 2014-11-30 NOTE — Telephone Encounter (Signed)
Pt request refill HYDROcodone-acetaminophen (NORCO/VICODIN) 5-325 MG per tablet Ok to leave messagePt would results of labs

## 2014-12-26 ENCOUNTER — Telehealth: Payer: Self-pay | Admitting: Internal Medicine

## 2014-12-26 NOTE — Telephone Encounter (Signed)
Patient need a re-fill HYDROcodone-acetaminophen (NORCO/VICODIN) 5-325 MG per tablet.  Ok to leave a message if needed.

## 2014-12-28 MED ORDER — HYDROCODONE-ACETAMINOPHEN 5-325 MG PO TABS: ORAL_TABLET | ORAL | Status: DC

## 2014-12-28 NOTE — Telephone Encounter (Signed)
Pt notified Rx ready for pickup. Rx printed and signed.  

## 2015-01-09 ENCOUNTER — Other Ambulatory Visit: Payer: Self-pay | Admitting: Internal Medicine

## 2015-01-24 ENCOUNTER — Encounter: Payer: Self-pay | Admitting: Internal Medicine

## 2015-01-24 ENCOUNTER — Ambulatory Visit (INDEPENDENT_AMBULATORY_CARE_PROVIDER_SITE_OTHER): Payer: 59 | Admitting: Internal Medicine

## 2015-01-24 VITALS — BP 150/90 | HR 100 | Temp 98.6°F | Resp 20 | Ht 70.0 in | Wt 188.0 lb

## 2015-01-24 DIAGNOSIS — M5441 Lumbago with sciatica, right side: Secondary | ICD-10-CM

## 2015-01-24 DIAGNOSIS — G43009 Migraine without aura, not intractable, without status migrainosus: Secondary | ICD-10-CM

## 2015-01-24 DIAGNOSIS — I10 Essential (primary) hypertension: Secondary | ICD-10-CM

## 2015-01-24 MED ORDER — SUMATRIPTAN SUCCINATE 100 MG PO TABS: 100.0000 mg | ORAL_TABLET | ORAL | Status: DC | PRN

## 2015-01-24 MED ORDER — HYDROCODONE-ACETAMINOPHEN 5-325 MG PO TABS: ORAL_TABLET | ORAL | Status: DC

## 2015-01-24 NOTE — Progress Notes (Signed)
Subjective:    Patient ID: Dustin Henry, male    DOB: [birth year], 44 y.o.   MRN: [id number]  HPI  44 year old patient who is seen today in follow-up.  He does have a history of chronic low back pain.  This has intensified recently with occasional shooting pain down the right lateral leg to the ankle area.  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 He does have a history of renal colic and treated hypertension.  Past Medical History  Diagnosis Date  . ALLERGIC RHINITIS 08/18/2007  . ANXIETY 02/09/2010  . BACK PAIN 03/09/2010  . HYPERLIPIDEMIA 02/12/2010  . HYPERTENSION 02/09/2010  . MIGRAINE HEADACHE 08/18/2007  . IBS (irritable bowel syndrome)     History   Social History  . Marital Status: Married    Spouse Name: N/A  . Number of Children: 1  . Years of Education: N/A   Occupational History  . pressman helper     Engineer, structural   Social History Main Topics  . Smoking status: Former Smoker -- 3.00 packs/day for 10 years    Types: Cigarettes    Quit date: 12/09/2004  . Smokeless tobacco: Never Used  . Alcohol Use: Yes  . Drug Use: No  . Sexual Activity: Not on file   Other Topics Concern  . Not on file   Social History Narrative    Past Surgical History  Procedure Laterality Date  . Kidney stones    . Finger surgery      No family history on file.  Allergies  Allergen Reactions  . Doxycycline     REACTION: nausea  . Penicillins     REACTION: unspecified    Current Outpatient Prescriptions on File Prior to Visit  Medication Sig Dispense Refill  . cyclobenzaprine (FLEXERIL) 10 MG tablet TAKE 1 TABLET BY MOUTH 3 TIMES DAILY AS NEEDED 60 tablet 2  . diazepam (VALIUM) 5 MG tablet TAKE 1 TABLET BY MOUTH EVERY 12 HOURS AS NEEDED FOR ANXIETY 60 tablet 2  . ibuprofen (ADVIL,MOTRIN) 200 MG tablet Take 800 mg by mouth every 6 (six) hours as needed for pain.    Marland Kitchen lisinopril-hydrochlorothiazide (PRINZIDE,ZESTORETIC) 20-25 MG  per tablet TAKE 1 TABLET BY MOUTH EVERY DAY 90 tablet 1  . meloxicam (MOBIC) 15 MG tablet TAKE 1 TABLET BY MOUTH ONCE DAILY 90 tablet 3  . PARoxetine (PAXIL) 20 MG tablet TAKE 1 TABLET BY MOUTH EVERY MORNING *PT NEEDS OFFICE VISIT* 90 tablet 1  . promethazine (PHENERGAN) 25 MG tablet TAKE 1 TABLET BY MOUTH EVERY 8 HOURS AS NEEDED 30 tablet 2   No current facility-administered medications on file prior to visit.    BP 150/90 mmHg  Pulse 100  Temp(Src) 98.6 F (37 C) (Oral)  Resp 20  Ht 5\' 10"  (1.778 m)  Wt 188 lb (85.276 kg)  BMI 26.98 kg/m2  SpO2 98%     Review of Systems  Constitutional: Negative for fever, chills, appetite change and fatigue.  HENT: Negative for congestion, dental problem, ear pain, hearing loss, sore throat, tinnitus, trouble swallowing and voice change.   Eyes: Negative for pain, discharge and visual disturbance.  Respiratory: Negative for cough, chest tightness, wheezing and stridor.   Cardiovascular: Negative for chest pain, palpitations and leg swelling.  Gastrointestinal: Negative for nausea, vomiting, abdominal pain, diarrhea, constipation, blood in stool and abdominal distention.  Genitourinary: Negative for urgency, hematuria, flank pain, discharge, difficulty urinating and genital sores.  Musculoskeletal: Positive  for back pain. Negative for myalgias, joint swelling, arthralgias, gait problem and neck stiffness.  Skin: Negative for rash.  Neurological: Negative for dizziness, syncope, speech difficulty, weakness, numbness and headaches.  Hematological: Negative for adenopathy. Does not bruise/bleed easily.  Psychiatric/Behavioral: Negative for behavioral problems and dysphoric mood. The patient is not nervous/anxious.        Objective:   Physical Exam  Constitutional: He appears well-developed and well-nourished. No distress.  Musculoskeletal:  Negative straight leg test Patellar and Achilles reflexes are brisk Normal plantar flexion and  extension Able to walk on toes and heels without difficulty          Assessment & Plan:   Low back pain.  Will continue symptomatic treatment. Hypertension, stable History of nephrolithiasis Migraine headaches  Patient information dispensed Schedule CPX

## 2015-01-24 NOTE — Progress Notes (Signed)
Pre visit review using our clinic review tool, if applicable. No additional management support is needed unless otherwise documented below in the visit note. 

## 2015-01-24 NOTE — Patient Instructions (Signed)
Most patients with low back pain will improve with time over the next two to 6 weeks.  Keep active but avoid any activities that cause pain.  Apply moist heat to the low back area several times daily.  Back Injury Prevention Back injuries can be extremely painful and difficult to heal. After having one back injury, you are much more likely to experience another later on. It is important to learn how to avoid injuring or re-injuring your back. The following tips can help you to prevent a back injury. PHYSICAL FITNESS  Exercise regularly and try to develop good tone in your abdominal muscles. Your abdominal muscles provide a lot of the support needed by your back.  Do aerobic exercises (walking, jogging, biking, swimming) regularly.  Do exercises that increase balance and strength (tai chi, yoga) regularly. This can decrease your risk of falling and injuring your back.  Stretch before and after exercising.  Maintain a healthy weight. The more you weigh, the more stress is placed on your back. For every pound of weight, 10 times that amount of pressure is placed on the back. DIET  Talk to your caregiver about how much calcium and vitamin D you need per day. These nutrients help to prevent weakening of the bones (osteoporosis). Osteoporosis can cause broken (fractured) bones that lead to back pain.  Include good sources of calcium in your diet, such as dairy products, green, leafy vegetables, and products with calcium added (fortified).  Include good sources of vitamin D in your diet, such as milk and foods that are fortified with vitamin D.  Consider taking a nutritional supplement or a multivitamin if needed.  Stop smoking if you smoke. POSTURE  Sit and stand up straight. Avoid leaning forward when you sit or hunching over when you stand.  Choose chairs with good low back (lumbar) support.  If you work at a desk, sit close to your work so you do not need to lean over. Keep your chin  tucked in. Keep your neck drawn back and elbows bent at a right angle. Your arms should look like the letter "L."  Sit high and close to the steering wheel when you drive. Add a lumbar support to your car seat if needed.  Avoid sitting or standing in one position for too long. Take breaks to get up, stretch, and walk around at least once every hour. Take breaks if you are driving for long periods of time.  Sleep on your side with your knees slightly bent, or sleep on your back with a pillow under your knees. Do not sleep on your stomach. LIFTING, TWISTING, AND REACHING  Avoid heavy lifting, especially repetitive lifting. If you must do heavy lifting:  Stretch before lifting.  Work slowly.  Rest between lifts.  Use carts and dollies to move objects when possible.  Make several small trips instead of carrying 1 heavy load.  Ask for help when you need it.  Ask for help when moving big, awkward objects.  Follow these steps when lifting:  Stand with your feet shoulder-width apart.  Get as close to the object as you can. Do not try to pick up heavy objects that are far from your body.  Use handles or lifting straps if they are available.  Bend at your knees. Squat down, but keep your heels off the floor.  Keep your shoulders pulled back, your chin tucked in, and your back straight.  Lift the object slowly, tightening the muscles in your legs,   abdomen, and buttocks. Keep the object as close to the center of your body as possible.  When you put a load down, use these same guidelines in reverse.  Do not:  Lift the object above your waist.  Twist at the waist while lifting or carrying a load. Move your feet if you need to turn, not your waist.  Bend over without bending at your knees.  Avoid reaching over your head, across a table, or for an object on a high surface. OTHER TIPS  Avoid wet floors and keep sidewalks clear of ice to prevent falls.  Do not sleep on a mattress  that is too soft or too hard.  Keep items that are used frequently within easy reach.  Put heavier objects on shelves at waist level and lighter objects on lower or higher shelves.  Find ways to decrease your stress, such as exercise, massage, or relaxation techniques. Stress can build up in your muscles. Tense muscles are more vulnerable to injury.  Seek treatment for depression or anxiety if needed. These conditions can increase your risk of developing back pain. SEEK MEDICAL CARE IF:  You injure your back.  You have questions about diet, exercise, or other ways to prevent back injuries. MAKE SURE YOU:  Understand these instructions.  Will watch your condition.  Will get help right away if you are not doing well or get worse. Document Released: 01/02/2005 Document Revised: 02/17/2012 Document Reviewed: 01/06/2012 [facility] Patient Information 2015 [place], [place]. This information is not intended to replace advice given to you by your health care provider. Make sure you discuss any questions you have with your health care provider. Back Exercises Back exercises help treat and prevent back injuries. The goal of back exercises is to increase the strength of your abdominal and back muscles and the flexibility of your back. These exercises should be started when you no longer have back pain. Back exercises include:  Pelvic Tilt. Lie on your back with your knees bent. Tilt your pelvis until the lower part of your back is against the floor. Hold this position 5 to 10 sec and repeat 5 to 10 times.  Knee to Chest. Pull first 1 knee up against your chest and hold for 20 to 30 seconds, repeat this with the other knee, and then both knees. This may be done with the other leg straight or bent, whichever feels better.  Sit-Ups or Curl-Ups. Bend your knees 90 degrees. Start with tilting your pelvis, and do a partial, slow sit-up, lifting your trunk only 30 to 45 degrees off the floor. Take at least  2 to 3 seconds for each sit-up. Do not do sit-ups with your knees out straight. If partial sit-ups are difficult, simply do the above but with only tightening your abdominal muscles and holding it as directed.  Hip-Lift. Lie on your back with your knees flexed 90 degrees. Push down with your feet and shoulders as you raise your hips a couple inches off the floor; hold for 10 seconds, repeat 5 to 10 times.  Back arches. Lie on your stomach, propping yourself up on bent elbows. Slowly press on your hands, causing an arch in your low back. Repeat 3 to 5 times. Any initial stiffness and discomfort should lessen with repetition over time.  Shoulder-Lifts. Lie face down with arms beside your body. Keep hips and torso pressed to floor as you slowly lift your head and shoulders off the floor. Do not overdo your exercises, especially in the  beginning. Exercises may cause you some mild back discomfort which lasts for a few minutes; however, if the pain is more severe, or lasts for more than 15 minutes, do not continue exercises until you see your caregiver. Improvement with exercise therapy for back problems is slow.  See your caregivers for assistance with developing a proper back exercise program. Document Released: 01/02/2005 Document Revised: 02/17/2012 Document Reviewed: 09/26/2011 [facility] Patient Information 2015 [place], [place]. This information is not intended to replace advice given to you by your health care provider. Make sure you discuss any questions you have with your health care provider. Sciatica Sciatica is pain, weakness, numbness, or tingling along the path of the sciatic nerve. The nerve starts in the lower back and runs down the back of each leg. The nerve controls the muscles in the lower leg and in the back of the knee, while also providing sensation to the back of the thigh, lower leg, and the sole of your foot. Sciatica is a symptom of another medical condition. For instance, nerve damage  or certain conditions, such as a herniated disk or bone spur on the spine, pinch or put pressure on the sciatic nerve. This causes the pain, weakness, or other sensations normally associated with sciatica. Generally, sciatica only affects one side of the body. CAUSES   Herniated or slipped disc.  Degenerative disk disease.  A pain disorder involving the narrow muscle in the buttocks (piriformis syndrome).  Pelvic injury or fracture.  Pregnancy.  Tumor (rare). SYMPTOMS  Symptoms can vary from mild to very severe. The symptoms usually travel from the low back to the buttocks and down the back of the leg. Symptoms can include:  Mild tingling or dull aches in the lower back, leg, or hip.  Numbness in the back of the calf or sole of the foot.  Burning sensations in the lower back, leg, or hip.  Sharp pains in the lower back, leg, or hip.  Leg weakness.  Severe back pain inhibiting movement. These symptoms may get worse with coughing, sneezing, laughing, or prolonged sitting or standing. Also, being overweight may worsen symptoms. DIAGNOSIS  Your caregiver will perform a physical exam to look for common symptoms of sciatica. He or she may ask you to do certain movements or activities that would trigger sciatic nerve pain. Other tests may be performed to find the cause of the sciatica. These may include:  Blood tests.  X-rays.  Imaging tests, such as an MRI or CT scan. TREATMENT  Treatment is directed at the cause of the sciatic pain. Sometimes, treatment is not necessary and the pain and discomfort goes away on its own. If treatment is needed, your caregiver may suggest:  Over-the-counter medicines to relieve pain.  Prescription medicines, such as anti-inflammatory medicine, muscle relaxants, or narcotics.  Applying heat or ice to the painful area.  Steroid injections to lessen pain, irritation, and inflammation around the nerve.  Reducing activity during periods of  pain.  Exercising and stretching to strengthen your abdomen and improve flexibility of your spine. Your caregiver may suggest losing weight if the extra weight makes the back pain worse.  Physical therapy.  Surgery to eliminate what is pressing or pinching the nerve, such as a bone spur or part of a herniated disk. HOME CARE INSTRUCTIONS   Only take over-the-counter or prescription medicines for pain or discomfort as directed by your caregiver.  Apply ice to the affected area for 20 minutes, 3-4 times a day for the first  48-72 hours. Then try heat in the same way.  Exercise, stretch, or perform your usual activities if these do not aggravate your pain.  Attend physical therapy sessions as directed by your caregiver.  Keep all follow-up appointments as directed by your caregiver.  Do not wear high heels or shoes that do not provide proper support.  Check your mattress to see if it is too soft. A firm mattress may lessen your pain and discomfort. SEEK IMMEDIATE MEDICAL CARE IF:   You lose control of your bowel or bladder (incontinence).  You have increasing weakness in the lower back, pelvis, buttocks, or legs.  You have redness or swelling of your back.  You have a burning sensation when you urinate.  You have pain that gets worse when you lie down or awakens you at night.  Your pain is worse than you have experienced in the past.  Your pain is lasting longer than 4 weeks.  You are suddenly losing weight without reason. MAKE SURE YOU:  Understand these instructions.  Will watch your condition.  Will get help right away if you are not doing well or get worse. Document Released: 11/19/2001 Document Revised: 05/26/2012 Document Reviewed: 04/05/2012 [facility] Patient Information 2015 [place], [place]. This information is not intended to replace advice given to you by your health care provider. Make sure you discuss any questions you have with your health care provider.

## 2015-02-16 ENCOUNTER — Other Ambulatory Visit: Payer: Self-pay | Admitting: Internal Medicine

## 2015-02-16 ENCOUNTER — Telehealth: Payer: Self-pay | Admitting: Internal Medicine

## 2015-02-16 NOTE — Telephone Encounter (Signed)
Patient would like a re-fill on HYDROcodone-acetaminophen (NORCO/VICODIN) 5-325 MG per tablet. °

## 2015-02-17 MED ORDER — HYDROCODONE-ACETAMINOPHEN 5-325 MG PO TABS: ORAL_TABLET | ORAL | Status: DC

## 2015-02-17 NOTE — Telephone Encounter (Signed)
Pt notified Rx ready for pickup. Rx printed and signed.  

## 2015-03-09 ENCOUNTER — Telehealth: Payer: Self-pay | Admitting: Internal Medicine

## 2015-03-09 NOTE — Telephone Encounter (Signed)
Pt need new rx hydrocodone °

## 2015-03-10 MED ORDER — HYDROCODONE-ACETAMINOPHEN 5-325 MG PO TABS: ORAL_TABLET | ORAL | Status: DC

## 2015-03-10 NOTE — Telephone Encounter (Signed)
Left message Rx ready for pickup. Rx printed and signed.  

## 2015-03-26 ENCOUNTER — Other Ambulatory Visit: Payer: Self-pay | Admitting: Internal Medicine

## 2015-04-03 ENCOUNTER — Telehealth: Payer: Self-pay | Admitting: Internal Medicine

## 2015-04-03 NOTE — Telephone Encounter (Signed)
Pt needs new rx hydrocodone °

## 2015-04-04 MED ORDER — HYDROCODONE-ACETAMINOPHEN 5-325 MG PO TABS: ORAL_TABLET | ORAL | Status: DC

## 2015-04-04 NOTE — Telephone Encounter (Signed)
Left detailed message Rx ready for pickup. Rx printed and signed by Dr. Sherren Mocha.

## 2015-04-07 ENCOUNTER — Other Ambulatory Visit: Payer: 59

## 2015-04-09 ENCOUNTER — Other Ambulatory Visit: Payer: Self-pay | Admitting: Internal Medicine

## 2015-04-14 ENCOUNTER — Encounter: Payer: 59 | Admitting: Internal Medicine

## 2015-05-04 ENCOUNTER — Telehealth: Payer: Self-pay | Admitting: Internal Medicine

## 2015-05-04 MED ORDER — HYDROCODONE-ACETAMINOPHEN 5-325 MG PO TABS: ORAL_TABLET | ORAL | Status: DC

## 2015-05-04 NOTE — Telephone Encounter (Signed)
Left detailed message Rx ready for pickup, will be at the front desk. Rx printed and signed. 

## 2015-05-04 NOTE — Telephone Encounter (Signed)
Pt needs new rx hydrocodone °

## 2015-05-11 ENCOUNTER — Telehealth: Payer: Self-pay | Admitting: Internal Medicine

## 2015-05-11 NOTE — Telephone Encounter (Signed)
LM for the pt to return my call. 

## 2015-05-11 NOTE — Telephone Encounter (Signed)
Contacted [facility] [employer] due to no note in encounter. [facility] [employer] representative states she was able to see the note and would fax it over. States patient having back pain and does not seem acute.

## 2015-05-11 NOTE — Telephone Encounter (Signed)
Spoke to the pt.  Has an ongoing right leg pain.  Now has new left leg tingling.  Pain is worsening.  Stated that in the past Dr. Raliegh Ip has wanted him to have imaging done but he did not want to spend the money.  Now he wants the imaging to be ordered.  Also has re started prednisone that was given to him in the past for this problem.  Pt placed on Nafziger's schedule on 05/12/15 @ 11 AM.  Nafziger notified.

## 2015-05-12 ENCOUNTER — Encounter: Payer: Self-pay | Admitting: Adult Health

## 2015-05-12 ENCOUNTER — Ambulatory Visit (INDEPENDENT_AMBULATORY_CARE_PROVIDER_SITE_OTHER)
Admission: RE | Admit: 2015-05-12 | Discharge: 2015-05-12 | Disposition: A | Discharge status: Home / Self Care | Payer: 59 | Source: Ambulatory Visit | Attending: Adult Health | Admitting: Adult Health

## 2015-05-12 ENCOUNTER — Ambulatory Visit (INDEPENDENT_AMBULATORY_CARE_PROVIDER_SITE_OTHER): Payer: 59 | Admitting: Adult Health

## 2015-05-12 VITALS — BP 110/75 | Temp 98.1°F | Ht 70.0 in | Wt 183.0 lb

## 2015-05-12 DIAGNOSIS — M5442 Lumbago with sciatica, left side: Secondary | ICD-10-CM

## 2015-05-12 DIAGNOSIS — M5416 Radiculopathy, lumbar region: Secondary | ICD-10-CM | POA: Diagnosis not present

## 2015-05-12 MED ORDER — OXYCODONE-ACETAMINOPHEN 10-325 MG PO TABS: 1.0000 | ORAL_TABLET | ORAL | Status: DC | PRN

## 2015-05-12 MED ORDER — METHYLPREDNISOLONE ACETATE 80 MG/ML IJ SUSP
160.0000 mg | Freq: Once | INTRAMUSCULAR | Status: AC
  Administered 2015-05-12: 160 mg via INTRAMUSCULAR

## 2015-05-12 NOTE — Progress Notes (Signed)
Subjective:    Patient ID: Dustin Henry, male    DOB: [birth year], 44 y.o.   MRN: [id number]  HPI  Patient presents to the office today. Has chronic back pain and has seen Dr. Raliegh Ip in the past for this issue. Today the pain is getting worse  Two weeks ago was mowing yard and left leg cramped up on him. Pain became worse over the same day and started having burning pain that lasted for two hours. Used Norco, flexeril and valium which helped a little bit.   Last weekend he had the same pain and burning sensation for about five hours in the same leg. He took the same drug combination as the prior week which helped and he stayed in bed for two days. The pain has resolved but he continues to have numbness and tingling.   Every day this week he has had tingling in his left leg. When he bends over he gets the burning pain in his left leg. Has started to have right leg pain. Took flexeril and Vicodin this morning - minimal relief.   Saturday started a prednisone taper of 20 mg, 40mg  Sunday, 30mg  Monday, Missed dose on Tuesday and 20mg  on Wednesday, 20 mg on Thursday  and has been tapering since.   Denies problems with bowel or bladder.   Review of Systems  Constitutional: Positive for activity change.  Eyes: Negative.   Gastrointestinal: Negative for nausea, vomiting, abdominal pain, diarrhea, constipation, abdominal distention and rectal pain.  Endocrine: Negative.   Genitourinary: Negative for urgency, decreased urine volume, difficulty urinating and testicular pain.  Musculoskeletal: Positive for back pain and gait problem. Negative for myalgias, joint swelling, neck pain and neck stiffness.  Skin: Negative.   Allergic/Immunologic: Negative.   Neurological: Positive for numbness. Negative for dizziness, tremors, weakness, light-headedness and headaches.  Hematological: Negative.   Psychiatric/Behavioral: Negative.   All other systems reviewed and are negative.  Past Medical History    Diagnosis Date  . ALLERGIC RHINITIS 08/18/2007  . ANXIETY 02/09/2010  . BACK PAIN 03/09/2010  . HYPERLIPIDEMIA 02/12/2010  . HYPERTENSION 02/09/2010  . MIGRAINE HEADACHE 08/18/2007  . IBS (irritable bowel syndrome)     History   Social History  . Marital Status: Married    Spouse Name: N/A  . Number of Children: 1  . Years of Education: N/A   Occupational History  . pressman helper     Engineer, structural   Social History Main Topics  . Smoking status: Former Smoker -- 3.00 packs/day for 10 years    Types: Cigarettes    Quit date: 12/09/2004  . Smokeless tobacco: Never Used  . Alcohol Use: Yes  . Drug Use: No  . Sexual Activity: Not on file   Other Topics Concern  . Not on file   Social History Narrative    Past Surgical History  Procedure Laterality Date  . Kidney stones    . Finger surgery      No family history on file.  Allergies  Allergen Reactions  . Doxycycline     REACTION: nausea  . Penicillins     REACTION: unspecified    Current Outpatient Prescriptions on File Prior to Visit  Medication Sig Dispense Refill  . cyclobenzaprine (FLEXERIL) 10 MG tablet TAKE 1 TABLET BY MOUTH 3 TIMES DAILY AS NEEDED 60 tablet 2  . diazepam (VALIUM) 5 MG tablet TAKE 1 TABLET BY MOUTH EVERY 12 HOURS AS NEEDED FOR ANXIETY 60 tablet 2  . HYDROcodone-acetaminophen (  NORCO/VICODIN) 5-325 MG per tablet TAKE 1 TABLET EVERY 6 HOURS AS NEEDED FOR PAIN 60 tablet 0  . lisinopril-hydrochlorothiazide (PRINZIDE,ZESTORETIC) 20-25 MG per tablet TAKE 1 TABLET BY MOUTH EVERY DAY 90 tablet 1  . meloxicam (MOBIC) 15 MG tablet TAKE 1 TABLET BY MOUTH ONCE DAILY 90 tablet 1  . PARoxetine (PAXIL) 20 MG tablet TAKE 1 TABLET BY MOUTH EVERY MORNING *PT NEEDS OFFICE VISIT* 90 tablet 1  . promethazine (PHENERGAN) 25 MG tablet TAKE 1 TABLET BY MOUTH EVERY 8 HOURS AS NEEDED 30 tablet 2  . SUMAtriptan (IMITREX) 100 MG tablet Take 1 tablet (100 mg total) by mouth every 2 (two) hours as needed for migraine or  headache. May repeat in 2 hours if headache persists or recurs. 3 tablet 2  . ibuprofen (ADVIL,MOTRIN) 200 MG tablet Take 800 mg by mouth every 6 (six) hours as needed for pain.     No current facility-administered medications on file prior to visit.    BP 110/75 mmHg  Temp(Src) 98.1 F (36.7 C) (Oral)  Ht 5\' 10"  (1.778 m)  Wt 183 lb (83.008 kg)  BMI 26.26 kg/m2       Objective:   Physical Exam  Constitutional: He is oriented to person, place, and time. He appears well-developed and well-nourished. No distress.  Eyes: Conjunctivae are normal. Pupils are equal, round, and reactive to light.  Cardiovascular: Normal rate, regular rhythm, normal heart sounds and intact distal pulses.  Exam reveals no gallop and no friction rub.   No murmur heard. Pulmonary/Chest: Effort normal and breath sounds normal. No respiratory distress. He has no wheezes. He has no rales. He exhibits no tenderness.  Abdominal: Soft. Bowel sounds are normal. He exhibits no distension. There is no tenderness.  Genitourinary: Rectum normal. Guaiac negative stool.  Normal rectal tone  Musculoskeletal: Normal range of motion. He exhibits tenderness. He exhibits no edema.  Tenderness with palpation at and around L1. Tenderness with palpation down entire left leg  Neurological: He is alert and oriented to person, place, and time. He has normal reflexes. Coordination normal.  Skin: Skin is warm and dry. No rash noted. He is not diaphoretic. No erythema. No pallor.  Psychiatric: He has a normal mood and affect. His behavior is normal. Judgment and thought content normal.  Nursing note and vitals reviewed.      Assessment & Plan:  1. Left-sided low back pain with left-sided sciatica - DG Lumbar Spine Complete; Future - DG Thoracic Spine W/Swimmers; Future - Ambulatory referral to Neurology - methylPREDNISolone acetate (DEPO-MEDROL) injection 160 mg; Inject 2 mLs (160 mg total) into the muscle once.  2. Lumbar  radiculopathy - DG Lumbar Spine Complete; Future - DG Thoracic Spine W/Swimmers; Future - Ambulatory referral to Neurology - methylPREDNISolone acetate (DEPO-MEDROL) injection 160 mg; Inject 2 mLs (160 mg total) into the muscle once. - MR Lumbar Spine Wo Contrast; Future - Dr. Raliegh Ip notified of plan and he is in agreement

## 2015-05-12 NOTE — Patient Instructions (Signed)
I will let you know what the x ray shows. I will set follow up with neurology and an MRI. Call your insurance company and let me know what they say about an MRI

## 2015-05-19 ENCOUNTER — Ambulatory Visit (INDEPENDENT_AMBULATORY_CARE_PROVIDER_SITE_OTHER): Payer: 59 | Admitting: Internal Medicine

## 2015-05-19 ENCOUNTER — Encounter: Payer: Self-pay | Admitting: Internal Medicine

## 2015-05-19 VITALS — BP 130/90 | HR 100 | Temp 97.7°F | Wt 177.0 lb

## 2015-05-19 DIAGNOSIS — I10 Essential (primary) hypertension: Secondary | ICD-10-CM

## 2015-05-19 DIAGNOSIS — M5440 Lumbago with sciatica, unspecified side: Secondary | ICD-10-CM | POA: Diagnosis not present

## 2015-05-19 MED ORDER — DIAZEPAM 5 MG PO TABS: ORAL_TABLET | ORAL | Status: DC

## 2015-05-19 NOTE — Progress Notes (Signed)
Subjective:    Patient ID: Dustin Henry, male    DOB: 1971/03/14, 44 y.o.   MRN: [id number]  HPI  44 year old patient who has a history of intermittent low back pain.  He had a significant flare approximately 3 weeks ago when he was doing yard work.  He states that his left leg"locked up"and became quite weak for about an hour and a half.  He has noted pain involving the low back as well as left leg.  He describes a constant tingling sensation.  Yesterday he had the onset of right leg pain.  Denies any clear GI or GU symptoms but does state that perhaps his urine flow has become slightly more sluggish.  No incontinence Radiographs were taken recently that revealed minimal degenerative changes only.  He has been on analgesics, muscle relaxants and has had a prednisone taper.  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  An MRI is scheduled in 4 days.  He does have a one-week vacation planned in about 2 weeks  Past Medical History  Diagnosis Date  . ALLERGIC RHINITIS 08/18/2007  . ANXIETY 02/09/2010  . BACK PAIN 03/09/2010  . HYPERLIPIDEMIA 02/12/2010  . HYPERTENSION 02/09/2010  . MIGRAINE HEADACHE 08/18/2007  . IBS (irritable bowel syndrome)     History   Social History  . Marital Status: Married    Spouse Name: N/A  . Number of Children: 1  . Years of Education: N/A   Occupational History  . pressman helper     Engineer, structural   Social History Main Topics  . Smoking status: Former Smoker -- 3.00 packs/day for 10 years    Types: Cigarettes    Quit date: 12/09/2004  . Smokeless tobacco: Never Used  . Alcohol Use: Yes  . Drug Use: No  . Sexual Activity: Not on file   Other Topics Concern  . Not on file   Social History Narrative    Past Surgical History  Procedure Laterality Date  . Kidney stones    . Finger surgery      No family history on file.  Allergies  Allergen Reactions  . Doxycycline     REACTION: nausea  . Penicillins     REACTION: unspecified     Current Outpatient Prescriptions on File Prior to Visit  Medication Sig Dispense Refill  . HYDROcodone-acetaminophen (NORCO/VICODIN) 5-325 MG per tablet TAKE 1 TABLET EVERY 6 HOURS AS NEEDED FOR PAIN 60 tablet 0  . ibuprofen (ADVIL,MOTRIN) 200 MG tablet Take 800 mg by mouth every 6 (six) hours as needed for pain.    Marland Kitchen lisinopril-hydrochlorothiazide (PRINZIDE,ZESTORETIC) 20-25 MG per tablet TAKE 1 TABLET BY MOUTH EVERY DAY 90 tablet 1  . meloxicam (MOBIC) 15 MG tablet TAKE 1 TABLET BY MOUTH ONCE DAILY 90 tablet 1  . PARoxetine (PAXIL) 20 MG tablet TAKE 1 TABLET BY MOUTH EVERY MORNING *PT NEEDS OFFICE VISIT* 90 tablet 1  . promethazine (PHENERGAN) 25 MG tablet TAKE 1 TABLET BY MOUTH EVERY 8 HOURS AS NEEDED 30 tablet 2  . SUMAtriptan (IMITREX) 100 MG tablet Take 1 tablet (100 mg total) by mouth every 2 (two) hours as needed for migraine or headache. May repeat in 2 hours if headache persists or recurs. 3 tablet 2   No current facility-administered medications on file prior to visit.    BP 130/90 mmHg  Pulse 100  Temp(Src) 97.7 F (36.5 C) (Oral)  Wt 177 lb (80.287 kg)     Review of Systems  Constitutional: Negative for fever, chills, appetite change and fatigue.  HENT: Negative for congestion, dental problem, ear pain, hearing loss, sore throat, tinnitus, trouble swallowing and voice change.   Eyes: Negative for pain, discharge and visual disturbance.  Respiratory: Negative for cough, chest tightness, wheezing and stridor.   Cardiovascular: Negative for chest pain, palpitations and leg swelling.  Gastrointestinal: Negative for nausea, vomiting, abdominal pain, diarrhea, constipation, blood in stool and abdominal distention.  Genitourinary: Negative for urgency, hematuria, flank pain, discharge, difficulty urinating and genital sores.  Musculoskeletal: Positive for back pain. Negative for myalgias, joint swelling, arthralgias, gait problem and neck stiffness.  Skin: Negative for  rash.  Neurological: Positive for numbness. Negative for dizziness, syncope, speech difficulty, weakness and headaches.  Hematological: Negative for adenopathy. Does not bruise/bleed easily.  Psychiatric/Behavioral: Negative for behavioral problems and dysphoric mood. The patient is not nervous/anxious.        Objective:   Physical Exam  Constitutional: He appears well-nourished. No distress.  Musculoskeletal:  Straight leg testing bilaterally causes ipsilateral posterior leg discomfort but no back pain at 30  Neurological:  No motor weakness.  Patient is able to easily walk on his toes and heels Patellar and Achilles reflexes are both brisk and equal          Assessment & Plan:   Acute on chronic low back pain.  Indications for consideration of surgery discussed.  MRI will be a significant out-of-pocket expense.  The patient will continue to rest and modify his activities over the weekend if improved, he'll consider canceling the lumbar MRI early next week.  If he develops persistent or worsening symptoms.  Will follow through on the lumbar MRI Essential hypertension, stable

## 2015-05-19 NOTE — Progress Notes (Signed)
Pre visit review using our clinic review tool, if applicable. No additional management support is needed unless otherwise documented below in the visit note. 

## 2015-05-19 NOTE — Patient Instructions (Signed)
Most patients with low back pain will improve with time over the next two to 6 weeks.  Keep active but avoid any activities that cause pain.  Apply moist heat to the low back area several times daily.  Sciatica Sciatica is pain, weakness, numbness, or tingling along the path of the sciatic nerve. The nerve starts in the lower back and runs down the back of each leg. The nerve controls the muscles in the lower leg and in the back of the knee, while also providing sensation to the back of the thigh, lower leg, and the sole of your foot. Sciatica is a symptom of another medical condition. For instance, nerve damage or certain conditions, such as a herniated disk or bone spur on the spine, pinch or put pressure on the sciatic nerve. This causes the pain, weakness, or other sensations normally associated with sciatica. Generally, sciatica only affects one side of the body. CAUSES   Herniated or slipped disc.  Degenerative disk disease.  A pain disorder involving the narrow muscle in the buttocks (piriformis syndrome).  Pelvic injury or fracture.  Pregnancy.  Tumor (rare). SYMPTOMS  Symptoms can vary from mild to very severe. The symptoms usually travel from the low back to the buttocks and down the back of the leg. Symptoms can include:  Mild tingling or dull aches in the lower back, leg, or hip.  Numbness in the back of the calf or sole of the foot.  Burning sensations in the lower back, leg, or hip.  Sharp pains in the lower back, leg, or hip.  Leg weakness.  Severe back pain inhibiting movement. These symptoms may get worse with coughing, sneezing, laughing, or prolonged sitting or standing. Also, being overweight may worsen symptoms. DIAGNOSIS  Your caregiver will perform a physical exam to look for common symptoms of sciatica. He or she may ask you to do certain movements or activities that would trigger sciatic nerve pain. Other tests may be performed to find the cause of the  sciatica. These may include:  Blood tests.  X-rays.  Imaging tests, such as an MRI or CT scan. TREATMENT  Treatment is directed at the cause of the sciatic pain. Sometimes, treatment is not necessary and the pain and discomfort goes away on its own. If treatment is needed, your caregiver may suggest:  Over-the-counter medicines to relieve pain.  Prescription medicines, such as anti-inflammatory medicine, muscle relaxants, or narcotics.  Applying heat or ice to the painful area.  Steroid injections to lessen pain, irritation, and inflammation around the nerve.  Reducing activity during periods of pain.  Exercising and stretching to strengthen your abdomen and improve flexibility of your spine. Your caregiver may suggest losing weight if the extra weight makes the back pain worse.  Physical therapy.  Surgery to eliminate what is pressing or pinching the nerve, such as a bone spur or part of a herniated disk. HOME CARE INSTRUCTIONS   Only take over-the-counter or prescription medicines for pain or discomfort as directed by your caregiver.  Apply ice to the affected area for 20 minutes, 3-4 times a day for the first 48-72 hours. Then try heat in the same way.  Exercise, stretch, or perform your usual activities if these do not aggravate your pain.  Attend physical therapy sessions as directed by your caregiver.  Keep all follow-up appointments as directed by your caregiver.  Do not wear high heels or shoes that do not provide proper support.  Check your mattress to see if  it is too soft. A firm mattress may lessen your pain and discomfort. SEEK IMMEDIATE MEDICAL CARE IF:   You lose control of your bowel or bladder (incontinence).  You have increasing weakness in the lower back, pelvis, buttocks, or legs.  You have redness or swelling of your back.  You have a burning sensation when you urinate.  You have pain that gets worse when you lie down or awakens you at  night.  Your pain is worse than you have experienced in the past.  Your pain is lasting longer than 4 weeks.  You are suddenly losing weight without reason. MAKE SURE YOU:  Understand these instructions.  Will watch your condition.  Will get help right away if you are not doing well or get worse. Document Released: 11/19/2001 Document Revised: 05/26/2012 Document Reviewed: 04/05/2012 [facility] Patient Information 2015 [place], [place]. This information is not intended to replace advice given to you by your health care provider. Make sure you discuss any questions you have with your health care provider.

## 2015-05-22 ENCOUNTER — Ambulatory Visit (INDEPENDENT_AMBULATORY_CARE_PROVIDER_SITE_OTHER): Payer: 59 | Admitting: Internal Medicine

## 2015-05-22 ENCOUNTER — Encounter: Payer: Self-pay | Admitting: Internal Medicine

## 2015-05-22 VITALS — BP 150/90 | HR 77 | Temp 98.3°F | Resp 20 | Ht 70.0 in | Wt 183.0 lb

## 2015-05-22 DIAGNOSIS — M5440 Lumbago with sciatica, unspecified side: Secondary | ICD-10-CM | POA: Diagnosis not present

## 2015-05-22 DIAGNOSIS — L03012 Cellulitis of left finger: Secondary | ICD-10-CM

## 2015-05-22 MED ORDER — CEPHALEXIN 500 MG PO CAPS: 500.0000 mg | ORAL_CAPSULE | Freq: Four times a day (QID) | ORAL | Status: DC

## 2015-05-22 NOTE — Progress Notes (Signed)
Pre visit review using our clinic review tool, if applicable. No additional management support is needed unless otherwise documented below in the visit note. 

## 2015-05-22 NOTE — Patient Instructions (Signed)

## 2015-05-22 NOTE — Progress Notes (Signed)
Subjective:    Patient ID: Dustin Henry, male    DOB: [birth year], 44 y.o.   MRN: [id number]  HPI 44 year old patient was seen 3 days ago with back pain.  This has improved modestly, and he has canceled his lumbar MRI History of complaint today is pain and swelling involving his left distal index finger.  Past Medical History  Diagnosis Date  . ALLERGIC RHINITIS 08/18/2007  . ANXIETY 02/09/2010  . BACK PAIN 03/09/2010  . HYPERLIPIDEMIA 02/12/2010  . HYPERTENSION 02/09/2010  . MIGRAINE HEADACHE 08/18/2007  . IBS (irritable bowel syndrome)     History   Social History  . Marital Status: Married    Spouse Name: N/A  . Number of Children: 1  . Years of Education: N/A   Occupational History  . pressman helper     Engineer, structural   Social History Main Topics  . Smoking status: Former Smoker -- 3.00 packs/day for 10 years    Types: Cigarettes    Quit date: 12/09/2004  . Smokeless tobacco: Never Used  . Alcohol Use: Yes  . Drug Use: No  . Sexual Activity: Not on file   Other Topics Concern  . Not on file   Social History Narrative    Past Surgical History  Procedure Laterality Date  . Kidney stones    . Finger surgery      No family history on file.  Allergies  Allergen Reactions  . Doxycycline     REACTION: nausea  . Penicillins     REACTION: unspecified    Current Outpatient Prescriptions on File Prior to Visit  Medication Sig Dispense Refill  . diazepam (VALIUM) 5 MG tablet TAKE 1 TABLET BY MOUTH EVERY 12 HOURS AS NEEDED FOR ANXIETY 60 tablet 2  . HYDROcodone-acetaminophen (NORCO/VICODIN) 5-325 MG per tablet TAKE 1 TABLET EVERY 6 HOURS AS NEEDED FOR PAIN 60 tablet 0  . ibuprofen (ADVIL,MOTRIN) 200 MG tablet Take 800 mg by mouth every 6 (six) hours as needed for pain.    Marland Kitchen lisinopril-hydrochlorothiazide (PRINZIDE,ZESTORETIC) 20-25 MG per tablet TAKE 1 TABLET BY MOUTH EVERY DAY 90 tablet 1  . meloxicam (MOBIC) 15 MG tablet TAKE 1 TABLET BY MOUTH ONCE DAILY 90  tablet 1  . PARoxetine (PAXIL) 20 MG tablet TAKE 1 TABLET BY MOUTH EVERY MORNING *PT NEEDS OFFICE VISIT* 90 tablet 1  . promethazine (PHENERGAN) 25 MG tablet TAKE 1 TABLET BY MOUTH EVERY 8 HOURS AS NEEDED 30 tablet 2  . SUMAtriptan (IMITREX) 100 MG tablet Take 1 tablet (100 mg total) by mouth every 2 (two) hours as needed for migraine or headache. May repeat in 2 hours if headache persists or recurs. 3 tablet 2   No current facility-administered medications on file prior to visit.    BP 150/90 mmHg  Pulse 77  Temp(Src) 98.3 F (36.8 C) (Oral)  Resp 20  Ht 5\' 10"  (1.778 m)  Wt 183 lb (83.008 kg)  BMI 26.26 kg/m2  SpO2 98%      Review of Systems  Musculoskeletal: Positive for back pain.  Skin: Positive for wound.       Objective:   Physical Exam  Constitutional: He appears well-developed and well-nourished. No distress.  Skin:  Acute paronychia involving the left index finger.  Fluctuance noted along the medial aspect  Procedure note.  After local prep and anesthesia, A small incision was made and the exudate drained.  Antibiotic ointment applied as well as a sterile dressing  Assessment & Plan:   Acute paronychia left index finger Status post I&D Low back pain.  Modest improvement  Information dispensed We'll treat with 7 days of oral antibiotics Local wound care discussed

## 2015-05-23 ENCOUNTER — Ambulatory Visit (HOSPITAL_COMMUNITY): Payer: 59

## 2015-05-24 ENCOUNTER — Telehealth: Payer: Self-pay | Admitting: Internal Medicine

## 2015-05-24 MED ORDER — HYDROCODONE-ACETAMINOPHEN 5-325 MG PO TABS: ORAL_TABLET | ORAL | Status: DC

## 2015-05-24 NOTE — Telephone Encounter (Signed)
Okay to refill Hydrocodone.  

## 2015-05-24 NOTE — Telephone Encounter (Signed)
ok 

## 2015-05-24 NOTE — Telephone Encounter (Signed)
Pt notified Rx ready for pickup. Rx printed and signed.  

## 2015-05-24 NOTE — Telephone Encounter (Signed)
Pt request refill of the following: HYDROcodone-acetaminophen (NORCO/VICODIN) 5-325 MG per tablet ° ° °Phamacy: °

## 2015-06-16 ENCOUNTER — Encounter: Payer: Self-pay | Admitting: Internal Medicine

## 2015-06-16 ENCOUNTER — Ambulatory Visit (INDEPENDENT_AMBULATORY_CARE_PROVIDER_SITE_OTHER): Payer: 59 | Admitting: Internal Medicine

## 2015-06-16 VITALS — BP 150/90 | HR 92 | Temp 98.6°F | Resp 20 | Ht 70.0 in | Wt 186.0 lb

## 2015-06-16 DIAGNOSIS — I10 Essential (primary) hypertension: Secondary | ICD-10-CM | POA: Diagnosis not present

## 2015-06-16 DIAGNOSIS — M549 Dorsalgia, unspecified: Secondary | ICD-10-CM | POA: Diagnosis not present

## 2015-06-16 DIAGNOSIS — M545 Low back pain, unspecified: Secondary | ICD-10-CM | POA: Insufficient documentation

## 2015-06-16 DIAGNOSIS — G8929 Other chronic pain: Secondary | ICD-10-CM

## 2015-06-16 MED ORDER — HYDROCODONE-ACETAMINOPHEN 5-325 MG PO TABS: ORAL_TABLET | ORAL | Status: DC

## 2015-06-16 NOTE — Progress Notes (Signed)
Pre visit review using our clinic review tool, if applicable. No additional management support is needed unless otherwise documented below in the visit note. 

## 2015-06-16 NOTE — Progress Notes (Signed)
Subjective:    Patient ID: Dustin Henry, male    DOB: 02/22/1971, 44 y.o.   MRN: [id number]  HPI 44 year old patient who has a long history of chronic low back pain.  He works as a Development worker, international aid.  Symptoms have intensified over the past 2 or 3 months with increasing analgesic use.  Today he states he feels well. Even while on vacation 2 weeks ago, he stated that he was incapacitated for 3 days.  Pain is aggravated by twisting at the waist and bending and stooping.  He describes pain and tingling that affects both legs.  No motor weakness  Lumbar x-rays have revealed minimal degenerative changes.  He has been reluctant to consider a lumbar MRI due to the cost and I have felt that the yield of this study would be very low  He has been giving back exercises/stretching maneuvers in the past, which he states he does perform when his back is doing relatively well. He states that he benefits from diazepam as a muscle relaxer, but this causes sedation.  Flexeril wasn't effective  He states that he has been on Vicodin chronically and with acute flares Takes 2 tablets 3-4 times daily. A UDS in 2014 was negative for metabolites of hydrocodone.  He is requesting another refill today  Past Medical History  Diagnosis Date  . ALLERGIC RHINITIS 08/18/2007  . ANXIETY 02/09/2010  . BACK PAIN 03/09/2010  . HYPERLIPIDEMIA 02/12/2010  . HYPERTENSION 02/09/2010  . MIGRAINE HEADACHE 08/18/2007  . IBS (irritable bowel syndrome)     History   Social History  . Marital Status: Married    Spouse Name: N/A  . Number of Children: 1  . Years of Education: N/A   Occupational History  . pressman helper     Engineer, structural   Social History Main Topics  . Smoking status: Former Smoker -- 3.00 packs/day for 10 years    Types: Cigarettes    Quit date: 12/09/2004  . Smokeless tobacco: Never Used  . Alcohol Use: Yes  . Drug Use: No  . Sexual Activity: Not on file   Other Topics Concern  . Not on file   Social  History Narrative    Past Surgical History  Procedure Laterality Date  . Kidney stones    . Finger surgery      No family history on file.  Allergies  Allergen Reactions  . Doxycycline     REACTION: nausea  . Penicillins     REACTION: unspecified    Current Outpatient Prescriptions on File Prior to Visit  Medication Sig Dispense Refill  . diazepam (VALIUM) 5 MG tablet TAKE 1 TABLET BY MOUTH EVERY 12 HOURS AS NEEDED FOR ANXIETY 60 tablet 2  . ibuprofen (ADVIL,MOTRIN) 200 MG tablet Take 800 mg by mouth every 6 (six) hours as needed for pain.    Marland Kitchen lisinopril-hydrochlorothiazide (PRINZIDE,ZESTORETIC) 20-25 MG per tablet TAKE 1 TABLET BY MOUTH EVERY DAY 90 tablet 1  . meloxicam (MOBIC) 15 MG tablet TAKE 1 TABLET BY MOUTH ONCE DAILY 90 tablet 1  . PARoxetine (PAXIL) 20 MG tablet TAKE 1 TABLET BY MOUTH EVERY MORNING *PT NEEDS OFFICE VISIT* 90 tablet 1  . promethazine (PHENERGAN) 25 MG tablet TAKE 1 TABLET BY MOUTH EVERY 8 HOURS AS NEEDED 30 tablet 2  . SUMAtriptan (IMITREX) 100 MG tablet Take 1 tablet (100 mg total) by mouth every 2 (two) hours as needed for migraine or headache. May repeat in 2 hours if headache persists or  recurs. 3 tablet 2   No current facility-administered medications on file prior to visit.    BP 150/90 mmHg  Pulse 92  Temp(Src) 98.6 F (37 C) (Oral)  Resp 20  Ht 5\' 10"  (1.778 m)  Wt 186 lb (84.369 kg)  BMI 26.69 kg/m2  SpO2 97%      Review of Systems  Musculoskeletal: Positive for back pain.       Objective:   Physical Exam  Constitutional: He appears well-developed and well-nourished. No distress.  Musculoskeletal:  Bilateral straight leg test at 30 caused subjective pain and paresthesias involving the posterior aspects of both legs, but no back pain  Reflexes were brisk and equal No motor weakness          Assessment & Plan:   Chronic low back pain.  Will check another urine drug screen.  Vicodin refilled. Concerns over chronic  narcotic use.  Discussed at length.  We'll refer to rheumatology to further evaluate and treat low back pain Essential hypertension

## 2015-06-16 NOTE — Patient Instructions (Signed)
Back Injury Prevention Back injuries can be extremely painful and difficult to heal. After having one back injury, you are much more likely to experience another later on. It is important to learn how to avoid injuring or re-injuring your back. The following tips can help you to prevent a back injury. PHYSICAL FITNESS  Exercise regularly and try to develop good tone in your abdominal muscles. Your abdominal muscles provide a lot of the support needed by your back.  Do aerobic exercises (walking, jogging, biking, swimming) regularly.  Do exercises that increase balance and strength (tai chi, yoga) regularly. This can decrease your risk of falling and injuring your back.  Stretch before and after exercising.  Maintain a healthy weight. The more you weigh, the more stress is placed on your back. For every pound of weight, 10 times that amount of pressure is placed on the back. DIET  Talk to your caregiver about how much calcium and vitamin D you need per day. These nutrients help to prevent weakening of the bones (osteoporosis). Osteoporosis can cause broken (fractured) bones that lead to back pain.  Include good sources of calcium in your diet, such as dairy products, green, leafy vegetables, and products with calcium added (fortified).  Include good sources of vitamin D in your diet, such as milk and foods that are fortified with vitamin D.  Consider taking a nutritional supplement or a multivitamin if needed.  Stop smoking if you smoke. POSTURE  Sit and stand up straight. Avoid leaning forward when you sit or hunching over when you stand.  Choose chairs with good low back (lumbar) support.  If you work at a desk, sit close to your work so you do not need to lean over. Keep your chin tucked in. Keep your neck drawn back and elbows bent at a right angle. Your arms should look like the letter "L."  Sit high and close to the steering wheel when you drive. Add a lumbar support to your car  seat if needed.  Avoid sitting or standing in one position for too long. Take breaks to get up, stretch, and walk around at least once every hour. Take breaks if you are driving for long periods of time.  Sleep on your side with your knees slightly bent, or sleep on your back with a pillow under your knees. Do not sleep on your stomach. LIFTING, TWISTING, AND REACHING  Avoid heavy lifting, especially repetitive lifting. If you must do heavy lifting:  Stretch before lifting.  Work slowly.  Rest between lifts.  Use carts and dollies to move objects when possible.  Make several small trips instead of carrying 1 heavy load.  Ask for help when you need it.  Ask for help when moving big, awkward objects.  Follow these steps when lifting:  Stand with your feet shoulder-width apart.  Get as close to the object as you can. Do not try to pick up heavy objects that are far from your body.  Use handles or lifting straps if they are available.  Bend at your knees. Squat down, but keep your heels off the floor.  Keep your shoulders pulled back, your chin tucked in, and your back straight.  Lift the object slowly, tightening the muscles in your legs, abdomen, and buttocks. Keep the object as close to the center of your body as possible.  When you put a load down, use these same guidelines in reverse.  Do not:  Lift the object above your waist.    Twist at the waist while lifting or carrying a load. Move your feet if you need to turn, not your waist.  Bend over without bending at your knees.  Avoid reaching over your head, across a table, or for an object on a high surface. OTHER TIPS  Avoid wet floors and keep sidewalks clear of ice to prevent falls.  Do not sleep on a mattress that is too soft or too hard.  Keep items that are used frequently within easy reach.  Put heavier objects on shelves at waist level and lighter objects on lower or higher shelves.  Find ways to  decrease your stress, such as exercise, massage, or relaxation techniques. Stress can build up in your muscles. Tense muscles are more vulnerable to injury.  Seek treatment for depression or anxiety if needed. These conditions can increase your risk of developing back pain. SEEK MEDICAL CARE IF:  You injure your back.  You have questions about diet, exercise, or other ways to prevent back injuries. MAKE SURE YOU:  Understand these instructions.  Will watch your condition.  Will get help right away if you are not doing well or get worse. Document Released: 01/02/2005 Document Revised: 02/17/2012 Document Reviewed: 01/06/2012 [facility] Patient Information 2015 [place], [place]. This information is not intended to replace advice given to you by your health care provider. Make sure you discuss any questions you have with your health care provider. Back Exercises Back exercises help treat and prevent back injuries. The goal of back exercises is to increase the strength of your abdominal and back muscles and the flexibility of your back. These exercises should be started when you no longer have back pain. Back exercises include:  Pelvic Tilt. Lie on your back with your knees bent. Tilt your pelvis until the lower part of your back is against the floor. Hold this position 5 to 10 sec and repeat 5 to 10 times.  Knee to Chest. Pull first 1 knee up against your chest and hold for 20 to 30 seconds, repeat this with the other knee, and then both knees. This may be done with the other leg straight or bent, whichever feels better.  Sit-Ups or Curl-Ups. Bend your knees 90 degrees. Start with tilting your pelvis, and do a partial, slow sit-up, lifting your trunk only 30 to 45 degrees off the floor. Take at least 2 to 3 seconds for each sit-up. Do not do sit-ups with your knees out straight. If partial sit-ups are difficult, simply do the above but with only tightening your abdominal muscles and holding it as  directed.  Hip-Lift. Lie on your back with your knees flexed 90 degrees. Push down with your feet and shoulders as you raise your hips a couple inches off the floor; hold for 10 seconds, repeat 5 to 10 times.  Back arches. Lie on your stomach, propping yourself up on bent elbows. Slowly press on your hands, causing an arch in your low back. Repeat 3 to 5 times. Any initial stiffness and discomfort should lessen with repetition over time.  Shoulder-Lifts. Lie face down with arms beside your body. Keep hips and torso pressed to floor as you slowly lift your head and shoulders off the floor. Do not overdo your exercises, especially in the beginning. Exercises may cause you some mild back discomfort which lasts for a few minutes; however, if the pain is more severe, or lasts for more than 15 minutes, do not continue exercises until you see your caregiver. Improvement with  exercise therapy for back problems is slow.  See your caregivers for assistance with developing a proper back exercise program. Document Released: 01/02/2005 Document Revised: 02/17/2012 Document Reviewed: 09/26/2011 [facility] Patient Information 2015 [place], [place]. This information is not intended to replace advice given to you by your health care provider. Make sure you discuss any questions you have with your health care provider.

## 2015-06-29 ENCOUNTER — Telehealth: Payer: Self-pay | Admitting: Internal Medicine

## 2015-06-29 NOTE — Telephone Encounter (Signed)
Pt has an appt with rheumatologist dr Kandice Robinsons  on 07-05-15. Pt is having back pain and would like to confirm the correct route is to rheum for back pain. Please confirm w/md and call pt back

## 2015-06-29 NOTE — Telephone Encounter (Signed)
Please advise, last refill 7/8 #60.

## 2015-06-29 NOTE — Telephone Encounter (Signed)
See message and advise

## 2015-06-29 NOTE — Telephone Encounter (Signed)
Pt needs new rx hydrocodone °

## 2015-06-29 NOTE — Telephone Encounter (Signed)
Encourage rheumatology f/u

## 2015-06-30 ENCOUNTER — Telehealth: Payer: Self-pay | Admitting: Internal Medicine

## 2015-06-30 NOTE — Telephone Encounter (Signed)
Pt call to say that Dr Raliegh Ip call him twice yesterday. 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 He said please call him after 2pm

## 2015-07-05 ENCOUNTER — Telehealth: Payer: Self-pay | Admitting: Internal Medicine

## 2015-07-05 ENCOUNTER — Encounter: Payer: Self-pay | Admitting: Internal Medicine

## 2015-07-05 ENCOUNTER — Other Ambulatory Visit: Payer: Self-pay | Admitting: Internal Medicine

## 2015-07-05 DIAGNOSIS — M545 Low back pain, unspecified: Secondary | ICD-10-CM

## 2015-07-05 DIAGNOSIS — G8929 Other chronic pain: Secondary | ICD-10-CM

## 2015-07-05 MED ORDER — TRAMADOL HCL 50 MG PO TABS: 50.0000 mg | ORAL_TABLET | Freq: Four times a day (QID) | ORAL | Status: DC | PRN

## 2015-07-05 NOTE — Telephone Encounter (Signed)
Left message on voicemail to keep appointment with Rheumatologist that is scheduled for today per Dr.K.

## 2015-07-05 NOTE — Telephone Encounter (Signed)
Pt went to rheumatologist  appt this am. They did labs as well.  Pt states he is completely out of meds and hopes you have had a change of heart and will prescribe him pain meds. Pt needs to work and the only way he can work is with meds.  Pt knows there is something seriously wrong with his back and he would like to schedule an MRI asap and then see a neurologist.  Pt has shooting pains down both legs..  If you will not give vicodin, pls consider something else. If you need to see pt, he will come in. Pt states he will probably not hear hisphone, but pls LM.  Ok for donna to c/b as well

## 2015-07-05 NOTE — Telephone Encounter (Signed)
Please see message and advise 

## 2015-07-07 ENCOUNTER — Other Ambulatory Visit: Payer: Self-pay | Admitting: Internal Medicine

## 2015-07-07 ENCOUNTER — Telehealth: Payer: Self-pay | Admitting: Internal Medicine

## 2015-07-07 ENCOUNTER — Ambulatory Visit
Admission: RE | Admit: 2015-07-07 | Discharge: 2015-07-07 | Disposition: A | Discharge status: Home / Self Care | Payer: 59 | Source: Ambulatory Visit | Attending: Internal Medicine | Admitting: Internal Medicine

## 2015-07-07 DIAGNOSIS — M5417 Radiculopathy, lumbosacral region: Secondary | ICD-10-CM

## 2015-07-07 DIAGNOSIS — G8929 Other chronic pain: Secondary | ICD-10-CM

## 2015-07-07 DIAGNOSIS — M545 Low back pain, unspecified: Secondary | ICD-10-CM

## 2015-07-07 DIAGNOSIS — M549 Dorsalgia, unspecified: Secondary | ICD-10-CM

## 2015-07-07 NOTE — Telephone Encounter (Signed)
Patient called back and would like to know if Dr. Raliegh Ip will prescribe him Vicodin until he is seen by neurosurgery? Patient would like a callback .

## 2015-07-07 NOTE — Telephone Encounter (Signed)
Left message.  Please schedule neurosurgical consultation due to chronic back pain and radiculopathy

## 2015-07-07 NOTE — Telephone Encounter (Signed)
MRI scheduled for 9:50 am today, July 29. Pt would like to know as soon as the results get back what the next step would be. Pt is hoping this could be today.  ok to LM

## 2015-07-07 NOTE — Telephone Encounter (Signed)
Spoke to pt, told him I put urgent referral in for Neurosurgeon and someone will contact you regarding an appointment. Pt verbalized understanding and asked if Dr.K said anything about the Hydrocodone. Told pt Dr.K said no to Hydrocodone. Pt verbalized understanding.

## 2015-07-10 ENCOUNTER — Telehealth: Payer: Self-pay | Admitting: Internal Medicine

## 2015-07-10 NOTE — Telephone Encounter (Signed)
Pt is still having back pain and tramadol and diazepam is not work. Please advise

## 2015-07-11 ENCOUNTER — Telehealth: Payer: Self-pay | Admitting: Internal Medicine

## 2015-07-11 NOTE — Telephone Encounter (Signed)
Per pt, he does not want to go to [facility].  Pt prefers to go to [facility]. Pt has [employer] [facility] and I will send referral to deborah.  Pt has appt w/ dr Tonita Cong on 8/8 and hopes you will give pain med to get him through to appt.  Pt states he has quit taking diazepam , he states not working either.

## 2015-07-11 NOTE — Telephone Encounter (Signed)
Noted  

## 2015-07-11 NOTE — Telephone Encounter (Signed)
Spoke to pt, told him Dr.K said he can not give him any more pain medication, take the Tramadol as directed and will have to wait till sees Neurosurgeon. Pt verbalized understanding and stated is seeing surgeons Nurse Practitioner tomorrow and needs referral done right away. Told him okay I will have our referral coordinator take care of it right away today. Pt verbalized understanding.

## 2015-07-11 NOTE — Telephone Encounter (Signed)
Spoke to pt, said can see Nurse Practitioner tomorrow and needs referral done today. Told pt okay will have our referral coordinator take care of it today. Pt verbalized understanding.   Spoke to [place] due to [employer] at lunch and told her pt has an appt tomorrow with Dr.Beane's Nurse Practitioner and needs referral done right away, also referral for Monday to see Dr. Tonita Cong. Estill Bamberg verbalized understanding and will take care of referrals right away.

## 2015-07-11 NOTE — Telephone Encounter (Signed)
Confirmation Thank you for your online Referral submission.  referral  was transmitted on 07/11/2015 at 12:54 PM CDT Your Referral Number is [id number] Faxed referral to Dr Tonita Cong office - Dr. Dondra Spry, MD  Orthopedic Surgeon  [address], [place], [place] [address]   [phone] Faxed to [phone]

## 2015-07-11 NOTE — Telephone Encounter (Signed)
Dr Tonita Cong can see pt sooner this week if refer get done sooner

## 2015-07-18 ENCOUNTER — Telehealth: Payer: Self-pay | Admitting: Internal Medicine

## 2015-07-18 DIAGNOSIS — M549 Dorsalgia, unspecified: Secondary | ICD-10-CM

## 2015-07-18 NOTE — Telephone Encounter (Signed)
Okay to do referral

## 2015-07-18 NOTE — Telephone Encounter (Signed)
Pt call to ask for a referral to see Dr Nelva Bush for a epidural for his back

## 2015-07-18 NOTE — Telephone Encounter (Signed)
ok 

## 2015-07-19 NOTE — Telephone Encounter (Signed)
Referral placed.

## 2015-07-19 NOTE — Addendum Note (Signed)
Addended by: Westley Hummer B on: 07/19/2015 10:29 AM   Modules accepted: Orders

## 2015-08-21 ENCOUNTER — Telehealth: Payer: Self-pay | Admitting: Internal Medicine

## 2015-08-21 NOTE — Telephone Encounter (Signed)
Pt would likeyou to know he will be finally getting the spinal fusipn surgery from [facility].

## 2015-08-21 NOTE — Telephone Encounter (Signed)
FYI

## 2015-09-12 ENCOUNTER — Encounter: Payer: Self-pay | Admitting: Internal Medicine

## 2015-09-12 ENCOUNTER — Ambulatory Visit (INDEPENDENT_AMBULATORY_CARE_PROVIDER_SITE_OTHER): Payer: 59 | Admitting: Internal Medicine

## 2015-09-12 VITALS — BP 164/110 | HR 90 | Temp 98.7°F | Resp 20 | Ht 70.0 in | Wt 187.0 lb

## 2015-09-12 DIAGNOSIS — R079 Chest pain, unspecified: Secondary | ICD-10-CM

## 2015-09-12 DIAGNOSIS — I10 Essential (primary) hypertension: Secondary | ICD-10-CM | POA: Diagnosis not present

## 2015-09-12 DIAGNOSIS — M545 Low back pain: Secondary | ICD-10-CM

## 2015-09-12 DIAGNOSIS — E785 Hyperlipidemia, unspecified: Secondary | ICD-10-CM | POA: Diagnosis not present

## 2015-09-12 DIAGNOSIS — G8929 Other chronic pain: Secondary | ICD-10-CM

## 2015-09-12 MED ORDER — AMLODIPINE BESYLATE 5 MG PO TABS: 5.0000 mg | ORAL_TABLET | Freq: Every day | ORAL | Status: DC

## 2015-09-12 NOTE — Patient Instructions (Signed)
Limit your sodium (Salt) intake  Please check your blood pressure on a regular basis.  If it is consistently greater than 150/90, please make an office appointment.   

## 2015-09-12 NOTE — Progress Notes (Signed)
Pre visit review using our clinic review tool, if applicable. No additional management support is needed unless otherwise documented below in the visit note. 

## 2015-09-12 NOTE — Progress Notes (Signed)
Subjective:    Patient ID: Dustin Henry, male    DOB: 08-30-1971, 44 y.o.   MRN: [id number]  HPI  44 year old patient who is seen today as a work in after referral from physical therapy.  He has a history of chronic lumbar pain and has been referred for physical therapy.  Physical therapy includes traction.  This requires Velcro restraints  placed over his upper chest wall.  He has had episodes of minor anterior chest wall pain associated with this.  Physical therapy maneuver.  This occurred 1 week ago and again today and was associated with hypertension.  The patient has been compliant with his medication but no home blood pressure monitoring. He is a former smoker but not in approximately 10 years.  No family history of cardiac disease.  His problem list includes dyslipidemia, but no lipid profile available for review. 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  Past Medical History  Diagnosis Date  . ALLERGIC RHINITIS 08/18/2007  . ANXIETY 02/09/2010  . BACK PAIN 03/09/2010  . HYPERLIPIDEMIA 02/12/2010  . HYPERTENSION 02/09/2010  . MIGRAINE HEADACHE 08/18/2007  . IBS (irritable bowel syndrome)     Social History   Social History  . Marital Status: Married    Spouse Name: N/A  . Number of Children: 1  . Years of Education: N/A   Occupational History  . pressman helper     Engineer, structural   Social History Main Topics  . Smoking status: Former Smoker -- 3.00 packs/day for 10 years    Types: Cigarettes    Quit date: 12/09/2004  . Smokeless tobacco: Never Used  . Alcohol Use: Yes  . Drug Use: No  . Sexual Activity: Not on file   Other Topics Concern  . Not on file   Social History Narrative    Past Surgical History  Procedure Laterality Date  . Kidney stones    . Finger surgery      No family history on  file.  Allergies  Allergen Reactions  . Doxycycline     REACTION: nausea  . Penicillins     REACTION: unspecified    Current Outpatient Prescriptions on File Prior to Visit  Medication Sig Dispense Refill  . HYDROcodone-acetaminophen (NORCO/VICODIN) 5-325 MG per tablet TAKE 1 TABLET EVERY 6 HOURS AS NEEDED FOR PAIN 60 tablet 0  . ibuprofen (ADVIL,MOTRIN) 200 MG tablet Take 800 mg by mouth every 6 (six) hours as needed for pain.    Marland Kitchen lisinopril-hydrochlorothiazide (PRINZIDE,ZESTORETIC) 20-25 MG per tablet TAKE 1 TABLET BY MOUTH EVERY DAY 90 tablet 1  . meloxicam (MOBIC) 15 MG tablet TAKE 1 TABLET BY MOUTH ONCE DAILY 90 tablet 1  . PARoxetine (PAXIL) 20 MG tablet TAKE 1 TABLET BY MOUTH EVERY MORNING *PT NEEDS OFFICE VISIT* 90 tablet 1  . promethazine (PHENERGAN) 25 MG tablet TAKE 1 TABLET BY MOUTH EVERY 8 HOURS AS NEEDED 30 tablet 2  . SUMAtriptan (IMITREX) 100 MG tablet Take 1 tablet (100 mg total) by mouth every 2 (two) hours as needed for migraine or headache. May repeat in 2 hours if headache persists or recurs. 3 tablet 2  . traMADol (ULTRAM) 50 MG tablet Take 1 tablet (50 mg total) by mouth every 6 (six) hours as needed. (Patient not taking: Reported on 09/12/2015) 90 tablet 0  No current facility-administered medications on file prior to visit.    BP 164/110 mmHg  Pulse 90  Temp(Src) 98.7 F (37.1 C) (Oral)  Resp 20  Ht 5\' 10"  (1.778 m)  Wt 187 lb (84.823 kg)  BMI 26.83 kg/m2  SpO2 98%     Review of Systems  Constitutional: Negative for fever, chills, appetite change and fatigue.  HENT: Negative for congestion, dental problem, ear pain, hearing loss, sore throat, tinnitus, trouble swallowing and voice change.   Eyes: Negative for pain, discharge and visual disturbance.  Respiratory: Negative for cough, chest tightness, wheezing and stridor.   Cardiovascular: Positive for chest pain. Negative for palpitations and leg swelling.  Gastrointestinal: Negative for nausea,  vomiting, abdominal pain, diarrhea, constipation, blood in stool and abdominal distention.  Genitourinary: Negative for urgency, hematuria, flank pain, discharge, difficulty urinating and genital sores.  Musculoskeletal: Negative for myalgias, back pain, joint swelling, arthralgias, gait problem and neck stiffness.  Skin: Negative for rash.  Neurological: Negative for dizziness, syncope, speech difficulty, weakness, numbness and headaches.  Hematological: Negative for adenopathy. Does not bruise/bleed easily.  Psychiatric/Behavioral: Negative for behavioral problems and dysphoric mood. The patient is not nervous/anxious.        Objective:   Physical Exam  Constitutional: He is oriented to person, place, and time. He appears well-developed.  Lowest blood pressure 150/90  HENT:  Head: Normocephalic.  Right Ear: External ear normal.  Left Ear: External ear normal.  Eyes: Conjunctivae and EOM are normal.  Neck: Normal range of motion.  Cardiovascular: Normal rate and normal heart sounds.   Pulmonary/Chest: Breath sounds normal. No respiratory distress. He has no wheezes. He has no rales. He exhibits no tenderness.  Abdominal: Bowel sounds are normal.  Musculoskeletal: Normal range of motion. He exhibits no edema or tenderness.  Neurological: He is alert and oriented to person, place, and time.  Psychiatric: He has a normal mood and affect. His behavior is normal.          Assessment & Plan:   Chest wall pain associated with traction Hypertension.  Labile.  Low-salt diet.  Home blood pressure monitor and encourage.  Will add amlodipine 5.  Recheck 4 weeks History dyslipidemia.  No documentation.  Will review a lipid profile  EKG revealed some minor nonspecific ST changes

## 2015-09-21 ENCOUNTER — Telehealth: Payer: Self-pay | Admitting: Internal Medicine

## 2015-09-21 NOTE — Telephone Encounter (Signed)
Patient Name: Dustin Henry  DOB: 02/28/1971    Initial Comment caller states he has been having chest pain on and off (none this morning) - this am his bp is 152/115   Nurse Assessment  Nurse: Mallie Mussel, RN, Alveta Heimlich Date/Time (Eastern Time): 09/21/2015 9:01:44 AM  Confirm and document reason for call. If symptomatic, describe symptoms. ---Caller states that he has had chest pain on and off. Denies chest pain at present. This has been going on for about a week. The chest pain is happening more frequently. He has already spoken with his doctor about this. His BP was 152/115 about an hour ago. His reading a few minutes ago was 182/98. Denies headache at present. He denies unilateral weakness and numbness. He has chronic back pain. He rates his pain as mild at present.  Has the patient traveled out of the country within the last 30 days? ---No  Does the patient have any new or worsening symptoms? ---Yes  Will a triage be completed? ---Yes  Related visit to physician within the last 2 weeks? ---No  Does the PT have any chronic conditions? (i.e. diabetes, asthma, etc.) ---Yes  List chronic conditions. ---HTN, Chronic Back Pain     Guidelines    Guideline Title Affirmed Question Affirmed Notes  High Blood Pressure [1] BP ? 140/90 AND [2] taking BP medications    Final Disposition User   See PCP within [place], RN, Alveta Heimlich    Comments  Caller does not have any symptoms that would require being seen immediately. States he was told by his doctor to call if he began to have BP problems. He is wanting to speak with his doctor or Butch Penny about this to see if he wants him to change medication or the dosage. Advised him i will forward this request to the office.   Disagree/Comply: Disagree  Disagree/Comply Reason: Disagree with instructions

## 2015-09-21 NOTE — Telephone Encounter (Signed)
Called and spoke with pt, per Dr. Yong Channel he does not feel comfortable with changing pt BP medicine dosage without consulting with Dr. Raliegh Ip first. Since Dr. Raliegh Ip is out of the office i adivsed pt to go the ED if he has any chest pain or worsening symptoms and I would have Dr. Raliegh Ip review this first thing in the morning to advise pt on what changes need to be made regarding medications. Please call pt back in the morning.

## 2015-09-21 NOTE — Telephone Encounter (Signed)
Pls advise.  

## 2015-09-22 NOTE — Telephone Encounter (Signed)
Increase amlodipine to 10 mg daily Follow-up office visit next week

## 2015-09-22 NOTE — Telephone Encounter (Signed)
I called the pt and informed him of the message below and scheduled a follow up visit for 10/19.

## 2015-09-27 ENCOUNTER — Encounter: Payer: Self-pay | Admitting: Internal Medicine

## 2015-09-27 ENCOUNTER — Ambulatory Visit (INDEPENDENT_AMBULATORY_CARE_PROVIDER_SITE_OTHER): Payer: 59 | Admitting: Internal Medicine

## 2015-09-27 VITALS — BP 142/90 | HR 88 | Temp 98.7°F | Resp 20 | Ht 70.0 in | Wt 191.0 lb

## 2015-09-27 DIAGNOSIS — G8929 Other chronic pain: Secondary | ICD-10-CM | POA: Diagnosis not present

## 2015-09-27 DIAGNOSIS — M545 Low back pain: Secondary | ICD-10-CM

## 2015-09-27 DIAGNOSIS — I10 Essential (primary) hypertension: Secondary | ICD-10-CM | POA: Diagnosis not present

## 2015-09-27 NOTE — Progress Notes (Signed)
Pre visit review using our clinic review tool, if applicable. No additional management support is needed unless otherwise documented below in the visit note. 

## 2015-09-27 NOTE — Progress Notes (Signed)
Subjective:    Patient ID: Dustin Henry, male    DOB: 1971/01/07, 44 y.o.   MRN: [id number]  HPI  44 year old patient who is seen today for follow-up of essential hypertension He continues to have intermittent low back pain and has been referred for subspecialty consultation.  He has had a lumbar MRI.  Today he has no back pain Complaints today include left anterior chest pain of one week's duration.  Pain waxes and wanes but does not appear to be related to activity.  No exertional chest pain He also complains of tinnitus of one-week duration.  For the past few weeks.  He complains of some tingling involving his left arm.  For the past week he has had numbness and tingling involving both feet He states and neurosurgery has done a recent nerve conduction study.  Past Medical History  Diagnosis Date  . ALLERGIC RHINITIS 08/18/2007  . ANXIETY 02/09/2010  . BACK PAIN 03/09/2010  . HYPERLIPIDEMIA 02/12/2010  . HYPERTENSION 02/09/2010  . MIGRAINE HEADACHE 08/18/2007  . IBS (irritable bowel syndrome)     Social History   Social History  . Marital Status: Married    Spouse Name: N/A  . Number of Children: 1  . Years of Education: N/A   Occupational History  . pressman helper     Engineer, structural   Social History Main Topics  . Smoking status: Former Smoker -- 3.00 packs/day for 10 years    Types: Cigarettes    Quit date: 12/09/2004  . Smokeless tobacco: Never Used  . Alcohol Use: Yes  . Drug Use: No  . Sexual Activity: Not on file   Other Topics Concern  . Not on file   Social History Narrative    Past Surgical History  Procedure Laterality Date  . Kidney stones    . Finger surgery      No family history on file.  Allergies  Allergen Reactions  . Doxycycline     REACTION: nausea  . Penicillins     REACTION: unspecified    Current Outpatient Prescriptions on File Prior to Visit  Medication Sig Dispense Refill  . amLODipine (NORVASC) 5 MG tablet Take 1 tablet (5 mg  total) by mouth daily. 90 tablet 3  . gabapentin (NEURONTIN) 300 MG capsule TK 2 CS PO TID  0  . HYDROcodone-acetaminophen (NORCO/VICODIN) 5-325 MG per tablet TAKE 1 TABLET EVERY 6 HOURS AS NEEDED FOR PAIN 60 tablet 0  . lisinopril-hydrochlorothiazide (PRINZIDE,ZESTORETIC) 20-25 MG per tablet TAKE 1 TABLET BY MOUTH EVERY DAY 90 tablet 1  . meloxicam (MOBIC) 15 MG tablet TAKE 1 TABLET BY MOUTH ONCE DAILY 90 tablet 1  . PARoxetine (PAXIL) 20 MG tablet TAKE 1 TABLET BY MOUTH EVERY MORNING *PT NEEDS OFFICE VISIT* 90 tablet 1  . promethazine (PHENERGAN) 25 MG tablet TAKE 1 TABLET BY MOUTH EVERY 8 HOURS AS NEEDED 30 tablet 2  . SUMAtriptan (IMITREX) 100 MG tablet Take 1 tablet (100 mg total) by mouth every 2 (two) hours as needed for migraine or headache. May repeat in 2 hours if headache persists or recurs. 3 tablet 2   No current facility-administered medications on file prior to visit.    BP 142/90 mmHg  Pulse 88  Temp(Src) 98.7 F (37.1 C) (Oral)  Resp 20  Ht 5\' 10"  (1.778 m)  Wt 191 lb (86.637 kg)  BMI 27.41 kg/m2  SpO2 97%     Review of Systems  Constitutional: Negative for fever, chills, appetite change  and fatigue.  HENT: Positive for tinnitus. Negative for congestion, dental problem, ear pain, hearing loss, sore throat, trouble swallowing and voice change.   Eyes: Negative for pain, discharge and visual disturbance.  Respiratory: Negative for cough, chest tightness, wheezing and stridor.   Cardiovascular: Positive for chest pain. Negative for palpitations and leg swelling.  Gastrointestinal: Negative for nausea, vomiting, abdominal pain, diarrhea, constipation, blood in stool and abdominal distention.  Genitourinary: Negative for urgency, hematuria, flank pain, discharge, difficulty urinating and genital sores.  Musculoskeletal: Positive for back pain. Negative for myalgias, joint swelling, arthralgias, gait problem and neck stiffness.  Skin: Negative for rash.  Neurological:  Positive for numbness. Negative for dizziness, syncope, speech difficulty, weakness and headaches.  Hematological: Negative for adenopathy. Does not bruise/bleed easily.  Psychiatric/Behavioral: Negative for behavioral problems and dysphoric mood. The patient is not nervous/anxious.        Objective:   Physical Exam  Constitutional: He is oriented to person, place, and time. He appears well-developed.  Blood pressure 449-675/91-63 diastolic  HENT:  Head: Normocephalic.  Right Ear: External ear normal.  Left Ear: External ear normal.  Eyes: Conjunctivae and EOM are normal.  Neck: Normal range of motion.  Cardiovascular: Normal rate and normal heart sounds.   Pulmonary/Chest: Effort normal and breath sounds normal. He exhibits tenderness.  Slight tenderness to palpation over the left anterior chest wall area.  Pain also reproduced by deep inspiration  Abdominal: Bowel sounds are normal.  Musculoskeletal: Normal range of motion. He exhibits no edema or tenderness.  Neurological: He is alert and oriented to person, place, and time.  Reflexes upper extremities, slightly suppressed but symmetrical Patellar and Achilles reflexes brisk and equal  No motor weakness  Psychiatric: He has a normal mood and affect. His behavior is normal.          Assessment & Plan:   Chronic low back pain.  Multilevel degenerative disc and osteoarthritic changes Hypertension.  Reasonable control.  Patient states he often has normal blood pressure readings at home.  We'll continue present triple therapy Paresthesias with intermittent left arm tingling and bilateral burning of the feet.  Will await the results of the nerve conduction studies

## 2015-09-27 NOTE — Patient Instructions (Signed)
Limit your sodium (Salt) intake  Please check your blood pressure on a regular basis.  If it is consistently greater than 150/90, please make an office appointment.  Return in 3 months for follow-up  

## 2015-10-09 ENCOUNTER — Other Ambulatory Visit: Payer: Self-pay | Admitting: Internal Medicine

## 2015-10-16 ENCOUNTER — Ambulatory Visit: Payer: Self-pay | Admitting: Physician Assistant

## 2015-10-30 ENCOUNTER — Other Ambulatory Visit: Payer: Self-pay

## 2015-10-30 ENCOUNTER — Telehealth: Payer: Self-pay | Admitting: Internal Medicine

## 2015-10-30 ENCOUNTER — Encounter (HOSPITAL_COMMUNITY)
Admission: RE | Admit: 2015-10-30 | Discharge: 2015-10-30 | Disposition: A | Discharge status: Home / Self Care | Payer: 59 | Source: Ambulatory Visit | Attending: Orthopedic Surgery | Admitting: Orthopedic Surgery

## 2015-10-30 ENCOUNTER — Encounter (HOSPITAL_COMMUNITY): Payer: Self-pay | Admitting: Vascular Surgery

## 2015-10-30 ENCOUNTER — Encounter (HOSPITAL_COMMUNITY): Payer: Self-pay

## 2015-10-30 DIAGNOSIS — Z0183 Encounter for blood typing: Secondary | ICD-10-CM | POA: Diagnosis not present

## 2015-10-30 DIAGNOSIS — Z87891 Personal history of nicotine dependence: Secondary | ICD-10-CM | POA: Diagnosis not present

## 2015-10-30 DIAGNOSIS — M79605 Pain in left leg: Secondary | ICD-10-CM | POA: Diagnosis not present

## 2015-10-30 DIAGNOSIS — R202 Paresthesia of skin: Secondary | ICD-10-CM | POA: Insufficient documentation

## 2015-10-30 DIAGNOSIS — Z01818 Encounter for other preprocedural examination: Secondary | ICD-10-CM | POA: Insufficient documentation

## 2015-10-30 DIAGNOSIS — Z79899 Other long term (current) drug therapy: Secondary | ICD-10-CM | POA: Insufficient documentation

## 2015-10-30 DIAGNOSIS — E785 Hyperlipidemia, unspecified: Secondary | ICD-10-CM | POA: Insufficient documentation

## 2015-10-30 DIAGNOSIS — M5126 Other intervertebral disc displacement, lumbar region: Secondary | ICD-10-CM | POA: Diagnosis not present

## 2015-10-30 DIAGNOSIS — F329 Major depressive disorder, single episode, unspecified: Secondary | ICD-10-CM | POA: Insufficient documentation

## 2015-10-30 DIAGNOSIS — Z01812 Encounter for preprocedural laboratory examination: Secondary | ICD-10-CM | POA: Diagnosis not present

## 2015-10-30 DIAGNOSIS — I10 Essential (primary) hypertension: Secondary | ICD-10-CM | POA: Diagnosis not present

## 2015-10-30 HISTORY — DX: Nocturia: R35.1

## 2015-10-30 HISTORY — DX: Failed or difficult intubation, initial encounter: T88.4XXA

## 2015-10-30 HISTORY — DX: Depression, unspecified: F32.A

## 2015-10-30 HISTORY — DX: Major depressive disorder, single episode, unspecified: F32.9

## 2015-10-30 HISTORY — DX: Personal history of urinary calculi: Z87.442

## 2015-10-30 LAB — TYPE AND SCREEN
ABO/RH(D): A POS
Antibody Screen: NEGATIVE

## 2015-10-30 LAB — CBC
HCT: 44.2 % (ref 39.0–52.0)
HEMOGLOBIN: 15.6 g/dL (ref 13.0–17.0)
MCH: 31 pg (ref 26.0–34.0)
MCHC: 35.3 g/dL (ref 30.0–36.0)
MCV: 87.9 fL (ref 78.0–100.0)
Platelets: 360 10*3/uL (ref 150–400)
RBC: 5.03 MIL/uL (ref 4.22–5.81)
RDW: 12.3 % (ref 11.5–15.5)
WBC: 7.6 10*3/uL (ref 4.0–10.5)

## 2015-10-30 LAB — BASIC METABOLIC PANEL
ANION GAP: 14 (ref 5–15)
BUN: 19 mg/dL (ref 6–20)
CALCIUM: 10.1 mg/dL (ref 8.9–10.3)
CO2: 21 mmol/L — AB (ref 22–32)
Chloride: 100 mmol/L — ABNORMAL LOW (ref 101–111)
Creatinine, Ser: 1.17 mg/dL (ref 0.61–1.24)
GFR calc Af Amer: >60 mL/min (ref >60)
GFR calc non Af Amer: >60 mL/min (ref >60)
GLUCOSE: 116 mg/dL — AB (ref 65–99)
Potassium: 3.5 mmol/L (ref 3.5–5.1)
Sodium: 135 mmol/L (ref 135–145)

## 2015-10-30 LAB — SURGICAL PCR SCREEN
MRSA, PCR: NEGATIVE
STAPHYLOCOCCUS AUREUS: NEGATIVE

## 2015-10-30 LAB — ABO/RH: ABO/RH(D): A POS

## 2015-10-30 NOTE — Telephone Encounter (Signed)
I would suggest pt be seen for new symptom. Please call pt and schedule him with a provider this week. Cory or Dr. Elease Hashimoto.

## 2015-10-30 NOTE — Telephone Encounter (Signed)
Dustin Henry [phone] He has a lumbar fusion on November 30th but he's came up with a new symptom as far as facial tingling on his left side, that's been going on for about a couple of weeks. Just wanted to touch base and see if he needed to be seen before the surgery.

## 2015-10-30 NOTE — Progress Notes (Signed)
   10/30/15 1414  OBSTRUCTIVE SLEEP APNEA  Have you ever been diagnosed with sleep apnea through a sleep study? No  Do you snore loudly (loud enough to be heard through closed doors)?  1  Do you often feel tired, fatigued, or sleepy during the daytime (such as falling asleep during driving or talking to someone)? 0  Has anyone observed you stop breathing during your sleep? 0  Do you have, or are you being treated for high blood pressure? 1  BMI more than 35 kg/m2? 0  Age > 50 (1-yes) 0  Neck circumference greater than:Male 16 inches or larger, Male 17inches or larger? 1 (43)  Male Gender (Yes=1) 1  Obstructive Sleep Apnea Score 4  Score 5 or greater  Results sent to PCP

## 2015-10-30 NOTE — Progress Notes (Addendum)
Anesthesia PAT Evaluation: Patient is a 44 year old male scheduled for L5-S1 LIF on 11/08/15 by Dr. Rolena Infante.  History includes former smoker, DIFFICULT AIRWAY, HLD, IBS, HTN, migraine, depression, nephrolithiasis. Saw his PCP Dr. Bluford Kaufmann 09/27/15 for HTN follow-up and reported left anterior non-exertional chest pain and N/T left arm and bilateral feet. EKG showed non-specific findings then. Nerve conduction studies were pending. By notes, it appears Dr. Burnice Logan felt symptoms were likely related to pain associated with traction used in PT. Today patient also reported tingling in the left side of his face (along ear line and down his neck), so I was asked to evaluate patient.   Patient reports chest pain and left arm tingling has resolved. These had been associated with traction in PT. Has had intermittent left facial tingling for ~ 3 weeks. May get a few times a day and lasts for about 1 minute. No progression on symptoms. No associated chest pain, jaw pain or jaw numbness, nausea, SOB, BUE pain or N/T, visual changes, weakness, dysphagia, dysarthria. Symptoms are not associated with activity. Denied history of Bells palsy/trigimineal neuralgia. No tooth pain. No known personal or family history of CVA or CAD. Apparently, he was recently started on anti-hypertensive medication because of uncontrolled HTN at PT (reported DBP of 115) and at the time of an epidural injection by Dr. Nelva Bush (reported SBP 211). He was able to use a self propelled lawnmover within the past two months that did not reproduce chest, arm, or jaw, or facial symptoms. It took him twice as long, however, because he had to rest due to back and BLE pain.   ANESTHESIA HISTORY: He said he had an anesthesia complication in [id number] at [facility] but can't remember the details. Anesthesia records from 09/05/10 and 09/20/10 obtained. On 09/05/10 he underwent cystoscopy, lithotripsy under GA with ETT. MAC 4 was used to place ETT X 1 attempt but  notes indicate airway was difficult (anterior cords visible with head up/cricoid pressure). GA with LMA used 09/20/10 for cystoscopy, bilateral stent with lithotripsy. No documentation of anesthesia complication.  Meds include amlodipine, gabapentin, Norco, lisinopril-HCTZ, Paxil, promethazine, Imitrex.  09/12/15 EKG: SR, prominent R (V1), non-specific, non-specific ST depression, non-diagnostic. EKG repeated today 10/30/15 and showed NSR, T wave abnormality, consider inferior ischemia, possible LAE. Inferior T wave abnormality is more prominent when compared to his 09/12/15 tracing.   PAT Vitals: BP 142/85, HR 93, RR 20, O2 sat 95%, T 36.1C. On exam, he is a Caucasian male in NAD. BMI 28. Heart RRR, no murmur noted. Lungs clear. No pre-tibial edema noted. No carotid bruits auscultated. Facial symmetry present. Mallampati 3.   Preoperative labs noted.    Intermittent left facial tingling, on unknown etiology. No exertional symptoms or other neurologic symptoms. His BP is better controlled on medication now. Previous chest "wall" pain and LUE tingling has resolved. Discussed only able to perform limited testing in PAT, but advised to seek immediate evaluation if worsening/progressive symptoms or other associated symptoms such as chest pain, jaw/arm pain, new weakness, visual or speech changes, etc. Discussed with anesthesiologist Dr. Oletta Lamas. She advised to update Dr. Burnice Logan of symptoms, labs and EKG findings to determine if he was okay clearing patient for surgery or if he felt patient would need to be seen or undergo additional testing prior to surgery. Dr. Burnice Logan is out of the office until 11/06/15, but his nurse Butch Penny will call me to discuss. Patient aware.   Myra Gianotti, PA-C [facility] Short Stay Center/Anesthesiology Phone [phone])  [id number] 10/30/2015 5:27 PM  Addendum: Patient seen today by Dorothyann Peng, NP for left facial tingling and to review pre-operative labs/EKG. EKG repeated and  showed persistent inferior T wave abnormality, but now also with T wave inversion in V3-6. Cory discussed EKG findings with cardiologist Dr. Allegra Lai who advised that he should have a stress test done before surgery. There is consideration of neurology referred for his left facial tingling.   Surgical clearance is on hold for now. It appears he is seeing Dr. Stanford Breed on 11/06/14 (unclear if this is an office appointment or for his stress test.). I left a voice message with Judeen Hammans at Dr. Rolena Infante' office giving her an update.   George [facility] Short Stay Center/Anesthesiology Phone [phone] 11/01/2015 4:10 PM

## 2015-10-30 NOTE — Progress Notes (Addendum)
Cardiologist denies having one  Medical Md is Dr.Peter Danie Chandler test/heart cath denies ever having one   EKG in epic from 09-12-15  CXR denies having one in past yr

## 2015-11-01 ENCOUNTER — Ambulatory Visit (INDEPENDENT_AMBULATORY_CARE_PROVIDER_SITE_OTHER): Payer: 59 | Admitting: Adult Health

## 2015-11-01 ENCOUNTER — Encounter: Payer: Self-pay | Admitting: Adult Health

## 2015-11-01 DIAGNOSIS — R6889 Other general symptoms and signs: Secondary | ICD-10-CM

## 2015-11-01 DIAGNOSIS — R9431 Abnormal electrocardiogram [ECG] [EKG]: Secondary | ICD-10-CM | POA: Diagnosis not present

## 2015-11-01 DIAGNOSIS — R448 Other symptoms and signs involving general sensations and perceptions: Secondary | ICD-10-CM

## 2015-11-01 DIAGNOSIS — I10 Essential (primary) hypertension: Secondary | ICD-10-CM | POA: Diagnosis not present

## 2015-11-01 NOTE — Progress Notes (Signed)
Pre visit review using our clinic review tool, if applicable. No additional management support is needed unless otherwise documented below in the visit note. 

## 2015-11-01 NOTE — Patient Instructions (Signed)
Follow up with your stress test.   I am also going to put a consult in for cardiology so that you can be evaluated by them.   I am sorry, but I cannot clear you for surgery at this point.

## 2015-11-01 NOTE — Progress Notes (Addendum)
Subjective:    Patient ID: Dustin Henry, male    DOB: 03/08/1971, 44 y.o.   MRN: [id number]  HPI  44 year old male who presents to the office today for a abnormal sensation on the left side of his face that radiates along ear line and down his neck into left breast. The feeling is that of "oozing". He may get this feeling a few times a day and it never lasts longer than a minute.  He denies any sharp pain, headaches, blurred vision, problems with chewing or swallowing. No exertional symptoms. This as been an ongoing issue for about three weeks. He was seen 2 days ago for Pre Op for spinal fusion he is having middle of next week. EKG done during that visit showed showed NSR, T wave abnormality, consider inferior ischemia, possible LAE. Inferior T wave abnormality is more prominent when compared to his 09/12/15 tracing.   09/12/15 EKG: SR, prominent R (V1), non-specific, non-specific ST depression, non-diagnostic. EKG repeated today   11/01/2015 - EKG repeated today which showed Non Specific ST depression + T wave abnormality, appears with T- wave inversion.   Spoke to Cardiologist Dustin Henry who advised that patient have a stress test done before surgery. Patient did not need to be seen today due to being asymptomatic. Patient reports that he would probably be unable to walk on a treadmill for very long.   Recent increase in Norvasc from 5 mg to 10 mg due to uncontrolled hypertension.      Review of Systems  Constitutional: Negative.   HENT: Negative.   Eyes: Negative.   Respiratory: Negative.   Cardiovascular: Negative.   Gastrointestinal: Negative.   Endocrine: Negative.   Genitourinary: Negative.   Musculoskeletal: Negative.   Neurological: Positive for numbness.  All other systems reviewed and are negative.  Past Medical History  Diagnosis Date  . ALLERGIC RHINITIS 08/18/2007  . BACK PAIN     herniated disc  . HYPERLIPIDEMIA 02/12/2010  . IBS (irritable bowel syndrome)     . HYPERTENSION     takes Amlodipine and Lisinopril-HCTZ daily  . MIGRAINE HEADACHE     takes Imitrex daily as needed  . Depression     takes Paxil daily  . Tingling     in left jaw and down to upper chest-states started 3 wks ago."Feels like something is oozing"  . Shortness of breath dyspnea     laying down   . Weakness     numbness and tingling in both legs  . History of kidney stones   . Nocturia   . Complication of anesthesia     but doesn't have a clue as to what; 09/05/10 anes records: "difficult airway"  . Difficult intubation     09/05/10: difficult airway documented (ant cords vis with head up/cricoid press), MAC 4 used    Social History   Social History  . Marital Status: Married    Spouse Name: N/A  . Number of Children: 1  . Years of Education: N/A   Occupational History  . pressman helper     Engineer, structural   Social History Main Topics  . Smoking status: Former Smoker -- 3.00 packs/day for 10 years    Types: Cigarettes  . Smokeless tobacco: Never Used     Comment: quit smoking about 54yrs ago  . Alcohol Use: Yes     Comment: occasionally  . Drug Use: No  . Sexual Activity: Not on file   Other Topics  Concern  . Not on file   Social History Narrative    Past Surgical History  Procedure Laterality Date  . Finger surgery    . Cystoscopy      No family history on file.  Allergies  Allergen Reactions  . Doxycycline     REACTION: nausea  . Penicillins     REACTION: unspecified    Current Outpatient Prescriptions on File Prior to Visit  Medication Sig Dispense Refill  . amLODipine (NORVASC) 5 MG tablet Take 1 tablet (5 mg total) by mouth daily. (Patient taking differently: Take 10 mg by mouth daily. ) 90 tablet 3  . gabapentin (NEURONTIN) 300 MG capsule 600mg  threes times a day  0  . HYDROcodone-acetaminophen (NORCO/VICODIN) 5-325 MG per tablet TAKE 1 TABLET EVERY 6 HOURS AS NEEDED FOR PAIN 60 tablet 0  . lisinopril-hydrochlorothiazide  (PRINZIDE,ZESTORETIC) 20-25 MG per tablet TAKE 1 TABLET BY MOUTH EVERY DAY 90 tablet 1  . meloxicam (MOBIC) 15 MG tablet TAKE 1 TABLET BY MOUTH ONCE DAILY 90 tablet 1  . PARoxetine (PAXIL) 20 MG tablet TAKE ONE TABLET BY MOUTH EVERY MORNING 30 tablet 2  . promethazine (PHENERGAN) 25 MG tablet TAKE 1 TABLET BY MOUTH EVERY 8 HOURS AS NEEDED 30 tablet 2  . SUMAtriptan (IMITREX) 100 MG tablet Take 1 tablet (100 mg total) by mouth every 2 (two) hours as needed for migraine or headache. May repeat in 2 hours if headache persists or recurs. 3 tablet 2   No current facility-administered medications on file prior to visit.    BP 128/82 mmHg  Temp(Src) 98.2 F (36.8 C) (Oral)  Ht 5\' 10"  (1.778 m)  Wt 195 lb 11.2 oz (88.769 kg)  BMI 28.08 kg/m2       Objective:   Physical Exam  Constitutional: He is oriented to person, place, and time. He appears well-developed and well-nourished. No distress.  Eyes: Right eye exhibits no discharge. Left eye exhibits no discharge.  Cardiovascular: Normal rate, regular rhythm, normal heart sounds and intact distal pulses.  Exam reveals no gallop and no friction rub.   No murmur heard. Pulmonary/Chest: Effort normal and breath sounds normal. No respiratory distress. He has no wheezes. He has no rales. He exhibits no tenderness.  Neurological: He is alert and oriented to person, place, and time.  Skin: Skin is warm and dry. No rash noted. He is not diaphoretic. No erythema. No pallor.  Psychiatric: He has a normal mood and affect. His behavior is normal. Judgment and thought content normal.  Nursing note and vitals reviewed.     Assessment & Plan:  1. Abnormal finding on EKG - Unable to clear patient for surgery at this time. Dustin Henry is upset and rightfully so that he cannot go through with surgery at this time.  - EKG 12-Lead- Sinus Tachycardia, non specific ST depression = T wave abormality, Rate 106 - Myocardial Perfusion Imaging; Future - Appointment on  11/15/2015 - Ambulatory referral to Cardiology - Reviewed with Dr. Carolann Henry and he agrees that patient should not be cleared for surgery.  - Go to the ER with CP, SOB, difficulty breathing.  2. Sensation of pressure in face Intermittent left facial tingling, on unknown etiology. No exertional symptoms or other neurologic or cardiac symptoms. Unsure of cause at this point.  - Consider neuro referral  - EKG 12-Lead

## 2015-11-01 NOTE — Progress Notes (Signed)
HPI: 44 year old male for preop evaluation prior to spinal fusion and abnormal electrocardiogram. Patient recently scheduled to have back surgery with Dr. Rolena Infante. Preoperative electrocardiogram noted to be abnormal. Cardiology asked to evaluate. He has limited mobility because of back pain. He has some dyspnea on exertion which she attributes to deconditioning. No orthopnea, PND, pedal edema. He has not had exertional chest pain. He describes a discomfort in his left facial area. It is characterized as "oozing". Not exertional. No associated symptoms. Last 30 seconds and resolves. Radiates to chest.  Current Outpatient Prescriptions  Medication Sig Dispense Refill  . amLODipine (NORVASC) 10 MG tablet     . gabapentin (NEURONTIN) 300 MG capsule 600mg  threes times a day  0  . HYDROcodone-acetaminophen (NORCO/VICODIN) 5-325 MG per tablet TAKE 1 TABLET EVERY 6 HOURS AS NEEDED FOR PAIN 60 tablet 0  . lisinopril-hydrochlorothiazide (PRINZIDE,ZESTORETIC) 20-25 MG per tablet TAKE 1 TABLET BY MOUTH EVERY DAY 90 tablet 1  . meloxicam (MOBIC) 15 MG tablet TAKE 1 TABLET BY MOUTH ONCE DAILY 90 tablet 1  . PARoxetine (PAXIL) 20 MG tablet TAKE ONE TABLET BY MOUTH EVERY MORNING 30 tablet 2  . promethazine (PHENERGAN) 25 MG tablet TAKE 1 TABLET BY MOUTH EVERY 8 HOURS AS NEEDED 30 tablet 2  . SUMAtriptan (IMITREX) 100 MG tablet Take 1 tablet (100 mg total) by mouth every 2 (two) hours as needed for migraine or headache. May repeat in 2 hours if headache persists or recurs. 3 tablet 2   No current facility-administered medications for this visit.    Allergies  Allergen Reactions  . Doxycycline     REACTION: nausea  . Penicillins     REACTION: unspecified     Past Medical History  Diagnosis Date  . ALLERGIC RHINITIS 08/18/2007  . BACK PAIN     herniated disc  . HYPERLIPIDEMIA 02/12/2010  . IBS (irritable bowel syndrome)   . HYPERTENSION     takes Amlodipine and Lisinopril-HCTZ daily  . MIGRAINE  HEADACHE     takes Imitrex daily as needed  . Depression     takes Paxil daily  . History of kidney stones   . Nocturia   . Difficult intubation     09/05/10: difficult airway documented (ant cords vis with head up/cricoid press), MAC 4 used    Past Surgical History  Procedure Laterality Date  . Finger surgery    . Cystoscopy      Social History   Social History  . Marital Status: Married    Spouse Name: N/A  . Number of Children: 1  . Years of Education: N/A   Occupational History  . pressman helper     Engineer, structural   Social History Main Topics  . Smoking status: Former Smoker -- 3.00 packs/day for 10 years    Types: Cigarettes  . Smokeless tobacco: Never Used     Comment: quit smoking about 57yrs ago  . Alcohol Use: Yes     Comment: occasionally  . Drug Use: No  . Sexual Activity: Not on file   Other Topics Concern  . Not on file   Social History Narrative    Family History  Problem Relation Age of Onset  . Heart disease      No family history    ROS: Back pain but no fevers or chills, productive cough, hemoptysis, dysphasia, odynophagia, melena, hematochezia, dysuria, hematuria, rash, seizure activity, orthopnea, PND, pedal edema, claudication. Remaining systems are negative.  Physical Exam:  Blood pressure 160/90, pulse 93, height 5\' 10"  (1.778 m), weight 195 lb 9.6 oz (88.724 kg), SpO2 97 %.  General:  Well developed/well nourished in NAD Skin warm/dry Patient not depressed but anxious No peripheral clubbing Back-normal HEENT-normal/normal eyelids Neck supple/normal carotid upstroke bilaterally; no bruits; no JVD; no thyromegaly chest - CTA/ normal expansion CV - RRR/normal S1 and S2; no murmurs, rubs or gallops;  PMI nondisplaced Abdomen -NT/ND, no HSM, no mass, + bowel sounds, no bruit 2+ femoral pulses, no bruits Ext-no edema, chords, 2+ DP Neuro-grossly nonfocal  ECG 11/01/15-sinus tachycardia, inferior lateral TWI

## 2015-11-06 ENCOUNTER — Telehealth: Payer: Self-pay | Admitting: Internal Medicine

## 2015-11-06 ENCOUNTER — Telehealth: Payer: Self-pay | Admitting: Cardiology

## 2015-11-06 MED ORDER — AMLODIPINE BESYLATE 10 MG PO TABS: 10.0000 mg | ORAL_TABLET | Freq: Every day | ORAL | Status: DC

## 2015-11-06 NOTE — Telephone Encounter (Signed)
Spoke to pt, told him Rx was sent to pharmacy 10 mg tablet and I faxed over office notes for 11/23 to Dr. Rolena Infante. Pt verbalized understanding.

## 2015-11-06 NOTE — Telephone Encounter (Signed)
Follow Up  Pt called states that he wasn't aware that his GXT was canceled and he wanted to know if the myocardial perfusion was because of his back pain. Was it scheduled in place of the GXT. Please call back to clarify

## 2015-11-06 NOTE — Telephone Encounter (Signed)
Dustin Henry called needing a refill on his amLODipine (NORVASC) 5 MG tablet. He would like to see if he can get that in a 10 mg instead of him taking two 5 mg daily. He also wanted to know if the notes from his appointment 11/01/2015 are being sent to Dr. Rolena Infante at [facility].

## 2015-11-06 NOTE — Telephone Encounter (Signed)
Returned call to patient.He stated he has appointment for a stress test 11/13/15.Stated he would like sooner if possible.Stated he has a new patient appointment tomorrow 11/07/15 at 8:45 am with Dr.Crenshaw.Advised schedulers will call back to reschedule myoview.

## 2015-11-07 ENCOUNTER — Encounter: Payer: Self-pay | Admitting: *Deleted

## 2015-11-07 ENCOUNTER — Ambulatory Visit (INDEPENDENT_AMBULATORY_CARE_PROVIDER_SITE_OTHER): Payer: 59 | Admitting: Cardiology

## 2015-11-07 ENCOUNTER — Encounter: Payer: Self-pay | Admitting: Cardiology

## 2015-11-07 VITALS — BP 160/90 | HR 93 | Ht 70.0 in | Wt 195.6 lb

## 2015-11-07 DIAGNOSIS — R9431 Abnormal electrocardiogram [ECG] [EKG]: Secondary | ICD-10-CM

## 2015-11-07 DIAGNOSIS — Z01810 Encounter for preprocedural cardiovascular examination: Secondary | ICD-10-CM | POA: Diagnosis not present

## 2015-11-07 DIAGNOSIS — I10 Essential (primary) hypertension: Secondary | ICD-10-CM

## 2015-11-07 NOTE — Assessment & Plan Note (Signed)
Blood pressure elevated today. I have asked him to follow his blood pressure and additional medications can be added as needed.

## 2015-11-07 NOTE — Assessment & Plan Note (Signed)
Patient is scheduled for upcoming back surgery. His symptoms are atypical and unlikely to be cardiac. However his electrocardiogram does show inferior lateral T-wave inversion. We will plan a Lexiscan nuclear study for risk stratification. If normal he may proceed with his back surgery. I will also arrange an echocardiogram to evaluate left ventricular wall thickness.

## 2015-11-07 NOTE — Patient Instructions (Signed)
Medication Instructions:   NO CHANGE  Testing/Procedures:  Your physician has requested that you have a lexiscan myoview. For further information please visit [email or web address]. Please follow instruction sheet, as given.   Your physician has requested that you have an echocardiogram. Echocardiography is a painless test that uses sound waves to create images of your heart. It provides your doctor with information about the size and shape of your heart and how well your heart's chambers and valves are working. This procedure takes approximately one hour. There are no restrictions for this procedure.    Follow-Up:  Your physician recommends that you schedule a follow-up appointment in: AS NEEDED PENDING TEST RESULTS   If you need a refill on your cardiac medications before your next appointment, please call your pharmacy.

## 2015-11-07 NOTE — Assessment & Plan Note (Signed)
Plan nuclear study and echocardiogram to more fully assess.

## 2015-11-08 ENCOUNTER — Other Ambulatory Visit: Payer: Self-pay

## 2015-11-08 ENCOUNTER — Ambulatory Visit (HOSPITAL_COMMUNITY): Payer: 59 | Attending: Cardiovascular Disease

## 2015-11-08 ENCOUNTER — Inpatient Hospital Stay (HOSPITAL_COMMUNITY): Admission: RE | Admit: 2015-11-08 | Payer: 59 | Source: Ambulatory Visit | Admitting: Orthopedic Surgery

## 2015-11-08 ENCOUNTER — Encounter (HOSPITAL_COMMUNITY): Admission: RE | Payer: Self-pay | Source: Ambulatory Visit

## 2015-11-08 ENCOUNTER — Telehealth (HOSPITAL_COMMUNITY): Payer: Self-pay

## 2015-11-08 DIAGNOSIS — Z87891 Personal history of nicotine dependence: Secondary | ICD-10-CM | POA: Insufficient documentation

## 2015-11-08 DIAGNOSIS — R9431 Abnormal electrocardiogram [ECG] [EKG]: Secondary | ICD-10-CM | POA: Diagnosis not present

## 2015-11-08 DIAGNOSIS — E785 Hyperlipidemia, unspecified: Secondary | ICD-10-CM | POA: Insufficient documentation

## 2015-11-08 DIAGNOSIS — I10 Essential (primary) hypertension: Secondary | ICD-10-CM | POA: Diagnosis not present

## 2015-11-08 DIAGNOSIS — I340 Nonrheumatic mitral (valve) insufficiency: Secondary | ICD-10-CM | POA: Insufficient documentation

## 2015-11-08 DIAGNOSIS — I517 Cardiomegaly: Secondary | ICD-10-CM | POA: Insufficient documentation

## 2015-11-08 SURGERY — TRANSFORAMINAL LUMBAR INTERBODY FUSION (TLIF) WITH PEDICLE SCREW FIXATION 1 LEVEL
Anesthesia: General

## 2015-11-08 NOTE — Telephone Encounter (Signed)
Encounter complete. 

## 2015-11-10 ENCOUNTER — Encounter: Payer: Self-pay | Admitting: *Deleted

## 2015-11-10 ENCOUNTER — Ambulatory Visit (HOSPITAL_COMMUNITY)
Admission: RE | Admit: 2015-11-10 | Discharge: 2015-11-10 | Disposition: A | Discharge status: Home / Self Care | Payer: 59 | Source: Ambulatory Visit | Attending: Cardiology | Admitting: Cardiology

## 2015-11-10 ENCOUNTER — Encounter (HOSPITAL_COMMUNITY): Payer: 59

## 2015-11-10 DIAGNOSIS — R079 Chest pain, unspecified: Secondary | ICD-10-CM | POA: Diagnosis not present

## 2015-11-10 DIAGNOSIS — R0609 Other forms of dyspnea: Secondary | ICD-10-CM | POA: Diagnosis not present

## 2015-11-10 DIAGNOSIS — F17200 Nicotine dependence, unspecified, uncomplicated: Secondary | ICD-10-CM | POA: Insufficient documentation

## 2015-11-10 DIAGNOSIS — R9431 Abnormal electrocardiogram [ECG] [EKG]: Secondary | ICD-10-CM | POA: Diagnosis not present

## 2015-11-10 DIAGNOSIS — I10 Essential (primary) hypertension: Secondary | ICD-10-CM | POA: Diagnosis not present

## 2015-11-10 LAB — MYOCARDIAL PERFUSION IMAGING
CHL CUP NUCLEAR SDS: 5
CHL CUP NUCLEAR SRS: 0
CHL CUP NUCLEAR SSS: 5
CHL CUP RESTING HR STRESS: 75 {beats}/min
LV sys vol: 47 mL
LVDIAVOL: 121 mL
Peak HR: 107 {beats}/min
TID: 1.18

## 2015-11-10 MED ORDER — REGADENOSON 0.4 MG/5ML IV SOLN
0.4000 mg | Freq: Once | INTRAVENOUS | Status: AC
  Administered 2015-11-10: 0.4 mg via INTRAVENOUS

## 2015-11-10 MED ORDER — TECHNETIUM TC 99M SESTAMIBI GENERIC - CARDIOLITE
31.0000 | Freq: Once | INTRAVENOUS | Status: AC | PRN
  Administered 2015-11-10: 31 via INTRAVENOUS

## 2015-11-10 MED ORDER — TECHNETIUM TC 99M SESTAMIBI GENERIC - CARDIOLITE
10.7000 | Freq: Once | INTRAVENOUS | Status: AC | PRN
  Administered 2015-11-10: 11 via INTRAVENOUS

## 2015-11-10 MED ORDER — AMINOPHYLLINE 25 MG/ML IV SOLN
125.0000 mg | Freq: Once | INTRAVENOUS | Status: AC
  Administered 2015-11-10: 125 mg via INTRAVENOUS

## 2015-11-13 ENCOUNTER — Encounter: Payer: Self-pay | Admitting: *Deleted

## 2015-11-13 ENCOUNTER — Telehealth: Payer: Self-pay | Admitting: Cardiology

## 2015-11-13 ENCOUNTER — Ambulatory Visit (HOSPITAL_COMMUNITY): Payer: 59

## 2015-11-13 NOTE — Telephone Encounter (Signed)
Patient states [facility]. Is still waiting to receive the hand written note that clears him for surgery.

## 2015-11-13 NOTE — Telephone Encounter (Signed)
Pt walked into the lobby.  Clearance letter given to the pt, he is going to deliver to [facility] orthopedic

## 2015-11-16 ENCOUNTER — Ambulatory Visit: Payer: Self-pay | Admitting: Physician Assistant

## 2015-11-16 NOTE — Progress Notes (Addendum)
Anesthesia follow-up: See my note from 10/30/15 with update on 11/01/15. Patient was scheduled for L5-S1 TLIF on 11/08/15 but reported new symptoms of left facial tingling/"oozing" with radiation to his left chest and had EKG changes, so he was referred back to his PCP Dr. Burnice Logan. He was seen by Dorothyann Peng, NP. EKG was repeated and showed worsening T wave abnormality. Cardiology was called who recommended preoperative cardiology evaluation. Patient was seen by Dr. Kirk Ruths. Although symptoms felt atypical for cardiac etiology, due to the inferolateral T wave inversion a stress test and echo were recommended (see below). There were no other neurologic or cardiac symptoms associated with his facial tingling.  11/08/15 Echo: Study Conclusions - Left ventricle: The cavity size was normal. Wall thickness was increased in a pattern of mild LVH. Systolic function was normal. The estimated ejection fraction was in the range of 60% to 65%. Wall motion was normal; there were no regional wall motion abnormalities. Left ventricular diastolic function parameters were normal. - Mitral valve: There was mild regurgitation. - Atrial septum: No defect or patent foramen ovale was identified.  11/10/15 Nuclear stress test:  Nuclear stress EF: 62%.  There was no ST segment deviation noted during stress.  T wave inversion was noted during stress.  The study is normal.  This is a low risk study.  The left ventricular ejection fraction is normal (55-65%). Low risk stress nuclear study with diaphragmatic attenuation artifact, otherwise normal perfusion and normal left ventricular regional and global systolic function.  11/01/15 EKG: ST at 106, ST/T wave abnormality, consider inferolateral ischemia.   Dr. Stanford Breed felt patient is "low risk cardiac wise for surgery." See Letters tab.  Labs will be outdated, so will see if our scheduler can get him in for a lab only visit prior to his  surgery which has been rescheduled for 11/22/15 at 12N with Dr. Rolena Infante.  [facility] Short Stay Center/Anesthesiology Phone [phone] 11/16/2015 11:02 AM  Update: Repeat labs done today. BP 128/69. As above, he has been cleared by cardiology. If no acute changes then I would anticipate that he could proceed as planned.   [facility] Short Stay Center/Anesthesiology Phone [phone] 11/17/2015 11:28 AM

## 2015-11-17 ENCOUNTER — Encounter (HOSPITAL_COMMUNITY): Payer: Self-pay

## 2015-11-17 ENCOUNTER — Encounter (HOSPITAL_COMMUNITY)
Admission: RE | Admit: 2015-11-17 | Discharge: 2015-11-17 | Disposition: A | Discharge status: Home / Self Care | Payer: 59 | Source: Ambulatory Visit | Attending: Orthopedic Surgery | Admitting: Orthopedic Surgery

## 2015-11-17 DIAGNOSIS — M5410 Radiculopathy, site unspecified: Secondary | ICD-10-CM | POA: Insufficient documentation

## 2015-11-17 DIAGNOSIS — Z01812 Encounter for preprocedural laboratory examination: Secondary | ICD-10-CM | POA: Insufficient documentation

## 2015-11-17 DIAGNOSIS — Z0183 Encounter for blood typing: Secondary | ICD-10-CM | POA: Diagnosis not present

## 2015-11-17 LAB — BASIC METABOLIC PANEL
ANION GAP: 10 (ref 5–15)
BUN: 7 mg/dL (ref 6–20)
CALCIUM: 9.6 mg/dL (ref 8.9–10.3)
CO2: 23 mmol/L (ref 22–32)
CREATININE: 0.9 mg/dL (ref 0.61–1.24)
Chloride: 106 mmol/L (ref 101–111)
GFR calc non Af Amer: >60 mL/min (ref >60)
Glucose, Bld: 138 mg/dL — ABNORMAL HIGH (ref 65–99)
Potassium: 3.9 mmol/L (ref 3.5–5.1)
SODIUM: 139 mmol/L (ref 135–145)

## 2015-11-17 LAB — CBC
HCT: 42.4 % (ref 39.0–52.0)
HEMOGLOBIN: 14.5 g/dL (ref 13.0–17.0)
MCH: 30.7 pg (ref 26.0–34.0)
MCHC: 34.2 g/dL (ref 30.0–36.0)
MCV: 89.6 fL (ref 78.0–100.0)
PLATELETS: 229 10*3/uL (ref 150–400)
RBC: 4.73 MIL/uL (ref 4.22–5.81)
RDW: 12.2 % (ref 11.5–15.5)
WBC: 4.2 10*3/uL (ref 4.0–10.5)

## 2015-11-17 MED ORDER — VANCOMYCIN HCL 10 G IV SOLR: 1500.0000 mg | INTRAVENOUS | Status: DC

## 2015-11-17 NOTE — Pre-Procedure Instructions (Signed)
    MELBIN PACH  11/17/2015      [facility] [address], [place] AT [address] [place] [address] Phone: [phone] Fax: [phone]    Your procedure is scheduled on Wednesday November 22, 2015 at 1200.  Report to [facility] Admitting at 1000 A.M.  Call this number if you have problems the morning of surgery:  [phone]   Remember:  Do not eat food or drink liquids after midnight.  Take these medicines the morning of surgery with A SIP OF WATER : Norvasc, Neurontin, Paxil, Phenergan and pain pill as needed. Stop taking all NSAIDS, aspirin products, iburprofen products, fish oils, vitamin  and herbal supplements for five days prior to surgery.   Do not wear jewelry.  Do not wear lotions, powders.  You may wear deodorant.  Do not shave 48 hours prior to surgery.  Men may shave face and neck.  Do not bring valuables to the hospital.  [facility] is not responsible for any belongings or valuables.  Contacts, dentures or bridgework may not be worn into surgery.  Leave your suitcase in the car.  After surgery it may be brought to your room.  For patients admitted to the hospital, discharge time will be determined by your treatment team.  Patients discharged the day of surgery will not be allowed to drive home.    Please read over the following fact sheets that you were given. Pain Booklet, Coughing and Deep Breathing, Blood Transfusion Information and Surgical Site Infection Prevention

## 2015-11-18 LAB — TYPE AND SCREEN
ABO/RH(D): A POS
ANTIBODY SCREEN: NEGATIVE

## 2015-11-22 ENCOUNTER — Inpatient Hospital Stay (HOSPITAL_COMMUNITY): Payer: 59 | Admitting: Vascular Surgery

## 2015-11-22 ENCOUNTER — Encounter (HOSPITAL_COMMUNITY): Payer: Self-pay | Admitting: *Deleted

## 2015-11-22 ENCOUNTER — Inpatient Hospital Stay (HOSPITAL_COMMUNITY)
Admission: RE | Admit: 2015-11-22 | Discharge: 2015-11-24 | DRG: 460 | Disposition: A | Discharge status: Home / Self Care | Payer: 59 | Source: Ambulatory Visit | Attending: Orthopedic Surgery | Admitting: Orthopedic Surgery

## 2015-11-22 ENCOUNTER — Encounter (HOSPITAL_COMMUNITY)
Admission: RE | Disposition: A | Discharge status: Home / Self Care | Payer: Self-pay | Source: Ambulatory Visit | Attending: Orthopedic Surgery

## 2015-11-22 ENCOUNTER — Inpatient Hospital Stay (HOSPITAL_COMMUNITY): Payer: 59

## 2015-11-22 DIAGNOSIS — K5903 Drug induced constipation: Secondary | ICD-10-CM | POA: Diagnosis not present

## 2015-11-22 DIAGNOSIS — M4317 Spondylolisthesis, lumbosacral region: Principal | ICD-10-CM | POA: Diagnosis present

## 2015-11-22 DIAGNOSIS — Z79899 Other long term (current) drug therapy: Secondary | ICD-10-CM

## 2015-11-22 DIAGNOSIS — T40605A Adverse effect of unspecified narcotics, initial encounter: Secondary | ICD-10-CM | POA: Diagnosis not present

## 2015-11-22 DIAGNOSIS — Z9889 Other specified postprocedural states: Secondary | ICD-10-CM

## 2015-11-22 DIAGNOSIS — Z87891 Personal history of nicotine dependence: Secondary | ICD-10-CM

## 2015-11-22 DIAGNOSIS — Z419 Encounter for procedure for purposes other than remedying health state, unspecified: Secondary | ICD-10-CM

## 2015-11-22 DIAGNOSIS — M545 Low back pain: Secondary | ICD-10-CM | POA: Diagnosis present

## 2015-11-22 DIAGNOSIS — M549 Dorsalgia, unspecified: Secondary | ICD-10-CM | POA: Diagnosis present

## 2015-11-22 HISTORY — PX: POSTERIOR CERVICAL FUSION/FORAMINOTOMY: SHX5038

## 2015-11-22 SURGERY — POSTERIOR CERVICAL FUSION/FORAMINOTOMY LEVEL 1
Anesthesia: General

## 2015-11-22 MED ORDER — BUPIVACAINE-EPINEPHRINE 0.25% -1:200000 IJ SOLN
INTRAMUSCULAR | Status: DC | PRN
  Administered 2015-11-22: 10 mL

## 2015-11-22 MED ORDER — ACETAMINOPHEN 10 MG/ML IV SOLN
INTRAVENOUS | Status: AC
  Filled 2015-11-22: qty 100

## 2015-11-22 MED ORDER — OXYCODONE HCL 5 MG PO TABS
10.0000 mg | ORAL_TABLET | ORAL | Status: DC | PRN
  Administered 2015-11-22 – 2015-11-23 (×3): 10 mg via ORAL
  Filled 2015-11-22 (×2): qty 2

## 2015-11-22 MED ORDER — FENTANYL CITRATE (PF) 100 MCG/2ML IJ SOLN
INTRAMUSCULAR | Status: AC
  Filled 2015-11-22: qty 2

## 2015-11-22 MED ORDER — VANCOMYCIN HCL 10 G IV SOLR
1250.0000 mg | Freq: Once | INTRAVENOUS | Status: AC
  Administered 2015-11-22: 1250 mg via INTRAVENOUS
  Filled 2015-11-22: qty 1250

## 2015-11-22 MED ORDER — DEXAMETHASONE SODIUM PHOSPHATE 10 MG/ML IJ SOLN
INTRAMUSCULAR | Status: AC
  Filled 2015-11-22: qty 1

## 2015-11-22 MED ORDER — PHENYLEPHRINE HCL 10 MG/ML IJ SOLN
INTRAMUSCULAR | Status: DC | PRN
  Administered 2015-11-22: 120 ug via INTRAVENOUS
  Administered 2015-11-22: 80 ug via INTRAVENOUS
  Administered 2015-11-22: 120 ug via INTRAVENOUS
  Administered 2015-11-22: 80 ug via INTRAVENOUS

## 2015-11-22 MED ORDER — HYDROMORPHONE HCL 1 MG/ML IJ SOLN
INTRAMUSCULAR | Status: AC
  Administered 2015-11-22: 0.5 mg via INTRAVENOUS
  Filled 2015-11-22: qty 1

## 2015-11-22 MED ORDER — GABAPENTIN 100 MG PO CAPS
100.0000 mg | ORAL_CAPSULE | Freq: Three times a day (TID) | ORAL | Status: DC
  Administered 2015-11-22 – 2015-11-24 (×6): 100 mg via ORAL
  Filled 2015-11-22 (×6): qty 1

## 2015-11-22 MED ORDER — THROMBIN 20000 UNITS EX SOLR
CUTANEOUS | Status: DC | PRN
  Administered 2015-11-22: 20 mL via TOPICAL

## 2015-11-22 MED ORDER — AMLODIPINE BESYLATE 10 MG PO TABS
10.0000 mg | ORAL_TABLET | Freq: Every day | ORAL | Status: DC
  Administered 2015-11-23 – 2015-11-24 (×2): 10 mg via ORAL
  Filled 2015-11-22 (×2): qty 1

## 2015-11-22 MED ORDER — LIDOCAINE HCL (CARDIAC) 20 MG/ML IV SOLN
INTRAVENOUS | Status: AC
  Filled 2015-11-22: qty 5

## 2015-11-22 MED ORDER — THROMBIN 20000 UNITS EX SOLR
CUTANEOUS | Status: DC | PRN
  Administered 2015-11-22: 20000 [IU] via TOPICAL

## 2015-11-22 MED ORDER — MORPHINE SULFATE (PF) 4 MG/ML IV SOLN
INTRAVENOUS | Status: AC
  Administered 2015-11-22: 4 mg
  Filled 2015-11-22: qty 1

## 2015-11-22 MED ORDER — MORPHINE SULFATE (PF) 2 MG/ML IV SOLN
1.0000 mg | INTRAVENOUS | Status: DC | PRN
  Administered 2015-11-22 – 2015-11-23 (×3): 4 mg via INTRAVENOUS
  Filled 2015-11-22 (×3): qty 2

## 2015-11-22 MED ORDER — DEXTROSE 5 % IV SOLN
500.0000 mg | Freq: Four times a day (QID) | INTRAVENOUS | Status: DC | PRN
  Filled 2015-11-22: qty 5

## 2015-11-22 MED ORDER — LISINOPRIL 20 MG PO TABS
20.0000 mg | ORAL_TABLET | Freq: Every day | ORAL | Status: DC
  Administered 2015-11-23 – 2015-11-24 (×2): 20 mg via ORAL
  Filled 2015-11-22 (×2): qty 1

## 2015-11-22 MED ORDER — SODIUM CHLORIDE 0.9 % IJ SOLN: 3.0000 mL | INTRAMUSCULAR | Status: DC | PRN

## 2015-11-22 MED ORDER — ARTIFICIAL TEARS OP OINT
TOPICAL_OINTMENT | OPHTHALMIC | Status: AC
  Filled 2015-11-22: qty 3.5

## 2015-11-22 MED ORDER — SUCCINYLCHOLINE 20MG/ML (10ML) SYRINGE FOR MEDFUSION PUMP - OPTIME
INTRAMUSCULAR | Status: DC | PRN
  Administered 2015-11-22: 100 mg via INTRAVENOUS

## 2015-11-22 MED ORDER — MAGNESIUM CITRATE PO SOLN: 0.5000 | Freq: Once | ORAL | Status: DC

## 2015-11-22 MED ORDER — DEXAMETHASONE SODIUM PHOSPHATE 10 MG/ML IJ SOLN
INTRAMUSCULAR | Status: DC | PRN
  Administered 2015-11-22: 10 mg via INTRAVENOUS

## 2015-11-22 MED ORDER — METHOCARBAMOL 500 MG PO TABS
500.0000 mg | ORAL_TABLET | Freq: Four times a day (QID) | ORAL | Status: DC | PRN
  Administered 2015-11-22 – 2015-11-24 (×6): 500 mg via ORAL
  Filled 2015-11-22 (×5): qty 1

## 2015-11-22 MED ORDER — EPHEDRINE SULFATE 50 MG/ML IJ SOLN
INTRAMUSCULAR | Status: AC
  Filled 2015-11-22: qty 1

## 2015-11-22 MED ORDER — SODIUM CHLORIDE 0.9 % IV SOLN: 250.0000 mL | INTRAVENOUS | Status: DC

## 2015-11-22 MED ORDER — FENTANYL CITRATE (PF) 250 MCG/5ML IJ SOLN
INTRAMUSCULAR | Status: DC | PRN
Start: 2015-11-22 — End: 2015-11-22
  Administered 2015-11-22 (×2): 50 ug via INTRAVENOUS
  Administered 2015-11-22: 150 ug via INTRAVENOUS

## 2015-11-22 MED ORDER — MORPHINE SULFATE (PF) 2 MG/ML IV SOLN
2.0000 mg | INTRAVENOUS | Status: DC | PRN
  Administered 2015-11-22 (×2): 2 mg via INTRAVENOUS

## 2015-11-22 MED ORDER — THROMBIN 20000 UNITS EX SOLR
CUTANEOUS | Status: AC
  Filled 2015-11-22: qty 20000

## 2015-11-22 MED ORDER — DEXAMETHASONE 4 MG PO TABS: 4.0000 mg | ORAL_TABLET | Freq: Four times a day (QID) | ORAL | Status: AC

## 2015-11-22 MED ORDER — MORPHINE SULFATE (PF) 2 MG/ML IV SOLN
INTRAVENOUS | Status: AC
  Filled 2015-11-22: qty 1

## 2015-11-22 MED ORDER — ONDANSETRON HCL 4 MG/2ML IJ SOLN
4.0000 mg | INTRAMUSCULAR | Status: DC | PRN
  Administered 2015-11-22 – 2015-11-23 (×2): 4 mg via INTRAVENOUS
  Filled 2015-11-22 (×2): qty 2

## 2015-11-22 MED ORDER — PROPOFOL 500 MG/50ML IV EMUL
INTRAVENOUS | Status: DC | PRN
  Administered 2015-11-22: 50 ug/kg/min via INTRAVENOUS

## 2015-11-22 MED ORDER — PHENYLEPHRINE HCL 10 MG/ML IJ SOLN
10.0000 mg | INTRAVENOUS | Status: DC | PRN
  Administered 2015-11-22: 50 ug/min via INTRAVENOUS
  Administered 2015-11-22: 16:00:00 via INTRAVENOUS

## 2015-11-22 MED ORDER — DOCUSATE SODIUM 100 MG PO CAPS
100.0000 mg | ORAL_CAPSULE | Freq: Two times a day (BID) | ORAL | Status: DC
  Administered 2015-11-22 – 2015-11-24 (×4): 100 mg via ORAL
  Filled 2015-11-22 (×4): qty 1

## 2015-11-22 MED ORDER — LIDOCAINE HCL (CARDIAC) 20 MG/ML IV SOLN
INTRAVENOUS | Status: DC | PRN
  Administered 2015-11-22: 100 mg via INTRAVENOUS

## 2015-11-22 MED ORDER — HEMOSTATIC AGENTS (NO CHARGE) OPTIME
TOPICAL | Status: DC | PRN
  Administered 2015-11-22: 1 via TOPICAL

## 2015-11-22 MED ORDER — GLYCOPYRROLATE 0.2 MG/ML IJ SOLN
INTRAMUSCULAR | Status: DC | PRN
  Administered 2015-11-22: 0.2 mg via INTRAVENOUS

## 2015-11-22 MED ORDER — HYDROMORPHONE HCL 1 MG/ML IJ SOLN
INTRAMUSCULAR | Status: DC | PRN
  Administered 2015-11-22 (×2): .1 mg via INTRAVENOUS
  Administered 2015-11-22: .5 mg via INTRAVENOUS
  Administered 2015-11-22 (×3): .1 mg via INTRAVENOUS

## 2015-11-22 MED ORDER — VANCOMYCIN HCL IN DEXTROSE 1-5 GM/200ML-% IV SOLN
1000.0000 mg | INTRAVENOUS | Status: AC
  Administered 2015-11-22: 1000 mg via INTRAVENOUS
  Filled 2015-11-22: qty 200

## 2015-11-22 MED ORDER — DEXAMETHASONE SODIUM PHOSPHATE 4 MG/ML IJ SOLN
4.0000 mg | Freq: Four times a day (QID) | INTRAMUSCULAR | Status: AC
  Administered 2015-11-22 – 2015-11-23 (×3): 4 mg via INTRAVENOUS
  Filled 2015-11-22 (×3): qty 1

## 2015-11-22 MED ORDER — OXYCODONE HCL 5 MG PO TABS
ORAL_TABLET | ORAL | Status: AC
  Administered 2015-11-22: 10 mg via ORAL
  Filled 2015-11-22: qty 2

## 2015-11-22 MED ORDER — FENTANYL CITRATE (PF) 100 MCG/2ML IJ SOLN
INTRAMUSCULAR | Status: AC
Start: 2015-11-22 — End: 2015-11-23
  Filled 2015-11-22: qty 2

## 2015-11-22 MED ORDER — MIDAZOLAM HCL 2 MG/2ML IJ SOLN
INTRAMUSCULAR | Status: DC | PRN
Start: 2015-11-22 — End: 2015-11-22
  Administered 2015-11-22: 2 mg via INTRAVENOUS

## 2015-11-22 MED ORDER — FENTANYL CITRATE (PF) 100 MCG/2ML IJ SOLN
25.0000 ug | INTRAMUSCULAR | Status: DC | PRN
  Administered 2015-11-22 (×2): 50 ug via INTRAVENOUS

## 2015-11-22 MED ORDER — 0.9 % SODIUM CHLORIDE (POUR BTL) OPTIME
TOPICAL | Status: DC | PRN
  Administered 2015-11-22: 1000 mL

## 2015-11-22 MED ORDER — PHENOL 1.4 % MT LIQD: 1.0000 | OROMUCOSAL | Status: DC | PRN

## 2015-11-22 MED ORDER — HYDROCHLOROTHIAZIDE 25 MG PO TABS
25.0000 mg | ORAL_TABLET | Freq: Every day | ORAL | Status: DC
  Administered 2015-11-23 – 2015-11-24 (×2): 25 mg via ORAL
  Filled 2015-11-22 (×2): qty 1

## 2015-11-22 MED ORDER — GLYCOPYRROLATE 0.2 MG/ML IJ SOLN
INTRAMUSCULAR | Status: AC
  Filled 2015-11-22: qty 1

## 2015-11-22 MED ORDER — LISINOPRIL-HYDROCHLOROTHIAZIDE 20-25 MG PO TABS: 1.0000 | ORAL_TABLET | Freq: Every day | ORAL | Status: DC

## 2015-11-22 MED ORDER — BUPIVACAINE-EPINEPHRINE (PF) 0.25% -1:200000 IJ SOLN
INTRAMUSCULAR | Status: AC
  Filled 2015-11-22: qty 30

## 2015-11-22 MED ORDER — METHOCARBAMOL 500 MG PO TABS
ORAL_TABLET | ORAL | Status: AC
  Administered 2015-11-22: 500 mg via ORAL
  Filled 2015-11-22: qty 1

## 2015-11-22 MED ORDER — MENTHOL 3 MG MT LOZG: 1.0000 | LOZENGE | OROMUCOSAL | Status: DC | PRN

## 2015-11-22 MED ORDER — SODIUM CHLORIDE 0.9 % IJ SOLN
3.0000 mL | Freq: Two times a day (BID) | INTRAMUSCULAR | Status: DC
  Administered 2015-11-22 – 2015-11-24 (×3): 3 mL via INTRAVENOUS

## 2015-11-22 MED ORDER — PROPOFOL 10 MG/ML IV BOLUS
INTRAVENOUS | Status: DC | PRN
  Administered 2015-11-22 (×2): 200 mg via INTRAVENOUS

## 2015-11-22 MED ORDER — ONDANSETRON HCL 4 MG/2ML IJ SOLN
INTRAMUSCULAR | Status: DC | PRN
  Administered 2015-11-22: 4 mg via INTRAVENOUS

## 2015-11-22 MED ORDER — LACTATED RINGERS IV SOLN
INTRAVENOUS | Status: DC
Start: 2015-11-22 — End: 2015-11-24
  Administered 2015-11-22 (×3): via INTRAVENOUS

## 2015-11-22 MED ORDER — LACTATED RINGERS IV SOLN: INTRAVENOUS | Status: DC

## 2015-11-22 MED ORDER — FENTANYL CITRATE (PF) 250 MCG/5ML IJ SOLN
INTRAMUSCULAR | Status: AC
  Filled 2015-11-22: qty 5

## 2015-11-22 MED ORDER — HYDROMORPHONE HCL 1 MG/ML IJ SOLN
INTRAMUSCULAR | Status: AC
  Filled 2015-11-22: qty 1

## 2015-11-22 MED ORDER — FLEET ENEMA 7-19 GM/118ML RE ENEM: 1.0000 | ENEMA | Freq: Once | RECTAL | Status: DC

## 2015-11-22 MED ORDER — HYDROMORPHONE HCL 1 MG/ML IJ SOLN
0.2500 mg | INTRAMUSCULAR | Status: DC | PRN
  Administered 2015-11-22 (×4): 0.5 mg via INTRAVENOUS

## 2015-11-22 MED ORDER — EPHEDRINE SULFATE 50 MG/ML IJ SOLN
INTRAMUSCULAR | Status: DC | PRN
  Administered 2015-11-22 (×3): 10 mg via INTRAVENOUS

## 2015-11-22 MED ORDER — MIDAZOLAM HCL 2 MG/2ML IJ SOLN
INTRAMUSCULAR | Status: AC
  Filled 2015-11-22: qty 2

## 2015-11-22 MED ORDER — ACETAMINOPHEN 10 MG/ML IV SOLN
INTRAVENOUS | Status: DC | PRN
  Administered 2015-11-22: 1000 mg via INTRAVENOUS

## 2015-11-22 MED ORDER — PAROXETINE HCL 20 MG PO TABS
20.0000 mg | ORAL_TABLET | Freq: Every morning | ORAL | Status: DC
  Administered 2015-11-23 – 2015-11-24 (×2): 20 mg via ORAL
  Filled 2015-11-22 (×2): qty 1

## 2015-11-22 MED ORDER — MORPHINE SULFATE (PF) 2 MG/ML IV SOLN
INTRAVENOUS | Status: AC
Start: 2015-11-22 — End: 2015-11-23
  Filled 2015-11-22: qty 1

## 2015-11-22 MED ORDER — PHENYLEPHRINE 40 MCG/ML (10ML) SYRINGE FOR IV PUSH (FOR BLOOD PRESSURE SUPPORT)
PREFILLED_SYRINGE | INTRAVENOUS | Status: AC
  Filled 2015-11-22: qty 10

## 2015-11-22 MED ORDER — ONDANSETRON HCL 4 MG/2ML IJ SOLN
INTRAMUSCULAR | Status: AC
  Filled 2015-11-22: qty 2

## 2015-11-22 MED ORDER — SUCCINYLCHOLINE CHLORIDE 20 MG/ML IJ SOLN
INTRAMUSCULAR | Status: AC
  Filled 2015-11-22: qty 1

## 2015-11-22 SURGICAL SUPPLY — 89 items
ADH SKN CLS APL DERMABOND .7 (GAUZE/BANDAGES/DRESSINGS) ×1
BLADE SURG ROTATE 9660 (MISCELLANEOUS) IMPLANT
CLIP NEUROVISION LG (CLIP) ×1 IMPLANT
CORDS BIPOLAR (ELECTRODE) ×2 IMPLANT
COVER MAYO STAND STRL (DRAPES) ×4 IMPLANT
COVER SURGICAL LIGHT HANDLE (MISCELLANEOUS) ×2 IMPLANT
DERMABOND ADVANCED (GAUZE/BANDAGES/DRESSINGS) ×1
DERMABOND ADVANCED .7 DNX12 (GAUZE/BANDAGES/DRESSINGS) ×1 IMPLANT
DRAPE C-ARM 42X72 X-RAY (DRAPES) ×4 IMPLANT
DRAPE POUCH INSTRU U-SHP 10X18 (DRAPES) ×2 IMPLANT
DRAPE SURG 17X23 STRL (DRAPES) ×2 IMPLANT
DRAPE U-SHAPE 47X51 STRL (DRAPES) ×2 IMPLANT
DRSG ADAPTIC 3X8 NADH LF (GAUZE/BANDAGES/DRESSINGS) ×2 IMPLANT
DRSG MEPILEX BORDER 4X8 (GAUZE/BANDAGES/DRESSINGS) ×2 IMPLANT
DURAPREP 26ML APPLICATOR (WOUND CARE) ×2 IMPLANT
ELECT BLADE 4.0 EZ CLEAN MEGAD (MISCELLANEOUS)
ELECT BLADE 6.5 EXT (BLADE) ×2 IMPLANT
ELECT CAUTERY BLADE 6.4 (BLADE) ×2 IMPLANT
ELECT PENCIL ROCKER SW 15FT (MISCELLANEOUS) ×2 IMPLANT
ELECT REM PT RETURN 9FT ADLT (ELECTROSURGICAL) ×2
ELECTRODE BLDE 4.0 EZ CLN MEGD (MISCELLANEOUS) IMPLANT
ELECTRODE REM PT RTRN 9FT ADLT (ELECTROSURGICAL) ×1 IMPLANT
EVACUATOR 1/8 PVC DRAIN (DRAIN) IMPLANT
GLOVE BIO SURGEON STRL SZ 6.5 (GLOVE) ×1 IMPLANT
GLOVE BIOGEL PI IND STRL 6.5 (GLOVE) IMPLANT
GLOVE BIOGEL PI IND STRL 8 (GLOVE) ×1 IMPLANT
GLOVE BIOGEL PI IND STRL 8.5 (GLOVE) ×1 IMPLANT
GLOVE BIOGEL PI INDICATOR 6.5 (GLOVE) ×2
GLOVE BIOGEL PI INDICATOR 8 (GLOVE) ×1
GLOVE BIOGEL PI INDICATOR 8.5 (GLOVE) ×1
GLOVE ORTHO TXT STRL SZ7.5 (GLOVE) ×2 IMPLANT
GLOVE SS BIOGEL STRL SZ 8.5 (GLOVE) ×1 IMPLANT
GLOVE SUPERSENSE BIOGEL SZ 8.5 (GLOVE) ×2
GLOVE SURG SS PI 6.5 STRL IVOR (GLOVE) ×1 IMPLANT
GOWN STRL REUS W/ TWL LRG LVL3 (GOWN DISPOSABLE) ×1 IMPLANT
GOWN STRL REUS W/TWL 2XL LVL3 (GOWN DISPOSABLE) ×4 IMPLANT
GOWN STRL REUS W/TWL LRG LVL3 (GOWN DISPOSABLE) ×4
GUIDEWIRE NITINOL BEVEL TIP (WIRE) ×4 IMPLANT
IV CATH 14GX2 1/4 (CATHETERS) IMPLANT
KIT BASIN OR (CUSTOM PROCEDURE TRAY) ×2 IMPLANT
KIT NDL NVM5 EMG ELECT (KITS) IMPLANT
KIT NEEDLE NVM5 EMG ELECT (KITS) ×1 IMPLANT
KIT NEEDLE NVM5 EMG ELECTRODE (KITS) ×1
KIT POSITION SURG JACKSON T1 (MISCELLANEOUS) ×2 IMPLANT
KIT ROOM TURNOVER OR (KITS) ×2 IMPLANT
LIGHT SOURCE ANGLE TIP STR 7FT (MISCELLANEOUS) ×1 IMPLANT
NDL I-PASS III (NEEDLE) IMPLANT
NEEDLE 22X1 1/2 (OR ONLY) (NEEDLE) ×2 IMPLANT
NEEDLE I-PASS III (NEEDLE) ×4 IMPLANT
NEEDLE SSEP/EMG (NEEDLE) ×1 IMPLANT
NS IRRIG 1000ML POUR BTL (IV SOLUTION) ×2 IMPLANT
PACK LAMINECTOMY ORTHO (CUSTOM PROCEDURE TRAY) ×2 IMPLANT
PACK UNIVERSAL I (CUSTOM PROCEDURE TRAY) ×2 IMPLANT
PAD ARMBOARD 7.5X6 YLW CONV (MISCELLANEOUS) ×4 IMPLANT
PATTIES SURGICAL .5 X.5 (GAUZE/BANDAGES/DRESSINGS) IMPLANT
PATTIES SURGICAL .5 X1 (DISPOSABLE) ×2 IMPLANT
PROBE BALL TIP NVM5 SNG USE (BALLOONS) ×1 IMPLANT
PUTTY DBX 1CC (Putty) ×2 IMPLANT
PUTTY DBX 1CC DEPUY (Putty) IMPLANT
ROD RELINE MAS LORD 5.5X45MM (Rod) ×1 IMPLANT
ROD RELINE MAS TI LORD 5.5X40 (Rod) ×1 IMPLANT
SCREW LOCK RELINE 5.5 TULIP (Screw) ×4 IMPLANT
SCREW RELINE RED 6.5X45MM POLY (Screw) ×2 IMPLANT
SCREW SHANK RELINE 6.5X45MM 2C (Screw) ×2 IMPLANT
SHEET CONFORM 45LX20WX5H (Bone Implant) ×1 IMPLANT
SPINE TULIP RELINE MOD (Neuro Prosthesis/Implant) ×4 IMPLANT
SPONGE LAP 4X18 X RAY DECT (DISPOSABLE) ×4 IMPLANT
SPONGE SURGIFOAM ABS GEL 100 (HEMOSTASIS) ×2 IMPLANT
STRIP CLOSURE SKIN 1/2X4 (GAUZE/BANDAGES/DRESSINGS) ×1 IMPLANT
SURGIFLO W/THROMBIN 8M KIT (HEMOSTASIS) IMPLANT
SURGILUBE 2OZ TUBE FLIPTOP (MISCELLANEOUS) IMPLANT
SUT BONE WAX W31G (SUTURE) ×2 IMPLANT
SUT ETHILON 3 0 FSL (SUTURE) IMPLANT
SUT PDS AB 1 CTX 36 (SUTURE) ×2 IMPLANT
SUT PROLENE 0 CT (SUTURE) ×2 IMPLANT
SUT PROLENE 2 0 CT2 30 (SUTURE) ×2 IMPLANT
SUT VIC AB 0 CTB1 27 (SUTURE) ×4 IMPLANT
SUT VIC AB 1 CTX 36 (SUTURE) ×4
SUT VIC AB 1 CTX36XBRD ANBCTR (SUTURE) ×2 IMPLANT
SUT VIC AB 2-0 CT1 18 (SUTURE) ×2 IMPLANT
SYR BULB IRRIGATION 50ML (SYRINGE) ×2 IMPLANT
SYR CONTROL 10ML LL (SYRINGE) ×2 IMPLANT
TLIF XLRG 11MM (Neuro Prosthesis/Implant) ×1 IMPLANT
TOWEL OR 17X24 6PK STRL BLUE (TOWEL DISPOSABLE) ×2 IMPLANT
TOWEL OR 17X26 10 PK STRL BLUE (TOWEL DISPOSABLE) ×2 IMPLANT
TRAY FOLEY CATH 16FRSI W/METER (SET/KITS/TRAYS/PACK) ×2 IMPLANT
TULIP SPINE RELINE MOD (Neuro Prosthesis/Implant) IMPLANT
WATER STERILE IRR 1000ML POUR (IV SOLUTION) ×2 IMPLANT
YANKAUER SUCT BULB TIP NO VENT (SUCTIONS) ×2 IMPLANT

## 2015-11-22 NOTE — Anesthesia Postprocedure Evaluation (Signed)
Anesthesia Post Note  Patient: Dustin Henry  Procedure(s) Performed: Procedure(s) (LRB): TLIF L5-S1 (N/A)  Patient location during evaluation: PACU Anesthesia Type: General Level of consciousness: awake and alert Pain management: pain level controlled Vital Signs Assessment: post-procedure vital signs reviewed and stable Respiratory status: spontaneous breathing, nonlabored ventilation, respiratory function stable and patient connected to nasal cannula oxygen Cardiovascular status: blood pressure returned to baseline and stable Postop Assessment: no signs of nausea or vomiting Anesthetic complications: no    Last Vitals:  Filed Vitals:   11/22/15 1011 11/22/15 1730  BP: 164/94 120/79  Pulse: 97   Temp: 36.7 C 36.7 C  Resp: 20     Last Pain: There were no vitals filed for this visit.  LLE Motor Response: Purposeful movement (11/22/15 1730) LLE Sensation: Full sensation (11/22/15 1730) RLE Motor Response: Purposeful movement (11/22/15 1730) RLE Sensation: Full sensation (11/22/15 1730)      Zenaida Deed

## 2015-11-22 NOTE — H&P (Signed)
History of Present Illness The patient is a 44 year old male who presents today for follow up of their back. The patient is being followed for their He returns to discuss his Nerve Studies that were done by Dr Nelva Bush on 09/25/15. They are now 6 month(s) out from when symptoms began (in May of this year). Symptoms reported today include: pain (across the low back and buttocks), pain at night, aching, stiffness, pain with weightbearing ("that comes and goes"), difficulty ambulating, weakness (left leg), numbness (left leg that stops to the ankle), burning (left heal), leg pain (left leg), pain with lying, pain with lifting, pain with sitting and pain with standing, while the patient does not report symptoms of: swelling, locking, catching, popping or grinding. The patient states that they are doing poorly. Current treatment includes: activity modification and Neurontin. The following medication has been used for pain control: Norco (5/325 q 5 hr and Gabapentin 600mg  tid Rx'd by Dr Nelva Bush .). The patient reports their current pain level to be 4-5 / 10. The patient presents today following EMG/NCV and physical therapy (did not help after 1 week, stopped due to B/P issues). The patient has not gotten any relief of their symptoms with Cortisone injections. Note for "Follow-up back": The patient has seen his PCP for chest pain and hypertension. His B/P meds have been increased. He reports his LBP is constant. It is always at least a 4/10 and goes up to 10/10 through out the day. He has burning in b/l feet. He reports intermittent leg pain of his posterior left leg. 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  Additional reasons for visit:  Transition into care is described as the following: The patient is transitioning into care from another physician (Dr Nelva Bush who did Nerve studies on 09/25/15) .   Allergies Penicillins   Family History  Rheumatoid Arthritis  father and  grandmother fathers side Osteoarthritis  grandmother fathers side First Degree Relatives   Social History  Tobacco use  former smoker Children  1 Current work status  working part time Engineer, agricultural (Currently)  no Drug/Alcohol Rehab (Previously)  no Marital status  married Pain Contract  no Illicit drug use  no  Medication History Norco (5-325MG  Tablet, 1 (one) Tablet Oral every 6-8 hours as needed for pain, Taken starting 09/26/2015) Active. Neurontin (300MG  Capsule, 2 (two) Oral three times daily, Taken starting 09/06/2015) Active. PARoxetine HCl (20MG  Tablet, Oral) Active. Meloxicam (15MG  Tablet, Oral) Active. Promethazine HCl (25MG  Tablet, Oral) Active. (prn) SUMAtriptan Succinate (100MG  Tablet, Oral) Active. (prn) Norvasc (10MG  Tablet, Oral) Active. (qd) Lisinopril-Hydrochlorothiazide (20-25MG  Tablet, Oral) Active. (qd) Medications Reconciled  Physical Exam  General General Appearance-Not in acute distress. Orientation-Oriented X3. Build & Nutrition-Well nourished and Well developed.  Integumentary General Characteristics Surgical Scars - no surgical scar evidence of previous lumbar surgery. Lumbar Spine-Skin examination of the lumbar spine is without deformity, skin lesions, lacerations or abrasions.  Abdomen Palpation/Percussion Palpation and Percussion of the abdomen reveal - Soft, Non Tender and No Rebound tenderness.  Peripheral Vascular Lower Extremity Palpation - Posterior tibial pulse - Bilateral - 2+. Dorsalis pedis pulse - Bilateral - 2+.  Neurologic Sensation Lower Extremity - Left - sensation is diminished in the lower extremity. Right - sensation is intact in the lower extremity. Reflexes Patellar Reflex - Bilateral - 2+. Achilles Reflex - Bilateral - 2+. Clonus - Bilateral - clonus not present. Hoffman's Sign - Bilateral - Hoffman's sign not  present. Testing Seated Straight Leg Raise - Bilateral - Seated straight  leg raise positive.  Musculoskeletal Spine/Ribs/Pelvis  Lumbosacral Spine: Inspection and Palpation - Tenderness - left lumbar paraspinals tender to palpation and right lumbar paraspinals tender to palpation. Strength and Tone: Strength - Hip Flexion - Bilateral - 5/5. Knee Extension - Bilateral - 5/5. Knee Flexion - Bilateral - 5/5. Ankle Dorsiflexion - Bilateral - 5/5. Ankle Plantarflexion - Bilateral - 5/5. Heel walk - Bilateral - able to heel walk without difficulty. Toe Walk - Bilateral - able to walk on toes without difficulty. Heel-Toe Walk - Bilateral - able to heel-toe walk without difficulty. ROM - Flexion - full range of motion. Extension - full range of motion. Left Lateral Bending - full range of motion. Right Lateral Bending - full range of motion. Right Rotation - full range of motion. Left Rotation - full range of motion. Pain - neither flexion or extension is more painful than the other. Lumbosacral Spine - Waddell's Signs - no Waddell's signs present. Lower Extremity Range of Motion - No true hip, knee or ankle pain with range of motion. Gait and Station - [employer] - no assistive devices.  Objective Transcription He returns today for a followup. He continues to have horrific back, buttock and left leg pain, but now he starts to get intermittent dysesthesias in the right leg. Positive left straight leg raise test. He continues to have weakness of the EHL, tibialis anterior, gastrocnemius on the left side. He continues to have progressive debilitating back, buttock and radicular left leg pain. No shortness of breath or chest pain. Abdomen is soft and nontender. Negative Babinski test. At this point in time, the patient, his wife and I have gone over his MRI and his clinical findings again. Despite injection therapy, physical therapy, activity modification, medications, his quality of life continues to deteriorate. He has tried and failed appropriate conservative management. He did  get his nerve conduction test on 09/25/2015, which was essentially normal. There was no evidence of peripheral neuropathy.  At this point, his MRI is consisting with a grade 2 spondylolisthesis at L5-S1 with neural compression especially on the left hand side. The patient's MRI findings and clinical findings correlate to one another and he has failed conservative management. At this point, since there is just as much leg pain as there is back pain, I would recommend doing a transforaminal lumbar interbody fusion. This allows for direct visualization of the nerve to ensure an adequate decompression. The risks include infection, bleeding, nerve damage, death, stroke, paralysis, failure to heal, need for further surgery, ongoing or worse pain, loss of bowel/bladder control, or nonunion. The patient does have an extra lumbar vertebrae and so care will be taken during surgery to ensure that we are truly at the appropriate level. Because he is a ""violent sleeper,"" I think the best option is an LSO brace. However, in order to control and provide stability at the L5-S1 level, we would have to add a thigh cuff. We will add the right thigh cuff since his left leg is painful neuropathic side. He will use this for at least 6 to 12 weeks. All other questions and concerns were addressed. We will start the insurance approval process. He will speak with Dr. Army Melia about medical clearance and I will make sure to forward our notes to Dr. Army Melia.  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 Patient is aware that despite all appropriate treatment that there pain and function could be the same, worse, or different. Posterior decompression/Fusion:Risks of surgery include infection, bleeding, nerve damage, death, stroke, paralysis, failure to heal, need for further surgery, ongoing or  worse pain, need for further surgery, CSF leak, loss of bowel or bladder, and recurrent disc herniation or stenosis which would necessitate need for further surgery. Non-union, hardware failure, adjacent segment disease and recurrent pain. Hardware breakage, mal-position requiring surgery to correct or remove.

## 2015-11-22 NOTE — Transfer of Care (Signed)
Immediate Anesthesia Transfer of Care Note  Patient: Dustin Henry  Procedure(s) Performed: Procedure(s): TLIF L5-S1 (N/A)  Patient Location: PACU  Anesthesia Type:General  Level of Consciousness: awake, alert , oriented and patient cooperative  Airway & Oxygen Therapy: Patient Spontanous Breathing and Patient connected to face mask oxygen  Post-op Assessment: Report given to RN and Post -op Vital signs reviewed and stable  Post vital signs: Reviewed and stable  Last Vitals:  Filed Vitals:   11/22/15 1011  BP: 164/94  Pulse: 97  Temp: 36.7 C  Resp: 20    Complications: No apparent anesthesia complications

## 2015-11-22 NOTE — Op Note (Signed)
NAMEFONG, MARCOS            ACCOUNT NO.:  [id number]  MEDICAL RECORD NO.:  [id number]  LOCATION:  5C15C                        FACILITY:  [facility]  PHYSICIAN:  Huston Stonehocker D. Rolena Infante, M.D. DATE OF BIRTH:  1971-08-11  DATE OF PROCEDURE:  11/22/2015 DATE OF DISCHARGE:                              OPERATIVE REPORT   FIRST ASSISTANT:  Ronette Deter, PA.  PREOPERATIVE DIAGNOSIS:  L5-S1 grade 2 spondylolisthesis with radicular left leg pain.  POSTOPERATIVE DIAGNOSIS:  L5-S1 grade 2 spondylolisthesis with radicular left leg pain.  OPERATIVE PROCEDURE:  Transforaminal lumbar interbody fusion, L5-S1.  It should be noted that the patient does have transitional anatomy on the patient's one preoperative MRI, it was noted that he had an L4-5 slip on another.  It was noted as L5-S1.  Essentially it was the second to the last full disk space.  I did compare his preoperative imaging studies to the intraoperative fluoro and confirmed that I was at the appropriate level.  I did use the original numerical setting, which would put this at L5-S1.  However, it should be noted that it could also be referred to his L4-5 depending upon whether or not the lumbarized S1 was counted.  HISTORY:  This is a very pleasant 44 year old gentleman who is having severe progressive debilitating left leg pain and horrific back pain. Attempts at conservative management have failed to alleviate his symptoms.  Because of the severity, we elected to proceed with the aforementioned procedure.  All appropriate risks, benefits, and alternatives were discussed with the patient and consent was obtained.  OPERATIVE NOTE:  The patient was brought to the operating room and placed supine on the operating table.  After successful induction of general anesthesia and endotracheal intubation, TEDs, SCDs and Foley were inserted.  The neuromonitoring representative then placed all appropriate needles for intraoperative SSEP and  free-running EMG monitoring.  The patient was turned prone onto the Wilson frame and the back was prepped and draped in the standard fashion.  All bony prominences were well padded.  Time-out was taken confirming the patient, procedure and all other pertinent important data.  On the right side of the spine, I identified the lateral border of the L5 and S1 pedicles and then made a small incision on the lateral border.  I advanced my Jamshidi down to the lateral aspect of the facet complex at the junction of the transverse processes and confirmed that I was at the appropriate level.  I advanced this using the AP plane as well as neuromonitoring.  Once I was down to the posterior aspect of the vertebral body, I confirmed this on the lateral and then advanced it into the vertebral body.  I placed my guidepin through the Jamshidi needle.  I repeated this at the next level.  Once both pedicles were cannulated, I then tapped and then placed 45-mm length screws at both levels.  These were 6.5-mm diameter NuVasive pedicle screws.  I stimulated both screws and there was no evidence of breach.  With the right side complete, I then went to the left.  I made a Wiltse incision, centered over the lateral side of the 5-1 disks, sharply dissected down to the deep fascia.  Using the same technique, I had used on the contralateral side, cannulated both pedicles.  I then placed the pedicle screws, which were attached to the retracting blades.  I then established the retraction and then mobilized the paraspinal muscles medially and placed my medial retraction blade.  I could now see the L5 lamina, the L5-S1 facet complex and the L5 pars.  I again confirmed satisfactory positioning in both planes.  Generous laminotomy was performed using 2 and 3-mm Kerrison punch.  I then used an osteotome to completely resect the inferior L5 facet complex.  Once this was removed, I then used the Doland 4 to dissect through  the ligamentum flavum.  I then created my space between the thecal sac and ligamentum flavum and excised the ligamentum flavum exposing the lateral aspect of the thecal sac.  I continued my dissection into the lateral recess until I could visualize the traversing S1 nerve root.  I then removed the medial overhanging osteophyte from the superior facet until I could palpate the medial border of the pedicle.  I then continued my dissection superiorly, resecting the entire L5 pars and unroofing the L5 nerve root.  At this point, I had excellent visualization of the posterolateral aspect of the disk.  I could easily palpate superiorly in the lateral recess and inferiorly and the L5 nerve root was completely freed.  I could palpate the inferior aspect of the L5 pedicle and the medial aspect and there was no evidence of breach.  Neuro patties were placed to protect the L5 and S1 nerve roots and I retracted the thecal sac.  An annulotomy was performed with a 15-blade scalpel and then using a combination of pituitary rongeurs, side-cutting curettes, regular curettes, pituitary rongeurs, I removed all of the disk material.  Care was taken not to go too far anterior and risk injury to the intraabdominal vessels.  Once I had bleeding subchondral bone, I then measured and elected to place the size 11 Titan titanium cage, extra long.  This cage was obtained, packed with the bone that I had harvested from the decompression and mixed with DBX.  I irrigated the wound out copiously with normal saline and then malleted the cage into the appropriate position.  It should be noted that prior to putting the cage in, I did place my Conform allograft sheet along the anterior aspect of the annulus.  I then placed the cage in and then kicked it over to the contralateral side and positioned it in a horizontal position in the anterior third of the disk space.  At this point, I was pleased with its position, it was  confirmed at an appropriate depth.  I then took down the kyphosis that was built into the Robinhood frame and then applied the polyaxial heads to the screws.  I removed the retracting device and then measured and placed a size 45-mm length rod.  The top-locking nuts were then secured into position and torqued off according to manufacture's standards.  On the contralateral side, I placed the 40-mm rod and again the top-locking nuts were torqued off according to manufacture's standards.  At this point, final x-rays were taken.  Hardware was in good position.  I irrigated all the wounds copiously with normal saline and made sure I had hemostasis using bipolar electrocautery and FloSeal. I then closed the deep fascia of each wound with #1 Vicryl sutures, superficial with 2-0 Vicryl sutures and a 3-0 Monocryl for the skin. Steri-Strips and dry dressing were  applied.  The patient was then extubated, transferred to the PACU without incident.  At the end of the case, all needle and sponge counts were correct.  First assistant was [employer], my PA.     Layann Bluett D. Rolena Infante, M.D.     DDB/MEDQ  D:  11/22/2015  T:  11/22/2015  Job:  [id number]

## 2015-11-22 NOTE — Anesthesia Procedure Notes (Signed)
Procedure Name: Intubation Date/Time: 11/22/2015 1:55 PM Performed by: Collier Bullock Pre-anesthesia Checklist: Patient identified, Emergency Drugs available, Suction available and Patient being monitored Patient Re-evaluated:Patient Re-evaluated prior to inductionOxygen Delivery Method: Circle system utilized Preoxygenation: Pre-oxygenation with 100% oxygen Ventilation: Mask ventilation without difficulty Laryngoscope Size: Glidescope and 4 Grade View: Grade I Tube type: Oral Tube size: 7.5 mm Number of attempts: 1 Airway Equipment and Method: Video-laryngoscopy Placement Confirmation: ETT inserted through vocal cords under direct vision,  positive ETCO2 and breath sounds checked- equal and bilateral Secured at: 25 cm Tube secured with: Tape (wet proof tape, tegaderm) Dental Injury: Teeth and Oropharynx as per pre-operative assessment  Difficulty Due To: Difficulty was anticipated

## 2015-11-22 NOTE — Anesthesia Preprocedure Evaluation (Addendum)
Anesthesia Evaluation  Patient identified by MRN, date of birth, ID band Patient awake    Reviewed: Allergy & Precautions, H&P , NPO status , Patient's Chart, lab work & pertinent test results  History of Anesthesia Complications (+) DIFFICULT AIRWAY  Airway Mallampati: II  TM Distance: >3 FB Neck ROM: Full    Dental no notable dental hx. (+) Teeth Intact, Dental Advisory Given   Pulmonary neg pulmonary ROS, former smoker,    Pulmonary exam normal breath sounds clear to auscultation       Cardiovascular hypertension, Pt. on medications  Rhythm:Regular Rate:Normal     Neuro/Psych  Headaches, Anxiety Depression negative psych ROS   GI/Hepatic negative GI ROS, Neg liver ROS,   Endo/Other  negative endocrine ROS  Renal/GU negative Renal ROS  negative genitourinary   Musculoskeletal   Abdominal   Peds  Hematology negative hematology ROS (+)   Anesthesia Other Findings   Reproductive/Obstetrics negative OB ROS                            Anesthesia Physical Anesthesia Plan  ASA: II  Anesthesia Plan: General   Post-op Pain Management:    Induction: Intravenous  Airway Management Planned: Oral ETT and Video Laryngoscope Planned  Additional Equipment:   Intra-op Plan:   Post-operative Plan: Extubation in OR  Informed Consent: I have reviewed the patients History and Physical, chart, labs and discussed the procedure including the risks, benefits and alternatives for the proposed anesthesia with the patient or authorized representative who has indicated his/her understanding and acceptance.   Dental advisory given  Plan Discussed with: CRNA  Anesthesia Plan Comments:        Anesthesia Quick Evaluation

## 2015-11-22 NOTE — Brief Op Note (Signed)
11/22/2015  4:49 PM  PATIENT:  Dustin Henry  44 y.o. male  PRE-OPERATIVE DIAGNOSIS:  GRADE 2 SLIP WITH KNP AND LEFT LEG PAIN  POST-OPERATIVE DIAGNOSIS:  GRADE 2 SLIP WITH KNP AND LEFT LEG PAIN  PROCEDURE:  Procedure(s): TLIF L5-S1 (N/A)  SURGEON:  Surgeon(s) and Role:    * Melina Schools, MD - Primary  PHYSICIAN ASSISTANT:   ASSISTANTS: Carmen Mayo   ANESTHESIA:   general  EBL:  Total I/O In: 2000 [I.V.:2000] Out: 450 [Urine:200; Blood:250]  BLOOD ADMINISTERED:none  DRAINS: none   LOCAL MEDICATIONS USED:  MARCAINE     SPECIMEN:  No Specimen and Excision  DISPOSITION OF SPECIMEN:  N/A  COUNTS:  YES  TOURNIQUET:  * No tourniquets in log *  DICTATION: .Other Dictation: Dictation Number [id number]  PLAN OF CARE: Admit to inpatient   PATIENT DISPOSITION:  PACU - hemodynamically stable.

## 2015-11-23 ENCOUNTER — Inpatient Hospital Stay (HOSPITAL_COMMUNITY): Payer: 59

## 2015-11-23 ENCOUNTER — Encounter (HOSPITAL_COMMUNITY): Payer: Self-pay | Admitting: Orthopedic Surgery

## 2015-11-23 MED ORDER — MAGNESIUM CITRATE PO SOLN
1.0000 | Freq: Once | ORAL | Status: AC
  Administered 2015-11-23: 1 via ORAL
  Filled 2015-11-23: qty 296

## 2015-11-23 MED ORDER — OXYCODONE HCL 5 MG PO TABS
15.0000 mg | ORAL_TABLET | ORAL | Status: DC | PRN
  Administered 2015-11-23 – 2015-11-24 (×6): 15 mg via ORAL
  Filled 2015-11-23 (×7): qty 3

## 2015-11-23 MED ORDER — HYDROMORPHONE HCL 1 MG/ML IJ SOLN
1.0000 mg | INTRAMUSCULAR | Status: DC | PRN
  Administered 2015-11-23 – 2015-11-24 (×7): 1 mg via INTRAVENOUS
  Filled 2015-11-23 (×7): qty 1

## 2015-11-23 NOTE — Progress Notes (Signed)
PT Cancellation Note  Patient Details Name: NASIER CLOSS MRN: [id number] DOB: 13-Jun-1971   Cancelled Treatment:    Reason Eval/Treat Not Completed: Pain limiting ability to participate.   Encouraged pt to attempt OOB as he has had max pain meds and muscle relaxers within the past hour. He states when down in xray "they dropped me onto my back" and he's having terrible pain. He states this occurred after the xray was done. "I can't do this right now." Offered next time he will have optimal pain meds will be ~14:00 (per RN) and can try to return at that time.  Patient reports he has been up walking in room and to bathroom with night shift nurse and later his wife. His back brace was not donned at either time. Educated he must have brace on whenever he sits up. Educated on back precautions as pt repeatedly trying to pull up to sit from supine (bending) to show he can't tolerate moving right now. Wife present throughout.   Teresa Nicodemus 11/23/2015, 11:10 AM  Pager [phone]

## 2015-11-23 NOTE — Progress Notes (Signed)
Occupational Therapy Evaluation Patient Details Name: Dustin Henry MRN: [id number] DOB: 19-Jan-1971 Today's Date: 11/23/2015    History of Present Illness 44 y.o. male s/p TLIF L5-S1 due to worsening low back and LLE pain, numbness, weakness.    Clinical Impression   PTA, pt was independent with ADLs and functional mobility. Pt currently presents with acute back and LLE pain and required min-min guard assist for transfers and ambulation. Reviewed back precautions and need for AE for LB ADLs. Pt will benefit from acute skilled OT to increase independence and safety with ADLs and functional mobility to allow safe discharge home with assistance from family. No OT follow up recommendations at this time and no DME needs.     Follow Up Recommendations  No OT follow up;Supervision/Assistance - 24 hour    Equipment Recommendations  None recommended by OT    Recommendations for Other Services       Precautions / Restrictions Precautions Precautions: Back;Fall Precaution Booklet Issued: No Precaution Comments: Pt able to recall 2/3 back precautions at end of session. Required Braces or Orthoses: Spinal Brace Spinal Brace: Thoracolumbosacral orthotic;Applied in sitting position (with thigh cuff applied in standing) Restrictions Weight Bearing Restrictions: No      Mobility Bed Mobility Overal bed mobility: Needs Assistance Bed Mobility: Rolling;Sidelying to Sit Rolling: Min guard Sidelying to sit: Min guard       General bed mobility comments: HOB flat, use of bedrails. Verbal cues for log roll technique and to move slower.  Transfers Overall transfer level: Needs assistance Equipment used: Rolling walker (2 wheeled) Transfers: Sit to/from Stand Sit to Stand: Min guard         General transfer comment: Min assist for boost to stand. Verbal cues for safe hand placement on RW and surfaces before sitting.    Balance Overall balance assessment: Needs  assistance Sitting-balance support: No upper extremity supported;Feet supported Sitting balance-Leahy Scale: Fair Sitting balance - Comments: Cannot tolerate challenge due to increased pain   Standing balance support: Bilateral upper extremity supported;During functional activity Standing balance-Leahy Scale: Poor Standing balance comment: Heavy reliance on RW for UE support                            ADL Overall ADL's : Needs assistance/impaired     Grooming: Wash/dry face;Set up;Sitting           Upper Body Dressing : Maximal assistance;Sitting;Standing Upper Body Dressing Details (indicate cue type and reason): To don TLSO w/ thigh cuff     Toilet Transfer: Minimal assistance;Cueing for safety;Ambulation;Comfort height toilet;RW Armed forces technical officer Details (indicate cue type and reason): Simulated transfer to chair - pt did not need to void Toileting- Clothing Manipulation and Hygiene: Maximal assistance;Sitting/lateral lean       Functional mobility during ADLs: Min guard;Cueing for safety;Rolling walker General ADL Comments: Pt with increased pain upon standing and ambulating. Verbal cues for safe hand placement on surfaces during transfers. Discussed need for AE due to TLSO w/ thigh cuff.      Vision Vision Assessment?: No apparent visual deficits   Perception     Praxis      Pertinent Vitals/Pain Pain Assessment: 0-10 Pain Score: 5  Pain Location: back and L leg Pain Descriptors / Indicators: Radiating;Numbness;Sore;Operative site guarding Pain Intervention(s): Limited activity within patient's tolerance;Monitored during session;Repositioned;Premedicated before session     Hand Dominance Right   Extremity/Trunk Assessment Upper Extremity Assessment Upper Extremity Assessment: LUE deficits/detail LUE  Deficits / Details: Slight weakness detected in grip strength; unable to assess other muscle groups due to back precautions and increased pain LUE:  Unable to fully assess due to pain   Lower Extremity Assessment Lower Extremity Assessment: Defer to PT evaluation   Cervical / Trunk Assessment Cervical / Trunk Assessment: Normal   Communication Communication Communication: No difficulties   Cognition Arousal/Alertness: Awake/alert Behavior During Therapy: WFL for tasks assessed/performed Overall Cognitive Status: Within Functional Limits for tasks assessed       Memory: Decreased recall of precautions             General Comments       Exercises       Shoulder Instructions      Home Living Family/patient expects to be discharged to:: Private residence Living Arrangements: Spouse/significant other;Children Available Help at Discharge: Family;Available 24 hours/day Type of Home: House Home Access: Stairs to enter [employer] of Steps: 2 Entrance Stairs-Rails: Right Home Layout: One level     Bathroom Shower/Tub: Teacher, early years/pre: Standard     Home Equipment: Clinical cytogeneticist - 2 wheels;Bedside commode   Additional Comments: Wife had back surgery last May and nervous about how much assist she can provide. 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    OT Diagnosis: Acute pain   OT Problem List: Decreased strength;Decreased range of motion;Decreased activity tolerance;Impaired balance (sitting and/or standing);Decreased coordination;Decreased safety awareness;Decreased knowledge of use of DME or AE;Decreased knowledge of precautions;Pain   OT Treatment/Interventions: Self-care/ADL training;Therapeutic exercise;DME and/or AE instruction;Therapeutic activities;Patient/family education;Balance training    OT Goals(Current goals can be found in the care plan section) Acute Rehab OT Goals Patient Stated Goal: to decrease pain OT Goal  Formulation: With patient Time For Goal Achievement: 12/07/15 Potential to Achieve Goals: Good ADL Goals Pt Will Perform Grooming: with modified independence;standing Pt Will Perform Lower Body Bathing: with supervision;with adaptive equipment;sitting/lateral leans;sit to/from stand Pt Will Perform Lower Body Dressing: with supervision;with adaptive equipment;sitting/lateral leans;sit to/from stand Pt Will Transfer to Toilet: with supervision;ambulating;bedside commode Pt Will Perform Toileting - Clothing Manipulation and hygiene: with supervision;with adaptive equipment;sitting/lateral leans;sit to/from stand Pt Will Perform Tub/Shower Transfer: Tub transfer;with supervision;with caregiver independent in assisting;ambulating;shower seat;rolling walker Additional ADL Goal #1: Pt/wife will independently don/doff back brace to increase safety with ADLs and mobility. Additional ADL Goal #2: Pt will demonstrate understanding of 3/3 back precautions to increase safety with ADLs and mobility.  OT Frequency: Min 2X/week   Barriers to D/C:            Co-evaluation PT/OT/SLP Co-Evaluation/Treatment: Yes Reason for Co-Treatment: Complexity of the patient's impairments (multi-system involvement);For patient/therapist safety   OT goals addressed during session: Proper use of Adaptive equipment and DME;Strengthening/ROM      End of Session Equipment Utilized During Treatment: Gait belt;Rolling walker;Back brace Nurse Communication: Mobility status;Precautions;Other (comment) (Pt up in chair for 30 minutes)  Activity Tolerance: No increased pain Patient left: in chair;with call bell/phone within reach;with chair alarm set;with family/visitor present   Time: 1401-1440 OT Time Calculation (min): 39 min Charges:  OT General Charges $OT Visit: 1 Procedure OT Evaluation $Initial OT Evaluation Tier I: 1 Procedure OT Treatments $Self Care/Home Management : 23-37 mins G-Codes:    Redmond Baseman,  OTR/L 11/23/2015, 3:19 PM

## 2015-11-23 NOTE — Progress Notes (Cosign Needed)
Patient ID: Dustin Henry, male   DOB: May 12, 1971, 44 y.o.   MRN: [id number]    Subjective: 1 Day Post-Op Procedure(s) (LRB): TLIF L5-S1 (N/A) Patient reports pain as 10 on 0-10 scale.   Denies CP or SOB.  Voiding without difficulty. Positive flatus. Objective: Vital signs in last 24 hours: Temp:  [98.1 F (36.7 C)-99.3 F (37.4 C)] 98.2 F (36.8 C) (12/15 0624) Pulse Rate:  [97-127] 101 (12/15 0624) Resp:  [10-20] 18 (12/15 0624) BP: (107-164)/(66-94) 118/77 mmHg (12/15 0624) SpO2:  [90 %-100 %] 94 % (12/15 0624) Weight:  [91.899 kg (202 lb 9.6 oz)] 91.899 kg (202 lb 9.6 oz) (12/14 1011)  Intake/Output from previous day: 12/14 0701 - 12/15 0700 In: 3033 [P.O.:480; I.V.:2103] Out: 1550 [Urine:1300; Blood:250] Intake/Output this shift:    Labs: No results for input(s): HGB in the last 72 hours. No results for input(s): WBC, RBC, HCT, PLT in the last 72 hours. No results for input(s): NA, K, CL, CO2, BUN, CREATININE, GLUCOSE, CALCIUM in the last 72 hours. No results for input(s): LABPT, INR in the last 72 hours.  Physical Exam: Neurologically intact Sensation intact distally Dorsiflexion/Plantar flexion intact Incision: dressing C/D/I Compartment soft  Assessment/Plan: 1 Day Post-Op Procedure(s) (LRB): TLIF L5-S1 (N/A) Advance diet Up with therapy  Increased pain medication to Oxy 15 and Dilaudid for severe pain Ordered mag citrate for opioid induced constipation   Dustin Henry, Dustin Henry for Dr. Melina Schools [facility] [phone] 11/23/2015, 7:29 AM

## 2015-11-23 NOTE — Care Management Note (Signed)
Case Management Note  Patient Details  Name: Dustin Henry MRN: [id number] Date of Birth: Apr 03, 1971  Subjective/Objective:                    Action/Plan: Patient was admitted for a TLIF. Lives at home with spouse. Will follow for discharge needs pending PT/OT evals and physician orders.  Expected Discharge Date:                  Expected Discharge Plan:     In-House Referral:     Discharge planning Services     Post Acute Care Choice:    Choice offered to:     DME Arranged:    DME Agency:     HH Arranged:    [place] Agency:     Status of Service:     Medicare Important Message Given:    Date Medicare IM Given:    Medicare IM give by:    Date Additional Medicare IM Given:    Additional Medicare Important Message give by:     If discussed at [place] of Stay Meetings, dates discussed:    Additional Comments:  Rolm Baptise, RN 11/23/2015, 10:35 AM [phone]

## 2015-11-23 NOTE — Evaluation (Signed)
Physical Therapy Evaluation Patient Details Name: Dustin Henry MRN: [id number] DOB: October 27, 1971 Today's Date: 11/23/2015   History of Present Illness  44 y.o. male s/p TLIF L5-S1 due to worsening low back and LLE pain, numbness, weakness.   Clinical Impression  Patient is s/p above surgery resulting in the deficits listed below (see PT Problem List). Patient limited by pain and will required continued education in use of DME.  Patient will benefit from skilled PT to increase their independence and safety with mobility (while adhering to their precautions) to allow discharge to the venue listed below.     Follow Up Recommendations No PT follow up;Supervision for mobility/OOB    Equipment Recommendations  None recommended by PT    Recommendations for Other Services       Precautions / Restrictions Precautions Precautions: Back;Fall Precaution Booklet Issued: Yes (comment) Precaution Comments: Pt able to recall 2/3 back precautions at end of session. Required Braces or Orthoses: Spinal Brace Spinal Brace: Thoracolumbosacral orthotic;Applied in sitting position (with thigh cuff applied in standing) Restrictions Weight Bearing Restrictions: No      Mobility  Bed Mobility Overal bed mobility: Needs Assistance Bed Mobility: Rolling;Sidelying to Sit Rolling: Min guard Sidelying to sit: Min guard       General bed mobility comments: HOB flat, use of bedrails. Verbal cues for log roll technique and to move slower.  Transfers Overall transfer level: Needs assistance Equipment used: Rolling walker (2 wheeled) Transfers: Sit to/from Stand Sit to Stand: Min assist         General transfer comment: Min assist for transitioning hands to RW to stand. Verbal cues for safe hand placement on RW and surfaces before sitting.  Ambulation/Gait Ambulation/Gait assistance: Min assist Ambulation Distance (Feet): 70 Feet (4 standing rest breaks after 35 ft) Assistive device: Rolling  walker (2 wheeled) Gait Pattern/deviations: Step-to pattern;Decreased stride length (Rt hip hike to advance RLE)   Gait velocity interpretation: Below normal speed for age/gender General Gait Details: Pt with TLSO with hip extension piece (locked in extension with walking). Educated in lead with RLE and step-to pattern to decrease hip extension/lumbar movement. Required frequent cues as he would revert to step-through  Stairs            Wheelchair Mobility    Modified Rankin (Stroke Patients Only)       Balance Overall balance assessment: Needs assistance Sitting-balance support: No upper extremity supported;Feet supported Sitting balance-Leahy Scale: Fair Sitting balance - Comments: Cannot tolerate challenge due to increased pain   Standing balance support: Bilateral upper extremity supported Standing balance-Leahy Scale: Poor Standing balance comment: Heavy reliance on RW via UEs                             Pertinent Vitals/Pain Pain Assessment: 0-10 Pain Score: 5  Pain Location: back, then progressing down LLE Pain Descriptors / Indicators: Operative site guarding;Radiating Pain Intervention(s): Limited activity within patient's tolerance;Monitored during session;Premedicated before session;Repositioned    Home Living Family/patient expects to be discharged to:: Private residence Living Arrangements: Spouse/significant other;Children Available Help at Discharge: Family;Available 24 hours/day Type of Home: House Home Access: Stairs to enter Entrance Stairs-Rails: Right Entrance Stairs-Number of Steps: 2 Home Layout: One level Home Equipment: Clinical cytogeneticist - 2 wheels;Bedside commode Additional Comments: Wife had back surgery last May and nervous about how much assist she can provide. 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      Hand Dominance   Dominant Hand: Right    Extremity/Trunk Assessment   Upper Extremity Assessment: Defer to OT evaluation          Lower Extremity Assessment: Overall WFL for tasks assessed;LLE deficits/detail (bil toe ext and DF 5/5; decr sensation bil feet)      Cervical / Trunk Assessment: Normal  Communication   Communication: No difficulties  Cognition Arousal/Alertness: Awake/alert Behavior During Therapy: WFL for tasks assessed/performed Overall Cognitive Status: Within Functional Limits for tasks assessed       Memory: Decreased recall of precautions (was educated by PT on initial visit @1055 )              General Comments General comments (skin integrity, edema, etc.): Attempted PT eval 10:54-11:06 this morning. At that time educated in back precautions (espedially due to pt not adhering while in bed) and proper use of brace (had been getting up to bathroom without brace).     Exercises        Assessment/Plan    PT Assessment Patient needs continued PT services  PT Diagnosis Difficulty walking;Acute pain   PT Problem List Decreased range of motion;Decreased activity tolerance;Decreased balance;Decreased mobility;Decreased knowledge of use of DME;Decreased knowledge of precautions;Impaired sensation;Pain  PT Treatment Interventions DME instruction;Gait training;Stair training;Functional mobility training;Therapeutic activities;Patient/family education   PT Goals (Current goals can be found in the Care Plan section) Acute Rehab PT Goals Patient Stated Goal: to decrease pain PT Goal Formulation: With patient Time For Goal Achievement: 11/27/15 Potential to Achieve Goals: Good    Frequency Min 5X/week   Barriers to discharge        Co-evaluation PT/OT/SLP Co-Evaluation/Treatment: Yes Reason for Co-Treatment: Other (comment) (due to pt's severe pain and limited willingness to move) PT goals addressed during session: Mobility/safety with  mobility;Proper use of DME OT goals addressed during session: Proper use of Adaptive equipment and DME;Strengthening/ROM       End of Session Equipment Utilized During Treatment: Gait belt;Back brace Activity Tolerance: Patient limited by pain Patient left: in chair;with call bell/phone within reach;with chair alarm set;with family/visitor present;Other (comment) (OT present) Nurse Communication: Mobility status;Other (comment) (up without brace earlier today x 2)         Time: [id number] (time adjusted to include a.m. instruction)-1435 PT Time Calculation (min) (ACUTE ONLY): 46 min   Charges:   PT Evaluation $Initial PT Evaluation Tier I: 1 Procedure PT Treatments $Gait Training: 8-22 mins   PT G Codes:        Dustin Henry December 12, 2015, 4:00 PM Pager [phone]

## 2015-11-23 NOTE — Progress Notes (Signed)
Pt OOB, sitting on edge of bed several minutes, ambulated around room approx 25 ft, back to bed w/o incident.

## 2015-11-24 MED ORDER — OXYCODONE-ACETAMINOPHEN 10-325 MG PO TABS: 1.0000 | ORAL_TABLET | ORAL | Status: DC | PRN

## 2015-11-24 MED ORDER — METHOCARBAMOL 500 MG PO TABS: 500.0000 mg | ORAL_TABLET | Freq: Three times a day (TID) | ORAL | Status: DC | PRN

## 2015-11-24 MED ORDER — ONDANSETRON HCL 4 MG PO TABS: 4.0000 mg | ORAL_TABLET | Freq: Three times a day (TID) | ORAL | Status: DC | PRN

## 2015-11-24 MED ORDER — GABAPENTIN 300 MG PO CAPS: ORAL_CAPSULE | ORAL | Status: DC

## 2015-11-24 NOTE — Progress Notes (Signed)
    Subjective: Procedure(s) (LRB): TLIF L5-S1 (N/A) 2 Days Post-Op  Patient reports pain as 3 on 0-10 scale.  Reports decreased leg pain reports incisional back pain   Positive void Negative bowel movement Positive flatus Negative chest pain or shortness of breath  Objective: Vital signs in last 24 hours: Temp:  [98.1 F (36.7 C)-98.3 F (36.8 C)] 98.2 F (36.8 C) (12/16 0540) Pulse Rate:  [85-113] 85 (12/16 0540) Resp:  [18-20] 18 (12/16 0540) BP: (108-143)/(64-91) 108/64 mmHg (12/16 0540) SpO2:  [91 %-94 %] 92 % (12/16 0540)  Intake/Output from previous day:    Labs: No results for input(s): WBC, RBC, HCT, PLT in the last 72 hours. No results for input(s): NA, K, CL, CO2, BUN, CREATININE, GLUCOSE, CALCIUM in the last 72 hours. No results for input(s): LABPT, INR in the last 72 hours.  Physical Exam: Neurologically intact Neurovascular intact Incision: dressing C/D/I Compartment soft  Assessment/Plan: Patient stable  xrays satisfactory Continue mobilization with physical therapy Continue care  Advance diet Up with therapy Discharge home with home health  If positive BM and cleared by PT then plan on d/c today  Melina Schools, MD [place] [phone]

## 2015-11-24 NOTE — Progress Notes (Signed)
Pt discharged from hospital per order from MD. Pt educated on discharge instructions and handed prescriptions to be filled. Pt verbalized understanding of instructions. All questions and concerns were addressed. Pt exited hospital via wheelchair. IV removed before discharge.

## 2015-11-24 NOTE — Progress Notes (Signed)
Occupational Therapy Treatment Patient Details Name: Dustin Henry MRN: [id number] DOB: 09/09/1971 Today's Date: 11/24/2015    History of present illness 44 y.o. male s/p TLIF L5-S1 due to worsening low back and LLE pain, numbness, weakness.    OT comments  Pt making good progress toward OT goals. Educated on use of AE for increased independence with LB ADLs; pt able to return demonstrate understanding and has no further questions or concerns regarding use of AE. Educated on use of toilet tongs for toilet hygiene. Discussed managing LB clothing around brace; pt verbalized understanding. Pt demonstrated good log roll technique for bed mobility and is able to recall 3/3 back precautions. Pt and wife with no further questions or concerns for OT at this time. Will continue to follow acutely.    Follow Up Recommendations  No OT follow up;Supervision/Assistance - 24 hour    Equipment Recommendations  None recommended by OT    Recommendations for Other Services      Precautions / Restrictions Precautions Precautions: Back;Fall Precaution Comments: Pt able to recall 3/3 back precautions Required Braces or Orthoses: Spinal Brace Spinal Brace: Thoracolumbosacral orthotic;Applied in sitting position;Other (comment) (with thigh cuff applied in standing) Restrictions Weight Bearing Restrictions: No       Mobility Bed Mobility Overal bed mobility: Needs Assistance Bed Mobility: Rolling;Sidelying to Sit Rolling: Supervision Sidelying to sit: Supervision       General bed mobility comments: Good log roll technique. No use of bed rails and no VCs needed.  Transfers                      Balance                                   ADL Overall ADL's : Needs assistance/impaired               Lower Body Bathing Details (indicate cue type and reason): Educated on use of long handled sponge; pt verbalized understanding.   Upper Body Dressing Details  (indicate cue type and reason): Wife reports she is able to independently assist with donning brace Lower Body Dressing: Supervision/safety;Sit to/from stand Lower Body Dressing Details (indicate cue type and reason): Educated on use of long handled shoe horn, reacher, and sock aide; pt able to verbalize use of shoe horn, return demonstrated use of reacher and sock aide for LB dressing. Discussed managing clothing with brace applied for toileting; wearing brace under clothing or managing clothing around brace applied over clothing.        Toileting - Clothing Manipulation Details (indicate cue type and reason): Educated on use of toilet tongs to maintain back precautions during toilet hygiene; pt and wife verbalize understanding.     Functional mobility during ADLs: Min guard;Rolling walker General ADL Comments: Pts wife present for OT session. Educated on home safety, compensatory strategies for ADLs, need for supervision for safety during ADLs and mobility; pt verbalized understanding.      Vision                     Perception     Praxis      Cognition   Behavior During Therapy: [facility] for tasks assessed/performed Overall Cognitive Status: Within Functional Limits for tasks assessed                       Extremity/Trunk Assessment  Exercises     Shoulder Instructions       General Comments      Pertinent Vitals/ Pain       Pain Assessment: Faces Faces Pain Scale: Hurts even more Pain Location: L leg and back Pain Descriptors / Indicators: Aching;Sore Pain Intervention(s): Limited activity within patient's tolerance;Monitored during session;Patient requesting pain meds-RN notified  Home Living                                          Prior Functioning/Environment              Frequency Min 2X/week     Progress Toward Goals  OT Goals(current goals can now be found in the care plan section)  Progress towards  OT goals: Progressing toward goals  Acute Rehab OT Goals Patient Stated Goal: to go home today OT Goal Formulation: With patient  Plan Discharge plan remains appropriate    Co-evaluation                 End of Session     Activity Tolerance Patient tolerated treatment well   Patient Left with call bell/phone within reach;with family/visitor present;Other (comment) (sitting EOB)   Nurse Communication Other (comment);Patient requests pain meds (pt ready for d/c from OT standpoint)        Time: [id number] OT Time Calculation (min): 23 min  Charges: OT General Charges $OT Visit: 1 Procedure OT Treatments $Self Care/Home Management : 23-37 mins  Binnie Kand M.S., OTR/L Pager: [phone]  11/24/2015, 2:41 PM

## 2015-11-24 NOTE — Progress Notes (Signed)
Physical Therapy Treatment Patient Details Name: Dustin Henry MRN: [id number] DOB: [birth year] Today's Date: 11/24/2015    History of Present Illness 44 y.o. male s/p TLIF L5-S1 due to worsening low back and LLE pain, numbness, weakness.     PT Comments    Pt moving much better than yesterday.  Able to ambulate 150' with 2 rest breaks.  Wife present throughout session and both educated on safe mobility and use of brace.  Follow Up Recommendations  No PT follow up;Supervision for mobility/OOB     Equipment Recommendations  None recommended by PT    Recommendations for Other Services       Precautions / Restrictions Precautions Precautions: Back;Fall Precaution Comments: Recalled 3/3 back precautions Required Braces or Orthoses: Spinal Brace Spinal Brace: Thoracolumbosacral orthotic;Applied in sitting position;Other (comment) (with thigh cuff applied in standing)    Mobility  Bed Mobility   Bed Mobility: Rolling;Sidelying to Sit Rolling: Supervision         General bed mobility comments: cues for technique  Transfers Overall transfer level: Modified independent Equipment used: Rolling walker (2 wheeled) Transfers: Sit to/from Stand Sit to Stand: Modified independent (Device/Increase time)         General transfer comment: good technique standing from recliner and from bed.  Wife donned brace on EOB with cueing.  Ambulation/Gait Ambulation/Gait assistance: Min guard Ambulation Distance (Feet): 150 Feet Assistive device: Rolling walker (2 wheeled) Gait Pattern/deviations: Step-to pattern;Decreased stride length     General Gait Details: Pt with TLSO with hip extension piece with good recall from education provided during yesterday's gait session.  2 standing rest breaks.  Pt c/o L LE fatigue and pain 3/10.   Stairs Stairs:  (deferred due to fatigue and pain)          Wheelchair Mobility    Modified Rankin (Stroke Patients Only)        Balance     Sitting balance-Leahy Scale: Good     Standing balance support: During functional activity Standing balance-Leahy Scale: Fair                      Cognition Arousal/Alertness: Awake/alert Behavior During Therapy: WFL for tasks assessed/performed Overall Cognitive Status: Within Functional Limits for tasks assessed                      Exercises      General Comments General comments (skin integrity, edema, etc.): Pt declined stair training.  Reviewed technique with pt and wife.  Pt has adequate LE ROM to manage stairs and wife knowledgeable in ways to assist.      Pertinent Vitals/Pain Pain Assessment: 0-10 Pain Score: 3  Pain Location: L leg, back Pain Descriptors / Indicators: Aching;Operative site guarding Pain Intervention(s): Monitored during session;Repositioned    Home Living                      Prior Function            PT Goals (current goals can now be found in the care plan section) Acute Rehab PT Goals Patient Stated Goal: to decrease pain PT Goal Formulation: With patient Time For Goal Achievement: 11/27/15 Potential to Achieve Goals: Good Progress towards PT goals: Progressing toward goals    Frequency  Min 5X/week    PT Plan Current plan remains appropriate    Co-evaluation             End of Session  Equipment Utilized During Treatment: Gait belt;Back brace Activity Tolerance: Patient tolerated treatment well Patient left: Other (comment) (on toilet with wife present)     Time: [id number] PT Time Calculation (min) (ACUTE ONLY): 31 min  Charges:  $Gait Training: 8-22 mins $Therapeutic Activity: 8-22 mins                    G Codes:      Delcenia Inman LUBECK 11/24/2015, 10:26 AM

## 2015-11-24 NOTE — Care Management Note (Signed)
Case Management Note  Patient Details  Name: Dustin Henry MRN: [id number] Date of Birth: September 20, 1971  Subjective/Objective:                    Action/Plan: Met with patient to discuss discharge planning. Orders were received for HHPT at discharge. PT/OT did not recommend any follow-up. Spoke with patient, who is currently declining [place] services. Plan is for discharge home today. Bedside RN updated.  Expected Discharge Date:                  Expected Discharge Plan:  Home/Self Care  In-House Referral:     Discharge planning Services  CM Consult  Post Acute Care Choice:    Choice offered to:  Patient  DME Arranged:    DME Agency:     HH Arranged:    [place] Agency:     Status of Service:  Completed, signed off  Medicare Important Message Given:    Date Medicare IM Given:    Medicare IM give by:    Date Additional Medicare IM Given:    Additional Medicare Important Message give by:     If discussed at [place] of Stay Meetings, dates discussed:    Additional Comments:  Rolm Baptise, RN 11/24/2015, 11:54 AM [phone]

## 2015-11-29 NOTE — Discharge Summary (Signed)
Physician Discharge Summary  Patient ID: Dustin Henry MRN: [id number] DOB/AGE: 04/03/1971 44 y.o.  Admit date: 11/22/2015 Discharge date: 11/29/2015  Admission Diagnoses:  LBP and radicular leg pain  Discharge Diagnoses:  Active Problems:   Back pain   Past Medical History  Diagnosis Date  . ALLERGIC RHINITIS 08/18/2007  . BACK PAIN     herniated disc  . HYPERLIPIDEMIA 02/12/2010  . IBS (irritable bowel syndrome)   . HYPERTENSION     takes Amlodipine and Lisinopril-HCTZ daily  . MIGRAINE HEADACHE     takes Imitrex daily as needed  . Depression     takes Paxil daily  . History of kidney stones   . Nocturia   . Difficult intubation     09/05/10: difficult airway documented (ant cords vis with head up/cricoid press), MAC 4 used    Surgeries: Procedure(s): TLIF L5-S1 on 11/22/2015   Consultants (if any):    Discharged Condition: Improved  Hospital Course: Dustin Henry is an 44 y.o. male who was admitted 11/22/2015 with a diagnosis of LBP and radicular leg pain and went to the operating room on 11/22/2015 and underwent the above named procedures.  Pt was discharged on 11/24/15.  Hospital course was uneventful.  He was given perioperative antibiotics:  Anti-infectives    Start     Dose/Rate Route Frequency Ordered Stop   11/23/15 0000  vancomycin (VANCOCIN) 1,250 mg in sodium chloride 0.9 % 250 mL IVPB     1,250 mg 166.7 mL/hr over 90 Minutes Intravenous  Once 11/22/15 2104 11/23/15 0113   11/22/15 1004  vancomycin (VANCOCIN) IVPB 1000 mg/200 mL premix     1,000 mg 200 mL/hr over 60 Minutes Intravenous 60 min pre-op 11/22/15 1004 11/22/15 1340    .  He was given sequential compression devices, early ambulation, and TED for DVT prophylaxis.  He benefited maximally from the hospital stay and there were no complications.    Recent vital signs:  Filed Vitals:   11/24/15 0930 11/24/15 1350  BP: 134/77 137/72  Pulse: 99 96  Temp: 98 F (36.7 C) 98 F (36.7  C)  Resp: 18 20    Recent laboratory studies:  Lab Results  Component Value Date   HGB 14.5 11/17/2015   HGB 15.6 10/30/2015   HGB 14.2 09/04/2010   Lab Results  Component Value Date   WBC 4.2 11/17/2015   PLT 229 11/17/2015   Lab Results  Component Value Date   INR 1.0 04/19/2009   Lab Results  Component Value Date   NA 139 11/17/2015   K 3.9 11/17/2015   CL 106 11/17/2015   CO2 23 11/17/2015   BUN 7 11/17/2015   CREATININE 0.90 11/17/2015   GLUCOSE 138* 11/17/2015    Discharge Medications:     Medication List    STOP taking these medications        HYDROcodone-acetaminophen 5-325 MG tablet  Commonly known as:  NORCO/VICODIN     meloxicam 15 MG tablet  Commonly known as:  MOBIC     promethazine 25 MG tablet  Commonly known as:  PHENERGAN      TAKE these medications        amLODipine 10 MG tablet  Commonly known as:  NORVASC  Take 10 mg by mouth daily.     gabapentin 300 MG capsule  Commonly known as:  NEURONTIN  100 mg po TID     lisinopril-hydrochlorothiazide 20-25 MG tablet  Commonly known as:  PRINZIDE,ZESTORETIC  TAKE  1 TABLET BY MOUTH EVERY DAY     methocarbamol 500 MG tablet  Commonly known as:  ROBAXIN  Take 1 tablet (500 mg total) by mouth 3 (three) times daily as needed for muscle spasms.     ondansetron 4 MG tablet  Commonly known as:  ZOFRAN  Take 1 tablet (4 mg total) by mouth every 8 (eight) hours as needed for nausea or vomiting.     oxyCODONE-acetaminophen 10-325 MG tablet  Commonly known as:  PERCOCET  Take 1 tablet by mouth every 4 (four) hours as needed for pain.     PARoxetine 20 MG tablet  Commonly known as:  PAXIL  TAKE ONE TABLET BY MOUTH EVERY MORNING     SUMAtriptan 100 MG tablet  Commonly known as:  IMITREX  Take 1 tablet (100 mg total) by mouth every 2 (two) hours as needed for migraine or headache. May repeat in 2 hours if headache persists or recurs.        Diagnostic Studies: Dg Lumbar Spine 2-3  Views  11/23/2015  CLINICAL DATA:  Status post lumbar fusion EXAM: LUMBAR SPINE - 2-3 VIEW COMPARISON:  07/07/2015 FINDINGS: There are postsurgical changes from posterior fusion of L4 in L5. There is interbody spacer within the L4-5 disc space. No hardware complications identified. IMPRESSION: 1. Status post L4-5 posterior and interbody fusion. No complications. Electronically Signed   By: Kerby Moors M.D.   On: 11/23/2015 09:52   Dg Lumbar Spine 2-3 Views  11/22/2015  CLINICAL DATA:  44 year old male undergoing lumbar surgery. Initial encounter. EXAM: DG C-ARM GT 120 MIN; LUMBAR SPINE - 2-3 VIEW FLUOROSCOPY TIME:  Radiation Exposure Index (as provided by the fluoroscopic device): If the device does not provide the exposure index: Fluoroscopy Time (in minutes and seconds):  3 minutes 55 seconds Number of Acquired Images:  None COMPARISON:  Lumbar MRI 07/07/2015. FINDINGS: Same numbering system as on the comparison MRI. These images demonstrate posterior and interbody fusion at L4-L5. Hardware placement appears satisfactory. IMPRESSION: L4-L5 posterior and interbody fusion. Electronically Signed   By: Genevie Ann M.D.   On: 11/22/2015 16:46   Dg C-arm Gt 120 Min  11/22/2015  CLINICAL DATA:  44 year old male undergoing lumbar surgery. Initial encounter. EXAM: DG C-ARM GT 120 MIN; LUMBAR SPINE - 2-3 VIEW FLUOROSCOPY TIME:  Radiation Exposure Index (as provided by the fluoroscopic device): If the device does not provide the exposure index: Fluoroscopy Time (in minutes and seconds):  3 minutes 55 seconds Number of Acquired Images:  None COMPARISON:  Lumbar MRI 07/07/2015. FINDINGS: Same numbering system as on the comparison MRI. These images demonstrate posterior and interbody fusion at L4-L5. Hardware placement appears satisfactory. IMPRESSION: L4-L5 posterior and interbody fusion. Electronically Signed   By: Genevie Ann M.D.   On: 11/22/2015 16:46    Disposition: 01-Home or Self Care       Signed: Valinda Hoar 11/29/2015, 2:13 PM

## 2015-12-16 ENCOUNTER — Other Ambulatory Visit: Payer: Self-pay | Admitting: Internal Medicine

## 2015-12-26 ENCOUNTER — Other Ambulatory Visit: Payer: Self-pay | Admitting: Internal Medicine

## 2016-01-25 ENCOUNTER — Other Ambulatory Visit: Payer: Self-pay | Admitting: Internal Medicine

## 2016-01-28 ENCOUNTER — Other Ambulatory Visit: Payer: Self-pay | Admitting: Internal Medicine

## 2016-06-14 ENCOUNTER — Other Ambulatory Visit: Payer: Self-pay | Admitting: Internal Medicine

## 2016-06-14 ENCOUNTER — Ambulatory Visit: Payer: Self-pay | Admitting: Internal Medicine

## 2016-06-14 ENCOUNTER — Ambulatory Visit (INDEPENDENT_AMBULATORY_CARE_PROVIDER_SITE_OTHER): Payer: Managed Care, Other (non HMO) | Admitting: Internal Medicine

## 2016-06-14 ENCOUNTER — Encounter: Payer: Self-pay | Admitting: Internal Medicine

## 2016-06-14 VITALS — BP 152/90 | HR 91 | Temp 98.4°F | Ht 70.0 in | Wt 189.0 lb

## 2016-06-14 DIAGNOSIS — G932 Benign intracranial hypertension: Secondary | ICD-10-CM

## 2016-06-14 DIAGNOSIS — I10 Essential (primary) hypertension: Secondary | ICD-10-CM | POA: Diagnosis not present

## 2016-06-14 DIAGNOSIS — R519 Headache, unspecified: Secondary | ICD-10-CM

## 2016-06-14 DIAGNOSIS — H4710 Unspecified papilledema: Secondary | ICD-10-CM

## 2016-06-14 DIAGNOSIS — R51 Headache: Secondary | ICD-10-CM | POA: Diagnosis not present

## 2016-06-14 DIAGNOSIS — G43C0 Periodic headache syndromes in child or adult, not intractable: Secondary | ICD-10-CM

## 2016-06-14 LAB — BUN: BUN: 12 mg/dL (ref 6–23)

## 2016-06-14 LAB — CREATININE, SERUM: Creatinine, Ser: 0.96 mg/dL (ref 0.40–1.50)

## 2016-06-14 MED ORDER — ACETAZOLAMIDE ER 500 MG PO CP12: 500.0000 mg | ORAL_CAPSULE | Freq: Two times a day (BID) | ORAL | Status: DC

## 2016-06-14 NOTE — Progress Notes (Signed)
Subjective:    Patient ID: Dustin Henry, male    DOB: February 21, 1971, 45 y.o.   MRN: [id number]  HPI  45 year old patient who has a history of essential hypertension as well as migraine headaches. Migraine headaches are usually quite debilitating associated with light and sound sensitivity, putting the patient at bed rest.  These headaches usually respond to Imitrex For the past week he has had a pounding frontal headache without light or sound sensitivity that has been fairly constant.  He admits to considerable situational stress. He was seen earlier today for a routine eye examination.  He was diagnosed with mild papilledema. Patient denies any focal neurological symptoms or visual disturbances other than the need for reading glasses      Past Medical History  Diagnosis Date  . ALLERGIC RHINITIS 08/18/2007  . BACK PAIN     herniated disc  . HYPERLIPIDEMIA 02/12/2010  . IBS (irritable bowel syndrome)   . HYPERTENSION     takes Amlodipine and Lisinopril-HCTZ daily  . MIGRAINE HEADACHE     takes Imitrex daily as needed  . Depression     takes Paxil daily  . History of kidney stones   . Nocturia   . Difficult intubation     09/05/10: difficult airway documented (ant cords vis with head up/cricoid press), MAC 4 used            BP 152/90 mmHg  Pulse 91  Temp(Src) 98.4 F (36.9 C) (Oral)  Ht 5\' 10"  (1.778 m)  Wt 189 lb (85.73 kg)  BMI 27.12 kg/m2  SpO2 97%     Review of Systems  Constitutional: Negative for fever, chills, appetite change and fatigue.  HENT: Negative for congestion, dental problem, ear pain, hearing loss, sore throat, tinnitus, trouble swallowing and voice change.   Eyes: Negative for pain, discharge and visual disturbance.  Respiratory: Negative for cough, chest tightness, wheezing and stridor.   Cardiovascular: Negative for chest pain, palpitations and leg swelling.  Gastrointestinal: Negative for nausea, vomiting, abdominal pain, diarrhea,  constipation, blood in stool and abdominal distention.  Genitourinary: Negative for urgency, hematuria, flank pain, discharge, difficulty urinating and genital sores.  Musculoskeletal: Negative for myalgias, back pain, joint swelling, arthralgias, gait problem and neck stiffness.  Skin: Negative for rash.  Neurological: Positive for headaches. Negative for dizziness, syncope, speech difficulty, weakness and numbness.  Hematological: Negative for adenopathy. Does not bruise/bleed easily.  Psychiatric/Behavioral: Negative for behavioral problems and dysphoric mood. The patient is nervous/anxious.        Objective:   Physical Exam  Constitutional: He is oriented to person, place, and time. He appears well-developed.  Blood pressure 140/88  HENT:  Head: Normocephalic.  Right Ear: External ear normal.  Left Ear: External ear normal.  Eyes: Conjunctivae and EOM are normal. Pupils are equal, round, and reactive to light.  Funduscopic examination suggested some indistinct disc margins  Neck: Normal range of motion.  Cardiovascular: Normal rate and normal heart sounds.   Pulmonary/Chest: Breath sounds normal.  Abdominal: Bowel sounds are normal.  Musculoskeletal: Normal range of motion. He exhibits no edema or tenderness.  Neurological: He is alert and oriented to person, place, and time. No cranial nerve deficit. Coordination normal.  Psychiatric: He has a normal mood and affect. His behavior is normal.          Assessment & Plan:   Probable pseudotumor cerebri New-onset headaches for the past 5 days dissimilar  to migraine headaches/ papilledema per ophthalmology.  Will check  renal indices prior to imaging studies To include MRI with MRV.  Schedule neurology evaluation.  Will need LP if imaging studies unremarkable.    Nyoka Cowden, MD

## 2016-06-14 NOTE — Patient Instructions (Addendum)
Idiopathic Intracranial Hypertension Idiopathic intracranial hypertension (IIH) is a neurologic disorder that leads to increased pressure around your brain. It can cause vision loss and blindness if left untreated. RISK FACTORS IIH is most common in very overweight (obese) women of childbearing age. SIGNS AND SYMPTOMS  Symptoms of IIH include:  Headache.  Feeling of sickness in your stomach (nausea).  Vomiting.  A "rushing of water" sound within your ears (pulsatile tinnitus).  Double vision. DIAGNOSIS  Idiopathic intracranial hypertension is diagnosed with the aid of different exams:  Brain scans such as:  CT.  MRI.  MRV.  Diagnostic lumbar puncture. This procedure can determine if there is too much spinal fluid within the central nervous system. Too much spinal fluid can increase intracranial pressure.  A thorough eye exam will be done to look for swelling within the eyes. Visual field testing will also be done to see if any damage has occurred to nerves in the eyes. TREATMENT  Treatment of idiopathic intracranial hypertension is based on symptoms. Common treatments include:  Lumbar puncture to remove excess spinal fluid.  Medicine.  Surgery. HOME CARE INSTRUCTIONS The most important thing anyone can do to improve this condition is lose weight if they are overweight.  SEEK MEDICAL CARE IF:  You have changes in vision.  You have double vision.  You have loss of color vision. SEEK IMMEDIATE MEDICAL CARE IF:   Your headaches get worse rather than better.  Nausea or vomiting or both continue after treatment.  Your vision does not improve or gets worse after treatment. MAKE SURE YOU:  Understand these instructions.  Will watch your condition.  Will get help right away if you are not doing well or get worse.   This information is not intended to replace advice given to you by your health care provider. Make sure you discuss any questions you have with your  health care provider.   Document Released: 02/03/2002 Document Revised: 11/30/2013 Document Reviewed: 08/02/2013 Elsevier Interactive Patient Education 2016 [place] studies as discussed Neurology consultation Return in 3 weeks for follow-up

## 2016-06-14 NOTE — Progress Notes (Signed)
Pre visit review using our clinic review tool, if applicable. No additional management support is needed unless otherwise documented below in the visit note. 

## 2016-06-17 ENCOUNTER — Telehealth: Payer: Self-pay | Admitting: *Deleted

## 2016-06-17 NOTE — Telephone Encounter (Signed)
Spoke to pt and told him appointment scheduled for tomorrow at 7:45 AM with Dr. Tomi Likens. Pt verbalized understanding and said his office did get in touch with him. Told him okay good just wanted to make sure.

## 2016-06-18 ENCOUNTER — Encounter: Payer: Self-pay | Admitting: Neurology

## 2016-06-18 ENCOUNTER — Ambulatory Visit (INDEPENDENT_AMBULATORY_CARE_PROVIDER_SITE_OTHER): Payer: Managed Care, Other (non HMO) | Admitting: Neurology

## 2016-06-18 VITALS — BP 130/70 | HR 97 | Ht 70.0 in | Wt 185.0 lb

## 2016-06-18 DIAGNOSIS — R51 Headache: Secondary | ICD-10-CM

## 2016-06-18 DIAGNOSIS — H4710 Unspecified papilledema: Secondary | ICD-10-CM

## 2016-06-18 DIAGNOSIS — R519 Headache, unspecified: Secondary | ICD-10-CM

## 2016-06-18 NOTE — Progress Notes (Addendum)
NEUROLOGY CONSULTATION NOTE  TYE BRENNEMAN MRN: [id number] DOB: 31-May-1971  Referring provider: Dr. Burnice Logan Primary care provider: Dr. Burnice Logan  Reason for consult:  Papilledema, headache  HISTORY OF PRESENT ILLNESS: Dustin Henry is a 45 year old right-handed man with hypertension, hyperlipidemia, migraine, back pain and history of kidney stones who presents for headache and papilledema.  History obtained by patient and PCP note.  He has history of migraines since young adulthood, but they are now rare.  They are severe, bifrontal, pounding and associated with nausea, photophobia and phonophobia.  He always needs to lay down and they respond to sumatriptan 100mg .   On 06/10/16, he developed a new type of headache.  It is a bifrontal, pounding headache that is constant but fluctuates in intensity, anywhere from 1 to 10/10.  There are no associated symptoms such as nausea, photophobia or phonophobia.  Headaches are not positional.  For quite some time, he has had trouble reading but denied visual changes such as blurred vision or visual obscurations.  He denies pulsatile tinnitus.    He hasn't had an eye exam in many years. Due to longstanding problems with reading, he saw the optometrist on 06/14/16 for routine eye exam.  He reportedly was found to have mild papilledema.    He followed up with his PCP who ordered an MRI of the brain and orbits with and without contrast, as well as MRV ordered.  He has not had it scheduled due to financial concerns.  He has been unemployed for over a year and now works for a [employer] which doesn't pay well.  He has also had some marital problems.  Therefore, he has been under significant stress and anxiety. He attributes the increased headache to this stress.  He denies prior history of neurological symptoms such as visual loss, unilateral numbness or weakness or transient episode of gait instability.  He denies family history of  neurological disease such as MS and brain tumor.  BUN and Cr from 06/14/16 was 12 and 0.96 respectively.  PAST MEDICAL HISTORY: Past Medical History  Diagnosis Date  . ALLERGIC RHINITIS 08/18/2007  . BACK PAIN     herniated disc  . HYPERLIPIDEMIA 02/12/2010  . IBS (irritable bowel syndrome)   . HYPERTENSION     takes Amlodipine and Lisinopril-HCTZ daily  . MIGRAINE HEADACHE     takes Imitrex daily as needed  . Depression     takes Paxil daily  . History of kidney stones   . Nocturia   . Difficult intubation     09/05/10: difficult airway documented (ant cords vis with head up/cricoid press), MAC 4 used    PAST SURGICAL HISTORY: Past Surgical History  Procedure Laterality Date  . Finger surgery    . Cystoscopy    . Posterior cervical fusion/foraminotomy N/A 11/22/2015    Procedure: TLIF L5-S1;  Surgeon: Melina Schools, MD;  Location: [facility];  Service: Orthopedics;  Laterality: N/A;    MEDICATIONS: Current Outpatient Prescriptions on File Prior to Visit  Medication Sig Dispense Refill  . acetaZOLAMIDE (DIAMOX) 500 MG capsule Take 1 capsule (500 mg total) by mouth 2 (two) times daily. 60 capsule 0  . amLODipine (NORVASC) 10 MG tablet Take 10 mg by mouth daily.     Marland Kitchen HYDROcodone-acetaminophen (NORCO/VICODIN) 5-325 MG tablet TK 1 T PO  TID PRN  0  . lisinopril-hydrochlorothiazide (PRINZIDE,ZESTORETIC) 20-25 MG tablet TAKE ONE TABLET BY MOUTH DAILY 90 tablet 1  . PARoxetine (PAXIL) 20 MG tablet  TAKE ONE TABLET BY MOUTH EVERY MORNING 30 tablet 4  . promethazine (PHENERGAN) 25 MG tablet TAKE 1 TABLET BY MOUTH EVERY 8 HOURS AS NEEDED 30 tablet 0  . SUMAtriptan (IMITREX) 100 MG tablet TAKE 1 TABLET BY MOUTH EVERY 2 HOURS AS NEEDED MIGRAINE/HEADACHE. MAY REPEAT IN 2 HOURS IF NEEDED 3 tablet 2   No current facility-administered medications on file prior to visit.    ALLERGIES: Doxycycline Nausea Penicillin  FAMILY HISTORY: Family History  Problem Relation Age of Onset  . Heart  disease      No family history    SOCIAL HISTORY: Social History   Social History  . Marital Status: Married    Spouse Name: N/A  . Number of Children: 1  . Years of Education: N/A   Occupational History  . pressman helper     Engineer, structural   Social History Main Topics  . Smoking status: Former Smoker -- 3.00 packs/day for 10 years    Types: Cigarettes  . Smokeless tobacco: Never Used     Comment: quit smoking about 1yrs ago  . Alcohol Use: Yes     Comment: occasionally  . Drug Use: No  . Sexual Activity: Not on file   Other Topics Concern  . Not on file   Social History Narrative    REVIEW OF SYSTEMS: Constitutional: No fevers, chills, or sweats, no generalized fatigue, change in appetite Eyes: No visual changes, double vision, eye pain Ear, nose and throat: No hearing loss, ear pain, nasal congestion, sore throat Cardiovascular: No chest pain, palpitations Respiratory:  No shortness of breath at rest or with exertion, wheezes GastrointestinaI: No nausea, vomiting, diarrhea, abdominal pain, fecal incontinence Genitourinary:  No dysuria, urinary retention or frequency Musculoskeletal:  No neck pain, back pain Integumentary: No rash, pruritus, skin lesions Neurological: as above Psychiatric: No depression, insomnia, anxiety Endocrine: No palpitations, fatigue, diaphoresis, mood swings, change in appetite, change in weight, increased thirst Hematologic/Lymphatic:  No purpura, petechiae. Allergic/Immunologic: no itchy/runny eyes, nasal congestion, recent allergic reactions, rashes  PHYSICAL EXAM: Filed Vitals:   06/18/16 0742  BP: 130/70  Pulse: 97   General: No acute distress.  Patient appears well-groomed.  Anxious Head:  Normocephalic/atraumatic Eyes:  fundi does appear to show blurring of disc margins in left eye.  I am unable to visualize the fundus in the right eye. Neck: supple, no paraspinal tenderness, full range of motion Back: No paraspinal  tenderness Heart: regular rate and rhythm Lungs: Clear to auscultation bilaterally. Vascular: No carotid bruits. Neurological Exam: Mental status: alert and oriented to person, place, and time, recent and remote memory intact, fund of knowledge intact, attention and concentration intact, speech fluent and not dysarthric, language intact. Cranial nerves: CN I: not tested CN II: pupils equal, round and reactive to light, visual fields intact CN III, IV, VI:  full range of motion, no nystagmus, no ptosis CN V: facial sensation intact CN VII: upper and lower face symmetric CN VIII: hearing intact CN IX, X: gag intact, uvula midline CN XI: sternocleidomastoid and trapezius muscles intact CN XII: tongue midline Bulk & Tone: normal, no fasciculations. Motor:  5/5 throughout Sensation:  Pinprick and vibration sensation intact. Deep Tendon Reflexes:  2+ throughout, toes downgoing. Finger to nose testing:  Without dysmetria.  Heel to shin:  Without dysmetria.  Gait:  Normal station and stride.  Able to turn and tandem walk. Romberg negative.  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 Nonie Hoyer that he requires imaging of the brain, including MRV to rule out venous thrombosis.  We must rule out other causes of papilledema, such as mass lesion or demyelinating disease.  Due to his concern regarding finances, we will take it one step at a time.  We will have him examined by ophthalmology ASAP to confirm findings.  Regardless, he will need MRI of brain with and without contrast.  If papilledema is confirmed, then we may need to add MRV and will have to have a lumbar puncture as well.  He is in agreement with this plan.  Further recommendations pending testing results.  He will follow up after testing.  Blood pressure is elevated.  Likely related to anxiety but should be rechecked with Dr. Burnice Logan.  Thank you for allowing me to take part in the care of  this patient.  Metta Clines, DO  CC:  Bluford Kaufmann, MD

## 2016-06-18 NOTE — Progress Notes (Signed)
Referral faxed to [facility] - Dr. Kathlen Mody @ [phone] ph:[phone]

## 2016-06-18 NOTE — Patient Instructions (Addendum)
-   We definitely need to get an MRI of the brain with and without contrast. - I will refer you to an ophthalmologist to confirm eye findings. - If the ophthalmologist confirms swelling behind the eye, then we will need to add MRV of head. After this testing is done, it would have to be followed by a spinal tap. - Follow up after testing.

## 2016-06-19 ENCOUNTER — Telehealth: Payer: Self-pay

## 2016-06-19 LAB — HM DIABETES EYE EXAM

## 2016-06-19 NOTE — Telephone Encounter (Signed)
Called [employer] to get authorization for MRI w/ and w/o contrast. PCP has already ordered test with approval on 7/10. That approval number is [id number] Good from June 17, 2016- September 15, 2016. PCP's referral was updated with approval number. Cancelled our request and referral.

## 2016-06-20 ENCOUNTER — Telehealth: Payer: Self-pay

## 2016-06-20 ENCOUNTER — Other Ambulatory Visit: Payer: Self-pay | Admitting: Internal Medicine

## 2016-06-20 DIAGNOSIS — R519 Headache, unspecified: Secondary | ICD-10-CM

## 2016-06-20 DIAGNOSIS — R51 Headache: Principal | ICD-10-CM

## 2016-06-20 MED ORDER — TOPIRAMATE 25 MG PO TABS: ORAL_TABLET | ORAL | Status: DC

## 2016-06-20 NOTE — Telephone Encounter (Signed)
-----   Message from Pieter Partridge, DO sent at 06/20/2016  7:26 AM EDT ----- Dr. Kathleen Argue assessment is that he has no papilledema, which is good.  But since he has new-onset headaches, I would still proceed with just the MRI of brain with and without contrast.

## 2016-06-20 NOTE — Telephone Encounter (Signed)
Message relayed to patient. He denied question and verbalized understanding. New Rx was sent to pharmacy. Diamox was removed from medication list. Called [facility] to see if they would go ahead and call and schedule patient for Dr. Marthann Schiller. Juliann Pulse from GI is going to call and schedule MRI with patient.

## 2016-06-20 NOTE — Telephone Encounter (Signed)
-----   Message from Pieter Partridge, DO sent at 06/20/2016  8:16 AM EDT ----- Since he does not have actual swelling of the nerve, he can stop the acetazolamide.  Instead, for the headaches, we can start him on topiramate 25mg  at bedtime, increasing to 50mg  at bedtime in one week.  This medication is commonly used.  Possible side effects include problems concentrating and numbness and tingling, but this usually resolves when you get used to the dose. It may cause dehydration and there is a small risk for kidney stones, so make sure to stay hydrated with water during the day.  There is also a very small risk for glaucoma, so if you notice any change in your vision while taking this medication, see an ophthalmologist.

## 2016-06-24 ENCOUNTER — Telehealth: Payer: Self-pay

## 2016-06-24 NOTE — Telephone Encounter (Signed)
Relayed message to patient. Stated that he would not be able to go to hospital as wife is working and he must be at work by 10:30. Spoke to Dr. Tomi Likens, who stated if patient cannot get to hospital we need to see if MRI can be moved up from current scheduled time of Monday. Order is under PCP's name at [facility] [place], [place] [address] ph: [phone] fax: [phone]). Pt is going to call GI and/or PCP to see if MRI can be rescheduled for today/this week.

## 2016-06-24 NOTE — Telephone Encounter (Signed)
We need MRI of brain.  Based on worsening symptoms that he describes, he should go to ED to get an urgent MRI.

## 2016-06-24 NOTE — Telephone Encounter (Signed)
Patient called with complaints of worsening symptoms. Pt was seen on 06/18/16. Pt complains that he is experiencing worsening symptoms including: nausea, visional disturbances, tingling. Pt states he had to continuously take phenergan over the weekend for his nausea, never vomited though. Pt feels vision is worsening as well, states, he cannot read a text now w/o putting his glasses on. Pt complains of occasionally SOB, states it is random, first noticed on Friday, while walking up his drive way. Has also noticed it while walking through his house. Feels like his heart is going to beat out of his chest. He skipped last nights dose of Topamax, no difference in symptoms today. Pt stated he's previously been on Topamax and doesn't remember any side effects. Please advise.

## 2016-06-28 ENCOUNTER — Encounter: Payer: Self-pay | Admitting: Internal Medicine

## 2016-07-01 ENCOUNTER — Ambulatory Visit
Admission: RE | Admit: 2016-07-01 | Discharge: 2016-07-01 | Disposition: A | Discharge status: Home / Self Care | Payer: Managed Care, Other (non HMO) | Source: Ambulatory Visit | Attending: Internal Medicine | Admitting: Internal Medicine

## 2016-07-01 ENCOUNTER — Other Ambulatory Visit: Payer: Managed Care, Other (non HMO)

## 2016-07-01 DIAGNOSIS — H4710 Unspecified papilledema: Secondary | ICD-10-CM

## 2016-07-01 DIAGNOSIS — R519 Headache, unspecified: Secondary | ICD-10-CM

## 2016-07-01 DIAGNOSIS — R51 Headache: Principal | ICD-10-CM

## 2016-07-01 MED ORDER — GADOBENATE DIMEGLUMINE 529 MG/ML IV SOLN
18.0000 mL | Freq: Once | INTRAVENOUS | Status: AC | PRN
  Administered 2016-07-01: 18 mL via INTRAVENOUS

## 2016-07-18 ENCOUNTER — Other Ambulatory Visit: Payer: Self-pay | Admitting: Internal Medicine

## 2016-07-30 ENCOUNTER — Encounter: Payer: Self-pay | Admitting: Neurology

## 2016-07-30 ENCOUNTER — Ambulatory Visit (INDEPENDENT_AMBULATORY_CARE_PROVIDER_SITE_OTHER): Payer: Managed Care, Other (non HMO) | Admitting: Neurology

## 2016-07-30 VITALS — BP 138/84 | HR 87 | Ht 70.0 in | Wt 180.0 lb

## 2016-07-30 DIAGNOSIS — G43009 Migraine without aura, not intractable, without status migrainosus: Secondary | ICD-10-CM | POA: Diagnosis not present

## 2016-07-30 DIAGNOSIS — H47339 Pseudopapilledema of optic disc, unspecified eye: Secondary | ICD-10-CM | POA: Diagnosis not present

## 2016-07-30 NOTE — Progress Notes (Addendum)
NEUROLOGY FOLLOW UP OFFICE NOTE  Dustin Henry [id number]  HISTORY OF PRESENT ILLNESS: Dustin Henry is a 45 year old right-handed man with hypertension, hyperlipidemia, migraine, back pain and history of kidney stones who follows up for headache.  UPDATE: He was evaluated by ophthalmology, Dr. Kathlen Mody, on 06/19/16.  Exam revealed small crowded discs with hyperemia bilaterally, but no obscuration of the retinal nerve fiber layer and optical coherence tomography did not demonstrate significant thickening.  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  Due to the new-onset headaches, he did have an MRI of the brain with and without contrast, performed on 07/01/16 and personally reviewed, which demonstrated scattered punctate T2 and FLAIR hyperintensities in the subcortical and some periventricular white matter, likely related to migraine or chronic small vessel ischemic changes from hypertension.  Since he did not demonstrate actual papilledema, an MRV was not performed.  For headaches, he was started on topiramate 50mg  at bedtime.  He then reported side effects such as nausea, worsening vision, tingling and palpitations.  He discontinued the topiramate and the symptoms resolved.  His headaches resolved as well.  He reports some reduced stress as he was able to get a second job and now has a second income.  He also has new glasses, which are probably helping as well.  He is feeling well.  HISTORY: He has history of migraines since young adulthood, but they are now rare.  They are severe, bifrontal, pounding and associated with nausea, photophobia and phonophobia.  He always needs to lay down and they respond to sumatriptan 100mg .   On 06/10/16, he developed a new type of headache.  It is a bifrontal, pounding headache that is constant but fluctuates in intensity, anywhere from 1 to 10/10.  There are no associated symptoms such as nausea, photophobia or phonophobia.   Headaches are not positional.  For quite some time, he has had trouble reading but denied visual changes such as blurred vision or visual obscurations.  He denies pulsatile tinnitus.    He hasn't had an eye exam in many years. Due to longstanding problems with reading, he saw the optometrist on 06/14/16 for routine eye exam.  He reportedly was found to have mild papilledema.    He followed up with his PCP who started him on Diamox 500mg  twice daily and ordered an MRI of the brain and orbits with and without contrast, as well as MRV ordered.  He has not had it scheduled due to financial concerns.  He has been unemployed for over a year and now works for a [employer] which doesn't pay well.  He has also had some marital problems.  Therefore, he has been under significant stress and anxiety. He attributes the increased headache to this stress.  He denies prior history of neurological symptoms such as visual loss, unilateral numbness or weakness or transient episode of gait instability.  He denies family history of neurological disease such as MS and brain tumor.  PAST MEDICAL HISTORY: Past Medical History:  Diagnosis Date  . ALLERGIC RHINITIS 08/18/2007  . BACK PAIN    herniated disc  . Depression    takes Paxil daily  . Difficult intubation    09/05/10: difficult airway documented (ant cords vis with head up/cricoid press), MAC 4 used  . History of kidney stones   . HYPERLIPIDEMIA 02/12/2010  . HYPERTENSION    takes Amlodipine and Lisinopril-HCTZ daily  . IBS (irritable bowel syndrome)   . MIGRAINE HEADACHE  takes Imitrex daily as needed  . Nocturia     MEDICATIONS: Current Outpatient Prescriptions on File Prior to Visit  Medication Sig Dispense Refill  . amLODipine (NORVASC) 10 MG tablet Take 10 mg by mouth daily.     Marland Kitchen HYDROcodone-acetaminophen (NORCO/VICODIN) 5-325 MG tablet TK 1 T PO  TID PRN  0  . lisinopril-hydrochlorothiazide (PRINZIDE,ZESTORETIC) 20-25 MG tablet TAKE ONE  TABLET BY MOUTH DAILY 90 tablet 1  . PARoxetine (PAXIL) 20 MG tablet TAKE ONE TABLET BY MOUTH EVERY MORNING 30 tablet 4  . promethazine (PHENERGAN) 25 MG tablet TAKE 1 TABLET BY MOUTH EVERY 8 HOURS AS NEEDED 30 tablet 0  . SUMAtriptan (IMITREX) 100 MG tablet TAKE 1 TABLET BY MOUTH EVERY 2 HOURS AS NEEDED MIGRAINE/HEADACHE. MAY REPEAT IN 2 HOURS IF NEEDED 3 tablet 1   No current facility-administered medications on file prior to visit.     ALLERGIES: Doxycycline PCN  FAMILY HISTORY: Family History  Problem Relation Age of Onset  . Heart disease      No family history    SOCIAL HISTORY: Social History   Social History  . Marital status: Married    Spouse name: N/A  . Number of children: 1  . Years of education: N/A   Occupational History  . pressman helper     Engineer, structural   Social History Main Topics  . Smoking status: Former Smoker    Packs/day: 3.00    Years: 10.00    Types: Cigarettes  . Smokeless tobacco: Never Used     Comment: quit smoking about 25yrs ago  . Alcohol use Yes     Comment: occasionally  . Drug use: No  . Sexual activity: Not on file   Other Topics Concern  . Not on file   Social History Narrative  . No narrative on file    REVIEW OF SYSTEMS: Constitutional: No fevers, chills, or sweats, no generalized fatigue, change in appetite Eyes: No visual changes, double vision, eye pain Ear, nose and throat: No hearing loss, ear pain, nasal congestion, sore throat Cardiovascular: No chest pain, palpitations Respiratory:  No shortness of breath at rest or with exertion, wheezes GastrointestinaI: No nausea, vomiting, diarrhea, abdominal pain, fecal incontinence Genitourinary:  No dysuria, urinary retention or frequency Musculoskeletal:  No neck pain, back pain Integumentary: No rash, pruritus, skin lesions Neurological: as above Psychiatric: No depression, insomnia, anxiety Endocrine: No palpitations, fatigue, diaphoresis, mood swings, change in  appetite, change in weight, increased thirst Hematologic/Lymphatic:  No purpura, petechiae. Allergic/Immunologic: no itchy/runny eyes, nasal congestion, recent allergic reactions, rashes  PHYSICAL EXAM: Vitals:   07/30/16 1248  BP: 138/84  Pulse: 87   General: No acute distress.  Patient appears well-groomed.  Normal body habitus. Neurological Exam: alert and oriented to person, place, and time. Attention span and concentration intact, recent and remote memory intact, fund of knowledge intact.  Speech fluent and not dysarthric, language intact.  CN II-XII intact. Bulk and tone normal, muscle strength 5/5 throughout.  Sensation to light touch, temperature and vibration intact.  Deep tendon reflexes 2+ throughout, toes downgoing.  Finger to nose and heel to shin testing intact.  Gait normal, Romberg negative.  IMPRESSION: Headache, resolved.  Probably a change in migraine. Pseudopapilledema.  Nothing pathologic  PLAN: Follow up as needed.  15 minutes spent face to face with patient, over 50% spent counseling.  Metta Clines, DO  CC:  Bluford Kaufmann, MD

## 2016-11-21 ENCOUNTER — Other Ambulatory Visit: Payer: Self-pay | Admitting: Internal Medicine

## 2016-12-24 DIAGNOSIS — M5416 Radiculopathy, lumbar region: Secondary | ICD-10-CM | POA: Diagnosis not present

## 2017-04-11 ENCOUNTER — Ambulatory Visit (INDEPENDENT_AMBULATORY_CARE_PROVIDER_SITE_OTHER): Payer: 59 | Admitting: Family Medicine

## 2017-04-11 ENCOUNTER — Encounter: Payer: Self-pay | Admitting: Family Medicine

## 2017-04-11 VITALS — BP 122/80 | HR 82 | Temp 98.2°F | Resp 12 | Ht 70.0 in | Wt 186.4 lb

## 2017-04-11 DIAGNOSIS — J069 Acute upper respiratory infection, unspecified: Secondary | ICD-10-CM | POA: Diagnosis not present

## 2017-04-11 DIAGNOSIS — J029 Acute pharyngitis, unspecified: Secondary | ICD-10-CM | POA: Diagnosis not present

## 2017-04-11 LAB — POCT RAPID STREP A (OFFICE): RAPID STREP A SCREEN: NEGATIVE

## 2017-04-11 MED ORDER — MAGIC MOUTHWASH W/LIDOCAINE: 5.0000 mL | Freq: Three times a day (TID) | ORAL | 0 refills | Status: AC | PRN

## 2017-04-11 MED ORDER — BENZONATATE 100 MG PO CAPS: 100.0000 mg | ORAL_CAPSULE | Freq: Three times a day (TID) | ORAL | 0 refills | Status: AC | PRN

## 2017-04-11 NOTE — Patient Instructions (Signed)
  [name] I have seen you today for an acute visit.  A few things to remember from today's visit:   Sore throat - Plan: POC Rapid Strep A, Culture, Group A Strep, magic mouthwash w/lidocaine SOLN  URI, acute - Plan: benzonatate (TESSALON) 100 MG capsule     viral infections are self-limited and we treat each symptom depending of severity.  Over the counter medications as decongestants and cold medications usually help, they need to be taken with caution if there is a history of high blood pressure or palpitations. Tylenol and/or Ibuprofen also helps with most symptoms (headache, muscle aching, fever,etc) Plenty of fluids. Honey helps with cough. Steam inhalations helps with runny nose, nasal congestion, and may prevent sinus infections. Cough and nasal congestion could last a few days and sometimes weeks. Please follow in not any better in 1-2 weeks or if symptoms get worse.   Symptomatic treatment: Over the counter Acetaminophen 500 mg and/or Ibuprofen (400-600 mg) if there is not contraindications; you can alternate in between both every 4-6 hours. Gargles with saline water and throat lozenges might also help. Cold fluids.    Seek prompt medical evaluation if you are having difficulty breathing, mouth swelling, throat closing up, not able to swallow liquids (drooling), skin rash/bruising, or worsening symptoms.  Please follow up in 2 weeks if not any better.      Medications prescribed today are intended for short period of time and will not be refill upon request, a follow up appointment might be necessary to discuss continuation of of treatment if appropriate.     In general please monitor for signs of worsening symptoms and seek immediate medical attention if any concerning.  If symptoms are not resolved in 1-2 weeks you should schedule a follow up appointment with your doctor, before if needed.  Please be sure you have an appointment already scheduled  with your PCP before you leave today.

## 2017-04-11 NOTE — Progress Notes (Signed)
Pre visit review using our clinic review tool, if applicable. No additional management support is needed unless otherwise documented below in the visit note. 

## 2017-04-11 NOTE — Progress Notes (Signed)
HPI:  ACUTE VISIT  Chief Complaint  Patient presents with  . Cough  . Sore Throat    DustinDustin Henry is a 46 y.o.male here today complaining of 2-3 days of sore throat.  Initially he thought it was allergy related.  Cough  This is a new problem. The current episode started in the past 7 days. The problem has been rapidly worsening. The problem occurs constantly. The cough is non-productive. Associated symptoms include a sore throat. Pertinent negatives include no chest pain, chills, ear congestion, ear pain, eye redness, fever, headaches, heartburn, hemoptysis, myalgias, nasal congestion, postnasal drip, rash, rhinorrhea, shortness of breath, sweats or wheezing. Nothing aggravates the symptoms. The treatment provided no relief. His past medical history is significant for environmental allergies. There is no history of asthma.  Sore Throat   Associated symptoms include congestion and coughing. Pertinent negatives include no abdominal pain, diarrhea, ear pain, headaches, neck pain, shortness of breath, trouble swallowing or vomiting.     "Really bad" sore throat and last night started coughing, not sleeping well.Pain is exacerbated by swallowing, talking. No alleviating factors identified. He denies throat/face edema or stridor.  No nasal congestion, rhinorrhea,of body aches.  He has not noted chest pain, dyspnea, or wheezing.  No Hx of recent travel. No sick contact. No known insect bite.  Hx of allergies: Yes, he takes Zyrtec 10 mg daily.  OTC medications for this problem: OTC cough drops and Ibuprofen and Tylenol.   Review of Systems  Constitutional: Positive for appetite change and fatigue. Negative for chills and fever.  HENT: Positive for congestion and sore throat. Negative for ear pain, facial swelling, mouth sores, postnasal drip, rhinorrhea, sneezing, trouble swallowing and voice change.   Eyes: Negative for discharge, redness and itching.  Respiratory:  Positive for cough. Negative for hemoptysis, shortness of breath and wheezing.   Cardiovascular: Negative for chest pain and leg swelling.  Gastrointestinal: Negative for abdominal pain, diarrhea, heartburn, nausea and vomiting.  Musculoskeletal: Negative for myalgias and neck pain.  Skin: Negative for pallor and rash.  Allergic/Immunologic: Positive for environmental allergies.  Neurological: Negative for weakness and headaches.  Hematological: Negative for adenopathy. Does not bruise/bleed easily.  Psychiatric/Behavioral: Negative for confusion. The patient is nervous/anxious.      Current Outpatient Prescriptions on File Prior to Visit  Medication Sig Dispense Refill  . amLODipine (NORVASC) 10 MG tablet Take 10 mg by mouth daily.     Marland Kitchen amLODipine (NORVASC) 10 MG tablet TAKE 1 TABLET(10 MG) BY MOUTH DAILY 90 tablet 0  . HYDROcodone-acetaminophen (NORCO/VICODIN) 5-325 MG tablet TK 1 T PO  TID PRN  0  . lisinopril-hydrochlorothiazide (PRINZIDE,ZESTORETIC) 20-25 MG tablet TAKE ONE TABLET BY MOUTH DAILY 90 tablet 1  . PARoxetine (PAXIL) 20 MG tablet TAKE ONE TABLET BY MOUTH EVERY MORNING 30 tablet 4  . promethazine (PHENERGAN) 25 MG tablet TAKE 1 TABLET BY MOUTH EVERY 8 HOURS AS NEEDED 30 tablet 0  . SUMAtriptan (IMITREX) 100 MG tablet TAKE 1 TABLET BY MOUTH EVERY 2 HOURS AS NEEDED MIGRAINE/HEADACHE. MAY REPEAT IN 2 HOURS IF NEEDED 3 tablet 0   No current facility-administered medications on file prior to visit.      Past Medical History:  Diagnosis Date  . ALLERGIC RHINITIS 08/18/2007  . BACK PAIN    herniated disc  . Depression    takes Paxil daily  . Difficult intubation    09/05/10: difficult airway documented (ant cords vis with head up/cricoid press), MAC  4 used  . History of kidney stones   . HYPERLIPIDEMIA 02/12/2010  . HYPERTENSION    takes Amlodipine and Lisinopril-HCTZ daily  . IBS (irritable bowel syndrome)   . MIGRAINE HEADACHE    takes Imitrex daily as needed  .  Nocturia    Allergies  Allergen Reactions  . Doxycycline     REACTION: nausea  . Penicillins Other (See Comments)        Social History   Social History  . Marital status: Married    Spouse name: N/A  . Number of children: 1  . Years of education: N/A   Occupational History  . pressman helper     Engineer, structural   Social History Main Topics  . Smoking status: Former Smoker    Packs/day: 3.00    Years: 10.00    Types: Cigarettes  . Smokeless tobacco: Never Used     Comment: quit smoking about 64yr ago  . Alcohol use Yes     Comment: occasionally  . Drug use: No  . Sexual activity: Not Asked   Other Topics Concern  . None   Social History Narrative  . None    Vitals:   04/11/17 0927  BP: 122/80  Pulse: 82  Resp: 12  Temp: 98.2 F (36.8 C)  O2 sat at RA 97% Body mass index is 26.74 kg/m.   Physical Exam  Nursing note and vitals reviewed. Constitutional: He is oriented to person, place, and time. He appears well-developed and well-nourished. He does not appear ill. No distress.  HENT:  Head: Atraumatic.  Right Ear: Tympanic membrane, external ear and ear canal normal.  Left Ear: Tympanic membrane, external ear and ear canal normal.  Nose: Rhinorrhea present. Right sinus exhibits no maxillary sinus tenderness and no frontal sinus tenderness. Left sinus exhibits no maxillary sinus tenderness and no frontal sinus tenderness.  Mouth/Throat: Uvula is midline and mucous membranes are normal. Posterior oropharyngeal erythema present. No oropharyngeal exudate or posterior oropharyngeal edema.  Eyes: Conjunctivae and EOM are normal.  Cardiovascular: Normal rate and regular rhythm.   No murmur heard. Respiratory: Effort normal and breath sounds normal. No stridor. No respiratory distress.  Musculoskeletal: He exhibits no edema.  Lymphadenopathy:       Head (right side): No submandibular adenopathy present.       Head (left side): No submandibular adenopathy  present.    He has cervical adenopathy.       Right cervical: Posterior cervical adenopathy present.       Left cervical: Posterior cervical adenopathy present.  Neurological: He is alert and oriented to person, place, and time. He has normal strength. No cranial nerve deficit. Gait normal.  Skin: Skin is warm. No rash noted. No erythema.  Psychiatric: He has a normal mood and affect. His speech is normal.  Well groomed, good eye contact.    ASSESSMENT AND PLAN:   CNadavwas seen today for cough and sore throat.  Diagnoses and all orders for this visit:  Acute pharyngitis, unspecified etiology  Rapid strep negative. Continue Tylenol and OTC NSAID's,alternating qid and as needed. Instructed about warning signs. Will follow throat Cx and give recommendations accordingly. He states that he does not need a note for work.  -     POC Rapid Strep A -     Culture, Group A Strep -     magic mouthwash w/lidocaine SOLN; Take 5 mLs by mouth 3 (three) times daily as needed for mouth pain.  URI,  acute  Symptoms suggests a viral etiology, Symptomatic treatment recommended. Instructed to monitor for signs of complications, including new onset of fever among some, clearly instructed about warning signs. I also explained that cough and nasal congestion can last a few days and sometimes weeks. F/U as needed.   -     benzonatate (TESSALON) 100 MG capsule; Take 1-2 capsules (100-200 mg total) by mouth 3 (three) times daily as needed for cough.      -Dustin Henry advised to return or notify a doctor immediately if symptoms worsen or persist or new concerns arise, he voices understanding.       Dustin Terhune G. Martinique, MD  [facility]. [place] office.

## 2017-04-13 LAB — CULTURE, GROUP A STREP

## 2017-04-24 DIAGNOSIS — G894 Chronic pain syndrome: Secondary | ICD-10-CM | POA: Diagnosis not present

## 2017-04-24 DIAGNOSIS — M5416 Radiculopathy, lumbar region: Secondary | ICD-10-CM | POA: Diagnosis not present

## 2017-04-24 DIAGNOSIS — Z9889 Other specified postprocedural states: Secondary | ICD-10-CM | POA: Diagnosis not present

## 2017-04-28 ENCOUNTER — Encounter: Payer: Self-pay | Admitting: Family Medicine

## 2017-04-30 ENCOUNTER — Other Ambulatory Visit: Payer: Self-pay | Admitting: Internal Medicine

## 2017-05-24 ENCOUNTER — Other Ambulatory Visit: Payer: Self-pay | Admitting: Internal Medicine

## 2017-06-06 ENCOUNTER — Other Ambulatory Visit: Payer: Self-pay | Admitting: Internal Medicine

## 2017-06-30 DIAGNOSIS — M25511 Pain in right shoulder: Secondary | ICD-10-CM | POA: Diagnosis not present

## 2017-07-24 DIAGNOSIS — M961 Postlaminectomy syndrome, not elsewhere classified: Secondary | ICD-10-CM | POA: Diagnosis not present

## 2017-07-24 DIAGNOSIS — G894 Chronic pain syndrome: Secondary | ICD-10-CM | POA: Diagnosis not present

## 2017-07-24 DIAGNOSIS — M545 Low back pain: Secondary | ICD-10-CM | POA: Diagnosis not present

## 2017-09-09 ENCOUNTER — Other Ambulatory Visit: Payer: Self-pay | Admitting: Internal Medicine

## 2017-09-09 DIAGNOSIS — M25511 Pain in right shoulder: Secondary | ICD-10-CM | POA: Diagnosis not present

## 2017-09-09 DIAGNOSIS — M5412 Radiculopathy, cervical region: Secondary | ICD-10-CM | POA: Diagnosis not present

## 2017-09-18 DIAGNOSIS — M5412 Radiculopathy, cervical region: Secondary | ICD-10-CM | POA: Diagnosis not present

## 2017-09-25 DIAGNOSIS — M5412 Radiculopathy, cervical region: Secondary | ICD-10-CM | POA: Diagnosis not present

## 2017-10-02 DIAGNOSIS — M5412 Radiculopathy, cervical region: Secondary | ICD-10-CM | POA: Diagnosis not present

## 2017-10-07 DIAGNOSIS — M25511 Pain in right shoulder: Secondary | ICD-10-CM | POA: Diagnosis not present

## 2017-10-07 DIAGNOSIS — M5412 Radiculopathy, cervical region: Secondary | ICD-10-CM | POA: Diagnosis not present

## 2017-10-15 DIAGNOSIS — M25511 Pain in right shoulder: Secondary | ICD-10-CM | POA: Diagnosis not present

## 2017-10-15 DIAGNOSIS — M5412 Radiculopathy, cervical region: Secondary | ICD-10-CM | POA: Diagnosis not present

## 2017-10-21 ENCOUNTER — Other Ambulatory Visit: Payer: Self-pay | Admitting: Internal Medicine

## 2017-10-22 ENCOUNTER — Other Ambulatory Visit: Payer: Self-pay | Admitting: Internal Medicine

## 2017-10-22 DIAGNOSIS — M961 Postlaminectomy syndrome, not elsewhere classified: Secondary | ICD-10-CM | POA: Diagnosis not present

## 2017-10-22 DIAGNOSIS — M5412 Radiculopathy, cervical region: Secondary | ICD-10-CM | POA: Diagnosis not present

## 2017-10-22 DIAGNOSIS — M50822 Other cervical disc disorders at C5-C6 level: Secondary | ICD-10-CM | POA: Diagnosis not present

## 2017-10-23 DIAGNOSIS — M50822 Other cervical disc disorders at C5-C6 level: Secondary | ICD-10-CM | POA: Diagnosis not present

## 2017-10-23 DIAGNOSIS — G8929 Other chronic pain: Secondary | ICD-10-CM | POA: Diagnosis not present

## 2017-10-23 DIAGNOSIS — M25511 Pain in right shoulder: Secondary | ICD-10-CM | POA: Diagnosis not present

## 2017-11-04 DIAGNOSIS — H47333 Pseudopapilledema of optic disc, bilateral: Secondary | ICD-10-CM | POA: Diagnosis not present

## 2017-11-13 DIAGNOSIS — M542 Cervicalgia: Secondary | ICD-10-CM | POA: Diagnosis not present

## 2017-11-13 DIAGNOSIS — M50822 Other cervical disc disorders at C5-C6 level: Secondary | ICD-10-CM | POA: Diagnosis not present

## 2017-11-13 DIAGNOSIS — M5136 Other intervertebral disc degeneration, lumbar region: Secondary | ICD-10-CM | POA: Diagnosis not present

## 2017-11-18 ENCOUNTER — Other Ambulatory Visit: Payer: Self-pay | Admitting: Internal Medicine

## 2017-11-19 ENCOUNTER — Other Ambulatory Visit: Payer: Self-pay | Admitting: Internal Medicine

## 2017-11-20 NOTE — Telephone Encounter (Signed)
Ok to refilled medication? Please advise

## 2017-11-20 NOTE — Telephone Encounter (Signed)
Okay for refill?  

## 2017-12-30 DIAGNOSIS — M25511 Pain in right shoulder: Secondary | ICD-10-CM | POA: Diagnosis not present

## 2017-12-30 DIAGNOSIS — M542 Cervicalgia: Secondary | ICD-10-CM | POA: Diagnosis not present

## 2018-01-05 ENCOUNTER — Other Ambulatory Visit: Payer: Self-pay | Admitting: Internal Medicine

## 2018-01-13 ENCOUNTER — Ambulatory Visit: Payer: 59 | Admitting: Adult Health

## 2018-01-13 ENCOUNTER — Encounter: Payer: Self-pay | Admitting: Adult Health

## 2018-01-13 VITALS — BP 120/90 | Temp 98.2°F | Wt 193.0 lb

## 2018-01-13 DIAGNOSIS — F411 Generalized anxiety disorder: Secondary | ICD-10-CM

## 2018-01-13 DIAGNOSIS — R110 Nausea: Secondary | ICD-10-CM | POA: Diagnosis not present

## 2018-01-13 MED ORDER — PROMETHAZINE HCL 25 MG PO TABS: 25.0000 mg | ORAL_TABLET | Freq: Three times a day (TID) | ORAL | 0 refills | Status: DC | PRN

## 2018-01-13 MED ORDER — PAROXETINE HCL 20 MG PO TABS: 20.0000 mg | ORAL_TABLET | Freq: Every morning | ORAL | 0 refills | Status: DC

## 2018-01-13 NOTE — Progress Notes (Signed)
Subjective:    Patient ID: Dustin Henry, male    DOB: 11-Nov-1971, 47 y.o.   MRN: [id number]  HPI 47 year old male who  has a past medical history of ALLERGIC RHINITIS (08/18/2007), BACK PAIN, Depression, Difficult intubation, History of kidney stones, HYPERLIPIDEMIA (02/12/2010), HYPERTENSION, IBS (irritable bowel syndrome), MIGRAINE HEADACHE, and Nocturia.  He is a patient of Dr. Raliegh Ip who I am seeing today for medication refill. He needs a refill for Paxil and Phenergan. His PCP is out and he has not been seen in about a year.He would like enough to get him through until he can be seen by his PCP  He has been out of Paxil for less then a week    Review of Systems   See HPI   Past Medical History:  Diagnosis Date  . ALLERGIC RHINITIS 08/18/2007  . BACK PAIN    herniated disc  . Depression    takes Paxil daily  . Difficult intubation    09/05/10: difficult airway documented (ant cords vis with head up/cricoid press), MAC 4 used  . History of kidney stones   . HYPERLIPIDEMIA 02/12/2010  . HYPERTENSION    takes Amlodipine and Lisinopril-HCTZ daily  . IBS (irritable bowel syndrome)   . MIGRAINE HEADACHE    takes Imitrex daily as needed  . Nocturia     Social History   Socioeconomic History  . Marital status: Married    Spouse name: Not on file  . Number of children: 1  . Years of education: Not on file  . Highest education level: Not on file  Social Needs  . Financial resource strain: Not on file  . Food insecurity - worry: Not on file  . Food insecurity - inability: Not on file  . Transportation needs - medical: Not on file  . Transportation needs - non-medical: Not on file  Occupational History  . Occupation: pressman helper    Comment: Engineer, structural  Tobacco Use  . Smoking status: Former Smoker    Packs/day: 3.00    Years: 10.00    Pack years: 30.00    Types: Cigarettes  . Smokeless tobacco: Never Used  . Tobacco comment: quit smoking about 42yr ago  Substance  and Sexual Activity  . Alcohol use: Yes    Comment: occasionally  . Drug use: No  . Sexual activity: Not on file  Other Topics Concern  . Not on file  Social History Narrative  . Not on file    Past Surgical History:  Procedure Laterality Date  . CYSTOSCOPY    . FINGER SURGERY    . POSTERIOR CERVICAL FUSION/FORAMINOTOMY N/A 11/22/2015   Procedure: TLIF L5-S1;  Surgeon: DMelina Schools MD;  Location: [facility]  Service: Orthopedics;  Laterality: N/A;    Family History  Problem Relation Age of Onset  . Heart disease Unknown        No family history      Current Outpatient Medications on File Prior to Visit  Medication Sig Dispense Refill  . gabapentin (NEURONTIN) 300 MG capsule Take 300 mg by mouth 3 (three) times daily.    .Marland KitchenHYDROcodone-acetaminophen (NORCO/VICODIN) 5-325 MG tablet TK 1 T PO  TID PRN  0  . lisinopril-hydrochlorothiazide (PRINZIDE,ZESTORETIC) 20-25 MG tablet TAKE ONE TABLET BY MOUTH DAILY 90 tablet 0  . SUMAtriptan (IMITREX) 100 MG tablet TAKE 1 TABLET BY MOUTH EVERY 2 HOURS AS NEEDED MIGRAINE/HEADACHE. MAY REPEAT IN 2 HOURS IF NEEDED 3 tablet 0  . PARoxetine (  PAXIL) 20 MG tablet TAKE ONE TABLET BY MOUTH EVERY MORNING (Patient not taking: Reported on 01/13/2018) 30 tablet 0  . promethazine (PHENERGAN) 25 MG tablet TAKE 1 TABLET BY MOUTH EVERY 8 HOURS AS NEEDED (Patient not taking: Reported on 01/13/2018) 30 tablet 0   No current facility-administered medications on file prior to visit.     BP 120/90 (BP Location: Left Arm)   Temp 98.2 F (36.8 C) (Oral)   Wt 193 lb (87.5 kg)   BMI 27.69 kg/m       Objective:   Physical Exam  Constitutional: He is oriented to person, place, and time. He appears well-developed and well-nourished. No distress.  Cardiovascular: Normal rate, regular rhythm, normal heart sounds and intact distal pulses. Exam reveals no gallop and no friction rub.  No murmur heard. Pulmonary/Chest: Effort normal and breath sounds normal. No  respiratory distress. He has no wheezes. He has no rales. He exhibits no tenderness.  Neurological: He is alert and oriented to person, place, and time.  Skin: Skin is warm and dry. No rash noted. He is not diaphoretic. No erythema. No pallor.  Psychiatric: He has a normal mood and affect. His behavior is normal. Judgment and thought content normal.  Nursing note and vitals reviewed.     Assessment & Plan:  1. Anxiety state - PARoxetine (PAXIL) 20 MG tablet; Take 1 tablet (20 mg total) by mouth every morning.  Dispense: 30 tablet; Refill: 0 - Follow up with PCP for refills  2. Nausea  - promethazine (PHENERGAN) 25 MG tablet; Take 1 tablet (25 mg total) by mouth every 8 (eight) hours as needed.  Dispense: 30 tablet; Refill: 0  - follow up with PCP for refills   Dorothyann Peng, NP

## 2018-01-20 ENCOUNTER — Ambulatory Visit: Payer: 59 | Admitting: Adult Health

## 2018-01-21 DIAGNOSIS — G894 Chronic pain syndrome: Secondary | ICD-10-CM | POA: Diagnosis not present

## 2018-01-21 DIAGNOSIS — M545 Low back pain: Secondary | ICD-10-CM | POA: Diagnosis not present

## 2018-02-03 ENCOUNTER — Ambulatory Visit: Payer: 59 | Admitting: Internal Medicine

## 2018-02-03 ENCOUNTER — Encounter: Payer: Self-pay | Admitting: Internal Medicine

## 2018-02-03 VITALS — BP 150/90 | HR 97 | Temp 98.4°F | Ht 70.0 in | Wt 195.0 lb

## 2018-02-03 DIAGNOSIS — I10 Essential (primary) hypertension: Secondary | ICD-10-CM

## 2018-02-03 DIAGNOSIS — R110 Nausea: Secondary | ICD-10-CM

## 2018-02-03 DIAGNOSIS — F411 Generalized anxiety disorder: Secondary | ICD-10-CM | POA: Diagnosis not present

## 2018-02-03 DIAGNOSIS — M545 Low back pain: Secondary | ICD-10-CM

## 2018-02-03 DIAGNOSIS — G8929 Other chronic pain: Secondary | ICD-10-CM

## 2018-02-03 MED ORDER — PAROXETINE HCL 20 MG PO TABS: 20.0000 mg | ORAL_TABLET | Freq: Every morning | ORAL | 3 refills | Status: DC

## 2018-02-03 MED ORDER — PROMETHAZINE HCL 25 MG PO TABS: 25.0000 mg | ORAL_TABLET | Freq: Three times a day (TID) | ORAL | 0 refills | Status: DC | PRN

## 2018-02-03 NOTE — Patient Instructions (Addendum)
Limit your sodium (Salt) intake  Please check your blood pressure on a regular basis.  If it is consistently greater than 140/85,  please make an office appointment.  Return in 6 months for follow-up

## 2018-02-03 NOTE — Progress Notes (Signed)
Subjective:    Patient ID: Dustin Henry, male    DOB: 1971/08/29, 47 y.o.   MRN: [id number]  HPI  47 year old patient who has a history of essential hypertension.  He is seen today for follow-up. He has a history of chronic low back pain as well as chronic right shoulder pain. He is scheduled for elective surgery on his right shoulder in 6 days.  Physical therapy and injections have not been successful.  He works as a Development worker, international [employer]  Past Medical History:  Diagnosis Date  . ALLERGIC RHINITIS 08/18/2007  . BACK PAIN    herniated disc  . Depression    takes Paxil daily  . Difficult intubation    09/05/10: difficult airway documented (ant cords vis with head up/cricoid press), MAC 4 used  . History of kidney stones   . HYPERLIPIDEMIA 02/12/2010  . HYPERTENSION    takes Amlodipine and Lisinopril-HCTZ daily  . IBS (irritable bowel syndrome)   . MIGRAINE HEADACHE    takes Imitrex daily as needed  . Nocturia          Family History  Problem Relation Age of Onset  . Heart disease Unknown        No family history      Current Outpatient Medications on File Prior to Visit  Medication Sig Dispense Refill  . gabapentin (NEURONTIN) 300 MG capsule Take 300 mg by mouth 3 (three) times daily.    Marland Kitchen HYDROcodone-acetaminophen (NORCO/VICODIN) 5-325 MG tablet TK 1 T PO  TID PRN  0  . lisinopril-hydrochlorothiazide (PRINZIDE,ZESTORETIC) 20-25 MG tablet TAKE ONE TABLET BY MOUTH DAILY 90 tablet 0  . SUMAtriptan (IMITREX) 100 MG tablet TAKE 1 TABLET BY MOUTH EVERY 2 HOURS AS NEEDED MIGRAINE/HEADACHE. MAY REPEAT IN 2 HOURS IF NEEDED 3 tablet 0   No current facility-administered medications on file prior to visit.     BP (!) 150/90 (BP Location: Right Arm, Patient Position: Sitting)   Pulse 97   Temp 98.4 F (36.9 C) (Oral)   Ht _0  (1.778 m)   Wt 195 lb (88.5 kg)   SpO2 99%   BMI 27.98 kg/m     Review of Systems  Constitutional: Negative for appetite change, chills, fatigue  and fever.  HENT: Negative for congestion, dental problem, ear pain, hearing loss, sore throat, tinnitus, trouble swallowing and voice change.   Eyes: Negative for pain, discharge and visual disturbance.  Respiratory: Negative for cough, chest tightness, wheezing and stridor.   Cardiovascular: Negative for chest pain, palpitations and leg swelling.  Gastrointestinal: Negative for abdominal distention, abdominal pain, blood in stool, constipation, diarrhea, nausea and vomiting.  Genitourinary: Negative for difficulty urinating, discharge, flank pain, genital sores, hematuria and urgency.  Musculoskeletal: Positive for arthralgias and back pain. Negative for gait problem, joint swelling, myalgias and neck stiffness.  Skin: Negative for rash.  Neurological: Negative for dizziness, syncope, speech difficulty, weakness, numbness and headaches.  Hematological: Negative for adenopathy. Does not bruise/bleed easily.  Psychiatric/Behavioral: Negative for behavioral problems and dysphoric mood. The patient is not nervous/anxious.        Objective:   Physical Exam  Constitutional: He is oriented to person, place, and time. He appears well-developed.  Repeat blood pressure 140/88  HENT:  Head: Normocephalic.  Right Ear: External ear normal.  Left Ear: External ear normal.  Eyes: Conjunctivae and EOM are normal.  Neck: Normal range of motion.  Cardiovascular: Normal rate and normal heart sounds.  Pulmonary/Chest: Breath sounds normal.  Abdominal: Bowel  sounds are normal.  Musculoskeletal: Normal range of motion. He exhibits no edema or tenderness.  Neurological: He is alert and oriented to person, place, and time.  Psychiatric: He has a normal mood and affect. His behavior is normal.          Assessment & Plan:  Essential hypertension. Patient was seen 2 months ago with a better blood pressure.  No recent home blood pressure monitoring.  This was encouraged  Chronic low back pain  stable  Anxiety disorder.  Paxil refilled  Follow-up 6 months.  Patient will call if blood pressure consistently greater than 140/85  Nyoka Cowden

## 2018-02-09 DIAGNOSIS — S43431A Superior glenoid labrum lesion of right shoulder, initial encounter: Secondary | ICD-10-CM | POA: Diagnosis not present

## 2018-02-09 DIAGNOSIS — M7581 Other shoulder lesions, right shoulder: Secondary | ICD-10-CM | POA: Diagnosis not present

## 2018-02-09 DIAGNOSIS — M24111 Other articular cartilage disorders, right shoulder: Secondary | ICD-10-CM | POA: Diagnosis not present

## 2018-02-12 DIAGNOSIS — M25511 Pain in right shoulder: Secondary | ICD-10-CM | POA: Diagnosis not present

## 2018-02-16 DIAGNOSIS — M25511 Pain in right shoulder: Secondary | ICD-10-CM | POA: Diagnosis not present

## 2018-02-19 DIAGNOSIS — M25511 Pain in right shoulder: Secondary | ICD-10-CM | POA: Diagnosis not present

## 2018-02-24 ENCOUNTER — Other Ambulatory Visit: Payer: Self-pay | Admitting: Internal Medicine

## 2018-02-24 DIAGNOSIS — M25511 Pain in right shoulder: Secondary | ICD-10-CM | POA: Diagnosis not present

## 2018-02-27 DIAGNOSIS — M25511 Pain in right shoulder: Secondary | ICD-10-CM | POA: Diagnosis not present

## 2018-02-27 DIAGNOSIS — Z79899 Other long term (current) drug therapy: Secondary | ICD-10-CM | POA: Diagnosis not present

## 2018-03-06 DIAGNOSIS — M25511 Pain in right shoulder: Secondary | ICD-10-CM | POA: Diagnosis not present

## 2018-03-07 ENCOUNTER — Ambulatory Visit (INDEPENDENT_AMBULATORY_CARE_PROVIDER_SITE_OTHER): Payer: 59 | Admitting: Family Medicine

## 2018-03-07 ENCOUNTER — Encounter: Payer: Self-pay | Admitting: Family Medicine

## 2018-03-07 VITALS — BP 154/96 | HR 74 | Temp 98.9°F | Wt 193.0 lb

## 2018-03-07 DIAGNOSIS — J40 Bronchitis, not specified as acute or chronic: Secondary | ICD-10-CM | POA: Diagnosis not present

## 2018-03-07 MED ORDER — BENZONATATE 200 MG PO CAPS: 200.0000 mg | ORAL_CAPSULE | Freq: Two times a day (BID) | ORAL | 0 refills | Status: DC | PRN

## 2018-03-07 MED ORDER — CLARITHROMYCIN ER 500 MG PO TB24: 1000.0000 mg | ORAL_TABLET | Freq: Every day | ORAL | 0 refills | Status: AC

## 2018-03-07 NOTE — Progress Notes (Signed)
Subjective:  Patient ID: Dustin Henry, male    DOB: [birth year]  Age: 47 y.o. MRN: [id number]  CC: Cough (productive x1 wk, has tried nyquill and tessalon pearles, worse at night)   HPI Dustin Henry presents for 7 day ho st, pnd, cough now prod of purulent phlegm.  Sp recent shoulder surgery.sp spinal fusion surgery.  Outpatient Medications Prior to Visit  Medication Sig Dispense Refill  . gabapentin (NEURONTIN) 300 MG capsule Take 300 mg by mouth 3 (three) times daily.    Marland Kitchen HYDROcodone-acetaminophen (NORCO/VICODIN) 5-325 MG tablet TK 1 T PO  TID PRN  0  . lisinopril-hydrochlorothiazide (PRINZIDE,ZESTORETIC) 20-25 MG tablet TAKE ONE TABLET BY MOUTH DAILY 90 tablet 0  . PARoxetine (PAXIL) 20 MG tablet Take 1 tablet (20 mg total) by mouth every morning. 90 tablet 3  . promethazine (PHENERGAN) 25 MG tablet Take 1 tablet (25 mg total) by mouth every 8 (eight) hours as needed. 30 tablet 0  . SUMAtriptan (IMITREX) 100 MG tablet TAKE 1 TABLET BY MOUTH EVERY 2 HOURS AS NEEDED MIGRAINE/HEADACHE. MAY REPEAT IN 2 HOURS IF NEEDED 3 tablet 0   No facility-administered medications prior to visit.     ROS Review of Systems  Constitutional: Negative for chills, fatigue and fever.  HENT: Positive for sore throat.   Eyes: Negative for photophobia and visual disturbance.  Respiratory: Positive for cough. Negative for shortness of breath and wheezing.   Cardiovascular: Negative.   Endocrine: Negative for polyphagia.  Genitourinary: Negative.   Musculoskeletal: Positive for arthralgias and back pain.  Skin: Negative for color change and rash.  Neurological: Positive for headaches. Negative for weakness.  Hematological: Does not bruise/bleed easily.  Psychiatric/Behavioral: Negative.     Objective:  BP (!) 154/96 (BP Location: Left Arm, Patient Position: Sitting, Cuff Size: Normal)   Pulse 74   Temp 98.9 F (37.2 C) (Oral)   Wt 193 lb (87.5 kg)   SpO2 97%   BMI 27.69 kg/m   BP  Readings from Last 3 Encounters:  03/07/18 (!) 154/96  02/03/18 (!) 150/90  01/13/18 120/90    Wt Readings from Last 3 Encounters:  03/07/18 193 lb (87.5 kg)  02/03/18 195 lb (88.5 kg)  01/13/18 193 lb (87.5 kg)    Physical Exam  Constitutional: He is oriented to person, place, and time. He appears well-developed and well-nourished. No distress.  HENT:  Head: Normocephalic and atraumatic.  Right Ear: External ear normal.  Left Ear: External ear normal.  Mouth/Throat: Oropharynx is clear and moist.  Eyes: Pupils are equal, round, and reactive to light. Conjunctivae are normal. Right eye exhibits no discharge. Left eye exhibits no discharge. No scleral icterus.  Neck: Neck supple. No JVD present. No tracheal deviation present. No thyromegaly present.  Cardiovascular: Normal rate, regular rhythm and normal heart sounds.  Pulmonary/Chest: Effort normal and breath sounds normal. No stridor. No respiratory distress. He has no wheezes. He has no rales.  Lymphadenopathy:    He has no cervical adenopathy.  Neurological: He is alert and oriented to person, place, and time.  Skin: No rash noted. He is not diaphoretic.  Psychiatric: He has a normal mood and affect. His behavior is normal.    Lab Results  Component Value Date   WBC 4.2 11/17/2015   HGB 14.5 11/17/2015   HCT 42.4 11/17/2015   PLT 229 11/17/2015   GLUCOSE 138 (H) 11/17/2015   CHOL 227 (H) 02/09/2010   TRIG 130.0 02/09/2010   HDL 42.60  02/09/2010   LDLDIRECT 174.4 02/09/2010   ALT 33 09/04/2010   AST 33 09/04/2010   NA 139 11/17/2015   K 3.9 11/17/2015   CL 106 11/17/2015   CREATININE 0.96 06/14/2016   BUN 12 06/14/2016   CO2 23 11/17/2015   TSH 0.68 02/09/2010   INR 1.0 04/19/2009    Mr Brain W HG Contrast  Result Date: 07/01/2016 CLINICAL DATA:  Headache and visual problems for 3 weeks. Query pseudotumor cerebri. EXAM: MRI HEAD WITHOUT AND WITH CONTRAST TECHNIQUE: Multiplanar, multiecho pulse sequences of the  brain and surrounding structures were obtained without and with intravenous contrast. CONTRAST:  32mL MULTIHANCE GADOBENATE DIMEGLUMINE 529 MG/ML IV SOLN COMPARISON:  None. FINDINGS: No evidence for acute infarction, hemorrhage, mass lesion, hydrocephalus, or extra-axial fluid. Normal cerebral volume. Scattered punctate T2 and FLAIR hyperintensities in the subcortical greater than periventricular white matter, sparing the brainstem and cerebellum, are nonspecific. Considerations include chronic microvascular ischemic change, complicated migraine, chronic infection, vasculitis, or demyelinating disease. Normal pituitary. Mild tonsillar ectopia. No cervicomedullary compression or frank tonsillar herniation. Upper cervical region unremarkable. Flow voids are maintained throughout the carotid, basilar, and vertebral arteries. There are no areas of chronic hemorrhage. The study was not performed as a dedicated orbital examination. Within limits for detection on routine brain MR, no definite imaging findings to confirm papilledema. Post infusion, no abnormal enhancement of the brain or meninges. Major dural venous sinuses are patent. Extracranial soft tissues are unremarkable. IMPRESSION: Scattered nonspecific white matter foci as described above. Correlate clinically. No acute intracranial findings. No abnormal postcontrast enhancement. Electronically Signed   By: Staci Righter M.D.   On: 07/01/2016 17:46   Assessment & Plan:   Dustin Henry was seen today for cough.  Diagnoses and all orders for this visit:  Bronchitis -     clarithromycin (BIAXIN XL) 500 MG 24 hr tablet; Take 2 tablets (1,000 mg total) by mouth daily for 10 days. -     benzonatate (TESSALON) 200 MG capsule; Take 1 capsule (200 mg total) by mouth 2 (two) times daily as needed for cough.   I am having Dustin Henry. Dustin Henry start on clarithromycin and benzonatate. I am also having him maintain his HYDROcodone-acetaminophen, gabapentin,  SUMAtriptan, PARoxetine, promethazine, and lisinopril-hydrochlorothiazide.  Meds ordered this encounter  Medications  . clarithromycin (BIAXIN XL) 500 MG 24 hr tablet    Sig: Take 2 tablets (1,000 mg total) by mouth daily for 10 days.    Dispense:  20 tablet    Refill:  0  . benzonatate (TESSALON) 200 MG capsule    Sig: Take 1 capsule (200 mg total) by mouth 2 (two) times daily as needed for cough.    Dispense:  20 capsule    Refill:  0   Advised pt to also use a norco at night for cough!  Follow-up: No follow-ups on file.  Libby Maw, MD

## 2018-03-09 HISTORY — PX: SHOULDER SURGERY: SHX246

## 2018-04-20 ENCOUNTER — Other Ambulatory Visit: Payer: Self-pay | Admitting: Internal Medicine

## 2018-04-20 DIAGNOSIS — R110 Nausea: Secondary | ICD-10-CM

## 2018-06-17 ENCOUNTER — Other Ambulatory Visit: Payer: Self-pay | Admitting: Internal Medicine

## 2018-06-17 DIAGNOSIS — R110 Nausea: Secondary | ICD-10-CM

## 2018-08-03 ENCOUNTER — Encounter: Payer: Self-pay | Admitting: Internal Medicine

## 2018-08-03 ENCOUNTER — Ambulatory Visit: Payer: 59 | Admitting: Internal Medicine

## 2018-08-03 VITALS — BP 146/104 | HR 79 | Temp 97.7°F | Wt 200.4 lb

## 2018-08-03 DIAGNOSIS — M545 Low back pain, unspecified: Secondary | ICD-10-CM

## 2018-08-03 DIAGNOSIS — I10 Essential (primary) hypertension: Secondary | ICD-10-CM

## 2018-08-03 DIAGNOSIS — G8929 Other chronic pain: Secondary | ICD-10-CM | POA: Diagnosis not present

## 2018-08-03 MED ORDER — AMLODIPINE BESYLATE 5 MG PO TABS: 5.0000 mg | ORAL_TABLET | Freq: Every day | ORAL | 3 refills | Status: DC

## 2018-08-03 NOTE — Patient Instructions (Addendum)
Limit your sodium (Salt) intake  Please check your blood pressure on a regular basis.  If it is consistently greater than 150/90, please make an office appointment.  Return in 3 weeks for follow-up   DASH Eating Plan DASH stands for "Dietary Approaches to Stop Hypertension." The DASH eating plan is a healthy eating plan that has been shown to reduce high blood pressure (hypertension). It may also reduce your risk for type 2 diabetes, heart disease, and stroke. The DASH eating plan may also help with weight loss. What are tips for following this plan? General guidelines  Avoid eating more than 2,300 mg (milligrams) of salt (sodium) a day. If you have hypertension, you may need to reduce your sodium intake to 1,500 mg a day.  Limit alcohol intake to no more than 1 drink a day for nonpregnant women and 2 drinks a day for men. One drink equals 12 oz of beer, 5 oz of wine, or 1 oz of hard liquor.  Work with your health care provider to maintain a healthy body weight or to lose weight. Ask what an ideal weight is for you.  Get at least 30 minutes of exercise that causes your heart to beat faster (aerobic exercise) most days of the week. Activities may include walking, swimming, or biking.  Work with your health care provider or diet and nutrition specialist (dietitian) to adjust your eating plan to your individual calorie needs. Reading food labels  Check food labels for the amount of sodium per serving. Choose foods with less than 5 percent of the Daily Value of sodium. Generally, foods with less than 300 mg of sodium per serving fit into this eating plan.  To find whole grains, look for the word "whole" as the first word in the ingredient list. Shopping  Buy products labeled as "low-sodium" or "no salt added."  Buy fresh foods. Avoid canned foods and premade or frozen meals. Cooking  Avoid adding salt when cooking. Use salt-free seasonings or herbs instead of table salt or sea salt.  Check with your health care provider or pharmacist before using salt substitutes.  Do not fry foods. Cook foods using healthy methods such as baking, boiling, grilling, and broiling instead.  Cook with heart-healthy oils, such as olive, canola, soybean, or sunflower oil. Meal planning   Eat a balanced diet that includes: ? 5 or more servings of fruits and vegetables each day. At each meal, try to fill half of your plate with fruits and vegetables. ? Up to 6-8 servings of whole grains each day. ? Less than 6 oz of lean meat, poultry, or fish each day. A 3-oz serving of meat is about the same size as a deck of cards. One egg equals 1 oz. ? 2 servings of low-fat dairy each day. ? A serving of nuts, seeds, or beans 5 times each week. ? Heart-healthy fats. Healthy fats called Omega-3 fatty acids are found in foods such as flaxseeds and coldwater fish, like sardines, salmon, and mackerel.  Limit how much you eat of the following: ? Canned or prepackaged foods. ? Food that is high in trans fat, such as fried foods. ? Food that is high in saturated fat, such as fatty meat. ? Sweets, desserts, sugary drinks, and other foods with added sugar. ? Full-fat dairy products.  Do not salt foods before eating.  Try to eat at least 2 vegetarian meals each week.  Eat more home-cooked food and less restaurant, buffet, and fast food.  When  eating at a restaurant, ask that your food be prepared with less salt or no salt, if possible. What foods are recommended? The items listed may not be a complete list. Talk with your dietitian about what dietary choices are best for you. Grains Whole-grain or whole-wheat bread. Whole-grain or whole-wheat pasta. Brown rice. [place]. Bulgur. Whole-grain and low-sodium cereals. Pita bread. Low-fat, low-sodium crackers. Whole-wheat flour tortillas. Vegetables Fresh or frozen vegetables (raw, steamed, roasted, or grilled). Low-sodium or reduced-sodium tomato and  vegetable juice. Low-sodium or reduced-sodium tomato sauce and tomato paste. Low-sodium or reduced-sodium canned vegetables. Fruits All fresh, dried, or frozen fruit. Canned fruit in natural juice (without added sugar). Meat and other protein foods Skinless chicken or [place]. Ground chicken or [place]. Pork with fat trimmed off. Fish and seafood. Egg whites. Dried beans, peas, or lentils. Unsalted nuts, nut butters, and seeds. Unsalted canned beans. Lean cuts of beef with fat trimmed off. Low-sodium, lean deli meat. Dairy Low-fat (1%) or fat-free (skim) milk. Fat-free, low-fat, or reduced-fat cheeses. Nonfat, low-sodium ricotta or cottage cheese. Low-fat or nonfat yogurt. Low-fat, low-sodium cheese. Fats and oils Soft margarine without trans fats. Vegetable oil. Low-fat, reduced-fat, or light mayonnaise and salad dressings (reduced-sodium). Canola, safflower, olive, soybean, and sunflower oils. Avocado. Seasoning and other foods Herbs. Spices. Seasoning mixes without salt. Unsalted popcorn and pretzels. Fat-free sweets. What foods are not recommended? The items listed may not be a complete list. Talk with your dietitian about what dietary choices are best for you. Grains Baked goods made with fat, such as croissants, muffins, or some breads. Dry pasta or rice meal packs. Vegetables Creamed or fried vegetables. Vegetables in a cheese sauce. Regular canned vegetables (not low-sodium or reduced-sodium). Regular canned tomato sauce and paste (not low-sodium or reduced-sodium). Regular tomato and vegetable juice (not low-sodium or reduced-sodium). Angie Fava. Olives. Fruits Canned fruit in a light or heavy syrup. Fried fruit. Fruit in cream or butter sauce. Meat and other protein foods Fatty cuts of meat. Ribs. Fried meat. Berniece Salines. Sausage. Bologna and other processed lunch meats. Salami. Fatback. Hotdogs. Bratwurst. Salted nuts and seeds. Canned beans with added salt. Canned or smoked fish. Whole eggs or  egg yolks. Chicken or [place] with skin. Dairy Whole or 2% milk, cream, and half-and-half. Whole or full-fat cream cheese. Whole-fat or sweetened yogurt. Full-fat cheese. Nondairy creamers. Whipped toppings. Processed cheese and cheese spreads. Fats and oils Butter. Stick margarine. Lard. Shortening. Ghee. Bacon fat. Tropical oils, such as coconut, palm kernel, or palm oil. Seasoning and other foods Salted popcorn and pretzels. Onion salt, garlic salt, seasoned salt, table salt, and sea salt. Worcestershire sauce. Tartar sauce. Barbecue sauce. Teriyaki sauce. Soy sauce, including reduced-sodium. Steak sauce. Canned and packaged gravies. Fish sauce. Oyster sauce. Cocktail sauce. Horseradish that you find on the shelf. Ketchup. Mustard. Meat flavorings and tenderizers. Bouillon cubes. Hot sauce and Tabasco sauce. Premade or packaged marinades. Premade or packaged taco seasonings. Relishes. Regular salad dressings. Where to find more information:  National Heart, Lung, and [place]: https://wilson-eaton.com/  American Heart Association: www.heart.org Summary  The DASH eating plan is a healthy eating plan that has been shown to reduce high blood pressure (hypertension). It may also reduce your risk for type 2 diabetes, heart disease, and stroke.  With the DASH eating plan, you should limit salt (sodium) intake to 2,300 mg a day. If you have hypertension, you may need to reduce your sodium intake to 1,500 mg a day.  When on the DASH eating plan, aim  to eat more fresh fruits and vegetables, whole grains, lean proteins, low-fat dairy, and heart-healthy fats.  Work with your health care provider or diet and nutrition specialist (dietitian) to adjust your eating plan to your individual calorie needs. This information is not intended to replace advice given to you by your health care provider. Make sure you discuss any questions you have with your health care provider. Document Released: 11/14/2011  Document Revised: 11/18/2016 Document Reviewed: 11/18/2016 Elsevier Interactive Patient Education  [employer].

## 2018-08-03 NOTE — Progress Notes (Signed)
Subjective:    Patient ID: Dustin Henry, male    DOB: 1971-05-24, 47 y.o.   MRN: [id number]  HPI  47 year old patient who is seen today for his six-month follow-up. He has a history of essential hypertension.  No home blood pressure monitoring but he states with physician office visits blood pressure has been controlled. Blood pressure was slightly high last visit Since his last visit here he has had right shoulder surgery. Continues to be followed by chronic pain management for low back pain.  He is scheduled for an epidural next month.  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 CYSTOSCOPY    . FINGER SURGERY    . POSTERIOR CERVICAL FUSION/FORAMINOTOMY N/A 11/22/2015   Procedure: TLIF L5-S1;  Surgeon: Melina Schools, MD;  Location: [facility];  Service: Orthopedics;  Laterality: N/A;  . SHOULDER SURGERY Right 03/2018   Dr. Veverly Fells        Current Outpatient Medications on File Prior to Visit  Medication Sig Dispense Refill  . gabapentin (NEURONTIN) 800 MG tablet Take 1 tablet by mouth 3 (three) times daily.  2  . HYDROcodone-acetaminophen (NORCO/VICODIN) 5-325 MG tablet TK 1 T PO  TID PRN  0  . lisinopril-hydrochlorothiazide (PRINZIDE,ZESTORETIC) 20-25 MG tablet TAKE ONE TABLET BY MOUTH DAILY 90 tablet 0  . PARoxetine (PAXIL) 20 MG tablet Take 1 tablet (20 mg total) by mouth every morning. 90 tablet 3  . promethazine (PHENERGAN) 25 MG tablet TAKE 1 TABLET(25 MG) BY MOUTH EVERY 8 HOURS AS NEEDED 30 tablet 0  . SUMAtriptan (IMITREX) 100 MG tablet TAKE 1 TABLET BY MOUTH EVERY 2 HOURS AS NEEDED MIGRAINE/HEADACHE. MAY REPEAT IN 2 HOURS IF NEEDED 3 tablet 0   No current facility-administered medications on file prior to visit.     BP (!) 146/104 (BP Location: Right Arm, Patient Position: Sitting, Cuff Size: Normal)   Pulse 79   Temp 97.7  F (36.5 C) (Oral)   Wt 200 lb 6.4 oz (90.9 kg)   SpO2 98%   BMI 28.75 kg/m     Review of Systems  Constitutional: Negative for appetite change, chills, fatigue and fever.  HENT: Negative for congestion, dental problem, ear pain, hearing loss, sore throat, tinnitus, trouble swallowing and voice change.   Eyes: Negative for pain, discharge and visual disturbance.  Respiratory: Negative for cough, chest tightness, wheezing and stridor.   Cardiovascular: Negative for chest pain, palpitations and leg swelling.  Gastrointestinal: Negative for abdominal distention, abdominal pain, blood in stool, constipation, diarrhea, nausea and vomiting.  Genitourinary: Negative for difficulty urinating, discharge, flank pain, genital sores, hematuria and urgency.  Musculoskeletal: Positive for back pain. Negative for arthralgias, gait problem, joint swelling, myalgias and neck stiffness.  Skin: Negative for rash.  Neurological: Negative for dizziness, syncope, speech difficulty, weakness, numbness and headaches.  Hematological: Negative for adenopathy. Does not bruise/bleed easily.  Psychiatric/Behavioral: Negative for behavioral problems and dysphoric mood. The patient is not nervous/anxious.        Objective:   Physical Exam  Constitutional: He is oriented to person, place, and time. He appears well-developed.  Blood pressure is high as 176/96  HENT:  Head: Normocephalic.  Right Ear: External ear normal.  Left Ear: External ear normal.  Eyes: Conjunctivae and EOM are normal.  Neck: Normal range of motion.  Cardiovascular:  Normal rate and normal heart sounds.  Pulmonary/Chest: Breath sounds normal.  Abdominal: Bowel sounds are normal.  Musculoskeletal: Normal range of motion. He exhibits no edema or tenderness.  Neurological: He is alert and oriented to person, place, and time.  Psychiatric: He has a normal mood and affect. His behavior is normal.          Assessment & Plan:    Essential hypertension.  Poor control Chronic low back pain History of migraines  We will add amlodipine 5 mg daily. DASH diet encouraged Follow-up 3 weeks  Marletta Lor

## 2018-08-14 ENCOUNTER — Other Ambulatory Visit: Payer: Self-pay | Admitting: Internal Medicine

## 2018-08-14 DIAGNOSIS — R110 Nausea: Secondary | ICD-10-CM

## 2018-08-17 ENCOUNTER — Other Ambulatory Visit: Payer: Self-pay

## 2018-08-17 DIAGNOSIS — R110 Nausea: Secondary | ICD-10-CM

## 2018-08-17 MED ORDER — PROMETHAZINE HCL 25 MG PO TABS: ORAL_TABLET | ORAL | 0 refills | Status: DC

## 2018-08-26 ENCOUNTER — Ambulatory Visit: Payer: 59 | Admitting: Internal Medicine

## 2018-08-26 ENCOUNTER — Encounter: Payer: Self-pay | Admitting: Internal Medicine

## 2018-08-26 VITALS — BP 122/78 | HR 92 | Temp 98.3°F | Wt 198.6 lb

## 2018-08-26 DIAGNOSIS — G43009 Migraine without aura, not intractable, without status migrainosus: Secondary | ICD-10-CM | POA: Diagnosis not present

## 2018-08-26 DIAGNOSIS — I10 Essential (primary) hypertension: Secondary | ICD-10-CM | POA: Diagnosis not present

## 2018-08-26 MED ORDER — PREDNISONE 10 MG PO TABS: 10.0000 mg | ORAL_TABLET | Freq: Two times a day (BID) | ORAL | 0 refills | Status: DC

## 2018-08-26 MED ORDER — FLUTICASONE PROPIONATE 50 MCG/ACT NA SUSP: 2.0000 | Freq: Every day | NASAL | 6 refills | Status: DC

## 2018-08-26 NOTE — Progress Notes (Signed)
Subjective:    Patient ID: Dustin Henry, male    DOB: 1971-05-03, 47 y.o.   MRN: [id number]  HPI  47 year old patient who is seen today for follow-up of hypertension.  He was placed on amlodipine last month due to poor blood pressure control.  He remains on combination lisinopril hydrochlorothiazide.  He has tolerated this regimen well blood pressure much better controlled today He also complains of a 2-week history of much more severe allergies.  He does work as a Development worker, international aid.  He has been using Zyrtec only. Since his last visit he has had a spinal epidural  Past Medical History:  Diagnosis Date  . ALLERGIC RHINITIS 08/18/2007  . BACK PAIN    herniated disc  . Depression    takes Paxil daily  . Difficult intubation    09/05/10: difficult airway documented (ant cords vis with head up/cricoid press), MAC 4 used  . History of kidney stones   . HYPERLIPIDEMIA 02/12/2010  . HYPERTENSION    takes Amlodipine and Lisinopril-HCTZ daily  . IBS (irritable bowel syndrome)   . MIGRAINE HEADACHE    takes Imitrex daily as needed  . Nocturia        Past Surgical History:  Procedure Laterality Date  . CYSTOSCOPY    . FINGER SURGERY    . POSTERIOR CERVICAL FUSION/FORAMINOTOMY N/A 11/22/2015   Procedure: TLIF L5-S1;  Surgeon: Melina Schools, MD;  Location: [facility];  Service: Orthopedics;  Laterality: N/A;  . SHOULDER SURGERY Right 03/2018   Dr. Veverly Fells    Family History  Problem Relation Age of Onset  . Heart disease Unknown        No family history      Current Outpatient Medications on File Prior to Visit  Medication Sig Dispense Refill  . amLODipine (NORVASC) 5 MG tablet Take 1 tablet (5 mg total) by mouth daily. 90 tablet 3  . gabapentin (NEURONTIN) 800 MG tablet Take 1 tablet by mouth 3 (three) times daily.  2  . HYDROcodone-acetaminophen (NORCO/VICODIN) 5-325 MG tablet TK 1 T PO  TID PRN  0  . lisinopril-hydrochlorothiazide (PRINZIDE,ZESTORETIC) 20-25 MG tablet TAKE ONE TABLET  BY MOUTH DAILY 90 tablet 0  . PARoxetine (PAXIL) 20 MG tablet Take 1 tablet (20 mg total) by mouth every morning. 90 tablet 3  . promethazine (PHENERGAN) 25 MG tablet TAKE 1 TABLET(25 MG) BY MOUTH EVERY 8 HOURS AS NEEDED 30 tablet 0  . SUMAtriptan (IMITREX) 100 MG tablet TAKE 1 TABLET BY MOUTH EVERY 2 HOURS AS NEEDED MIGRAINE/HEADACHE. MAY REPEAT IN 2 HOURS IF NEEDED 3 tablet 0   No current facility-administered medications on file prior to visit.     BP 122/78 (BP Location: Left Arm, Patient Position: Sitting, Cuff Size: Large)   Pulse 92   Temp 98.3 F (36.8 C) (Oral)   Wt 198 lb 9.6 oz (90.1 kg)   SpO2 97%   BMI 28.50 kg/m   \  Review of Systems  Constitutional: Negative for appetite change, chills, fatigue and fever.  HENT: Positive for congestion, postnasal drip, rhinorrhea and sinus pressure. Negative for dental problem, ear pain, hearing loss, sore throat, tinnitus, trouble swallowing and voice change.   Eyes: Negative for pain, discharge and visual disturbance.  Respiratory: Positive for cough. Negative for chest tightness, wheezing and stridor.   Cardiovascular: Negative for chest pain, palpitations and leg swelling.  Gastrointestinal: Negative for abdominal distention, abdominal pain, blood in stool, constipation, diarrhea, nausea and vomiting.  Genitourinary: Negative for difficulty  urinating, discharge, flank pain, genital sores, hematuria and urgency.  Musculoskeletal: Negative for arthralgias, back pain, gait problem, joint swelling, myalgias and neck stiffness.  Skin: Negative for rash.  Neurological: Negative for dizziness, syncope, speech difficulty, weakness, numbness and headaches.  Hematological: Negative for adenopathy. Does not bruise/bleed easily.  Psychiatric/Behavioral: Negative for behavioral problems and dysphoric mood. The patient is not nervous/anxious.        Objective:   Physical Exam  Constitutional: He appears well-nourished. No distress.  Blood  pressure 122/78  HENT:  Mouth/Throat: Oropharynx is clear and moist.  Cardiovascular: Normal rate and regular rhythm.  Pulmonary/Chest: Effort normal and breath sounds normal.          Assessment & Plan:  Essential hypertension well-controlled Flare allergic rhinitis.  Will start fluticasone nasal spray continue oral antihistamine.  Will treat with 7 days of oral prednisone  Marletta Lor

## 2018-08-26 NOTE — Patient Instructions (Signed)
Limit your sodium (Salt) intake  Please check your blood pressure on a regular basis.  If it is consistently greater than 150/90, please make an office appointment.  BEST WISHES!!

## 2018-09-07 ENCOUNTER — Other Ambulatory Visit: Payer: Self-pay | Admitting: Internal Medicine

## 2018-10-09 ENCOUNTER — Other Ambulatory Visit: Payer: Self-pay | Admitting: Internal Medicine

## 2018-11-30 ENCOUNTER — Other Ambulatory Visit: Payer: Self-pay | Admitting: Internal Medicine

## 2018-11-30 DIAGNOSIS — R110 Nausea: Secondary | ICD-10-CM

## 2018-12-08 ENCOUNTER — Ambulatory Visit: Payer: Self-pay

## 2018-12-08 DIAGNOSIS — R110 Nausea: Secondary | ICD-10-CM

## 2018-12-08 MED ORDER — PROMETHAZINE HCL 25 MG PO TABS: ORAL_TABLET | ORAL | 1 refills | Status: DC

## 2018-12-08 NOTE — Telephone Encounter (Signed)
Pt requested Phenergan on 11/30/18 and has not been refilled. Pt called back today asking why it has not been refilled. Last refill 08/17/2018 #30.    Reason for Disposition . [1] Request for URGENT new prescription or refill of "essential" medication (i.e., likelihood of harm to patient if not taken) AND [2] triager unable to fill per unit policy  Answer Assessment - Initial Assessment Questions 1. REASON FOR CALL or QUESTION: "What is your reason for calling today?" or "How can I best help you?" or "What question do you have that I can help answer?"     Pt requested Phenergan on 11/30/18 and has not been refilled  Protocols used: MEDICATION QUESTION CALL-A-AH, INFORMATION ONLY CALL-A-AH

## 2018-12-08 NOTE — Telephone Encounter (Signed)
Sent to the pharmacy by e-scribe. 

## 2018-12-08 NOTE — Telephone Encounter (Signed)
Pt calling about his request for med refill of the promethazine (PHENERGAN) 25 MG tablet

## 2018-12-08 NOTE — Telephone Encounter (Signed)
Ok to fill 30 + 1

## 2018-12-08 NOTE — Addendum Note (Signed)
Addended by: Miles Costain T on: 12/08/2018 09:56 AM   Modules accepted: Orders

## 2018-12-08 NOTE — Telephone Encounter (Signed)
Pt has appt for transfer scheduled on 02/24/19.  Please advise.

## 2019-01-30 ENCOUNTER — Other Ambulatory Visit: Payer: Self-pay | Admitting: Adult Health

## 2019-01-30 DIAGNOSIS — R110 Nausea: Secondary | ICD-10-CM

## 2019-02-02 NOTE — Telephone Encounter (Signed)
Has TOC appt on 02/24/2019.  Please advise.

## 2019-02-10 ENCOUNTER — Ambulatory Visit: Payer: 59 | Admitting: Adult Health

## 2019-02-10 ENCOUNTER — Encounter: Payer: Self-pay | Admitting: Adult Health

## 2019-02-10 ENCOUNTER — Ambulatory Visit: Payer: Self-pay | Admitting: Adult Health

## 2019-02-10 VITALS — BP 142/90 | Temp 98.0°F | Wt 198.0 lb

## 2019-02-10 DIAGNOSIS — R6889 Other general symptoms and signs: Secondary | ICD-10-CM | POA: Diagnosis not present

## 2019-02-10 LAB — POC INFLUENZA A&B (BINAX/QUICKVUE)
INFLUENZA A, POC: NEGATIVE
INFLUENZA B, POC: NEGATIVE

## 2019-02-10 MED ORDER — OSELTAMIVIR PHOSPHATE 75 MG PO CAPS: 75.0000 mg | ORAL_CAPSULE | Freq: Two times a day (BID) | ORAL | 0 refills | Status: AC

## 2019-02-10 NOTE — Telephone Encounter (Signed)
Pt called in c/o waking up this morning with cold sweats and a scratchy throat along with some nasal congestion.   His wife saw the doctor yesterday (02/09/2019) and her flu test was negative but they think she a strain of the flu so they prescribed her Tamiflu.   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   He is requesting Tamiflu since he woke up with symptoms this morning.  I sent a note to Dorothyann Peng, AGNP-C for further disposition.     I let the pt know someone will be calling him with Cory's decision.    The pt is willing to come in if Tommi Rumps wants to see him.   Reason for Disposition . [1] Influenza EXPOSURE within last 48 hours (2 days) AND [2] exposed person is a Public house manager, Publishing copy, or first responder (EMS)  Answer Assessment - Initial Assessment Questions 1. TYPE of EXPOSURE: "How were you exposed?" (e.g., close contact, not a close contact)     My wife.    I woke up this morning with cold sweats.   No cough or sore throat.   No fever.   I checked it and it was normal.   Feeling congested and a scratchy throat.   2. DATE of EXPOSURE: "When did the exposure occur?" (e.g., hour, days, weeks)     Wife diagnosed yesterday.     She went to dr yesterday and flu was negative but dr put her on the Tamiflu.    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 3. PREGNANCY: "Is there any chance you are pregnant?" "When was your last menstrual period?"     N/A 4. HIGH RISK for COMPLICATIONS: "Do you have any heart or lung problems? Do you have a weakened immune system?" (e.g., CHF, COPD, asthma, HIV positive, chemotherapy, renal failure, diabetes mellitus, sickle cell anemia)     No 5. SYMPTOMS: "Do you have any symptoms?" (e.g., cough, fever, sore throat, difficulty breathing).     See above  Protocols used: INFLUENZA EXPOSURE-A-AH

## 2019-02-10 NOTE — Telephone Encounter (Signed)
Spoke to the pt and informed him that he will need an office visit.  He is not able to come in today.  Advised he come in no later than tomorrow if he is still feeling poorly.  Has chills and sweats today.  We cannot treat if more than 48-72 hours from start of sx.  Pt agreed to plan.  Nothing further needed.

## 2019-02-10 NOTE — Telephone Encounter (Signed)
I do not prescribe medication without an office visit

## 2019-02-10 NOTE — Progress Notes (Signed)
Subjective:    Patient ID: Dustin Henry, male    DOB: October 19, 1971, 48 y.o.   MRN: [id number]  Influenza  This is a new problem. The current episode started today. The problem has been gradually worsening. Associated symptoms include arthralgias, chills, diaphoresis, fatigue, nausea, a sore throat and swollen glands. Pertinent negatives include no abdominal pain, chest pain, congestion, coughing, fever, headaches, neck pain, numbness, vomiting or weakness. He has tried nothing for the symptoms. The treatment provided no relief.   No recent travel. His wife was diagnosed with the flu yesterday    Review of Systems  Constitutional: Positive for chills, diaphoresis and fatigue. Negative for fever.  HENT: Positive for sore throat. Negative for congestion.   Respiratory: Negative for cough.   Cardiovascular: Negative for chest pain.  Gastrointestinal: Positive for nausea. Negative for abdominal pain and vomiting.  Musculoskeletal: Positive for arthralgias. Negative for neck pain.  Neurological: Negative for weakness, numbness and headaches.   Past Medical History:  Diagnosis Date  . ALLERGIC RHINITIS 08/18/2007  . BACK PAIN    herniated disc  . Depression    takes Paxil daily  . Difficult intubation    09/05/10: difficult airway documented (ant cords vis with head up/cricoid press), MAC 4 used  . History of kidney stones   . HYPERLIPIDEMIA 02/12/2010  . HYPERTENSION    takes Amlodipine and Lisinopril-HCTZ daily  . IBS (irritable bowel syndrome)   . MIGRAINE HEADACHE    takes Imitrex daily as needed  . Nocturia     Social History   Socioeconomic History  . Marital status: Married    Spouse name: Not on file  . Number of children: 1  . Years of education: Not on file  . Highest education level: Not on file  Occupational History  . Occupation: pressman helper    Comment: Engineer, structural  Social Needs  . Financial resource strain: Not on file  . Food insecurity:    Worry: Not  on file    Inability: Not on file  . Transportation needs:    Medical: Not on file    Non-medical: Not on file  Tobacco Use  . Smoking status: Former Smoker    Packs/day: 3.00    Years: 10.00    Pack years: 30.00    Types: Cigarettes  . Smokeless tobacco: Never Used  . Tobacco comment: quit smoking about 79yr ago  Substance and Sexual Activity  . Alcohol use: Yes    Comment: occasionally  . Drug use: No  . Sexual activity: Not on file  Lifestyle  . Physical activity:    Days per week: Not on file    Minutes per session: Not on file  . Stress: Not on file  Relationships  . Social connections:    Talks on phone: Not on file    Gets together: Not on file    Attends religious service: Not on file    Active member of club or organization: Not on file    Attends meetings of clubs or organizations: Not on file    Relationship status: Not on file  . Intimate partner violence:    Fear of current or ex partner: Not on file    Emotionally abused: Not on file    Physically abused: Not on file    Forced sexual activity: Not on file  Other Topics Concern  . Not on file  Social History Narrative  . Not on file    Past Surgical History:  Procedure Laterality Date  . CYSTOSCOPY    . FINGER SURGERY    . POSTERIOR CERVICAL FUSION/FORAMINOTOMY N/A 11/22/2015   Procedure: TLIF L5-S1;  Surgeon: Melina Schools, MD;  Location: [facility];  Service: Orthopedics;  Laterality: N/A;  . SHOULDER SURGERY Right 03/2018   Dr. Veverly Fells    Family History  Problem Relation Age of Onset  . Heart disease Unknown        No family history    Current Outpatient Medications on File Prior to Visit  Medication Sig Dispense Refill  . amLODipine (NORVASC) 5 MG tablet Take 1 tablet (5 mg total) by mouth daily. 90 tablet 3  . gabapentin (NEURONTIN) 800 MG tablet Take 1 tablet by mouth 3 (three) times daily.  2  . HYDROcodone-acetaminophen (NORCO/VICODIN) 5-325 MG tablet TK 1 T PO  TID PRN  0  .  lisinopril-hydrochlorothiazide (PRINZIDE,ZESTORETIC) 20-25 MG tablet TAKE ONE TABLET BY MOUTH DAILY 90 tablet 0  . PARoxetine (PAXIL) 20 MG tablet Take 1 tablet (20 mg total) by mouth every morning. 90 tablet 3  . promethazine (PHENERGAN) 25 MG tablet TAKE 1 TABLET(25 MG) BY MOUTH EVERY 8 HOURS AS NEEDED 30 tablet 1  . SUMAtriptan (IMITREX) 100 MG tablet TAKE 1 TABLET BY MOUTH EVERY 2 HOURS AS NEEDED MIGRAINE/HEADACHE. MAY REPEAT IN 2 HOURS IF NEEDED 3 tablet 0   No current facility-administered medications on file prior to visit.     BP (!) 142/90   Temp 98 F (36.7 C)   Wt 198 lb (89.8 kg)   BMI 28.41 kg/m       Objective:   Physical Exam Vitals signs and nursing note reviewed.  Constitutional:      Appearance: Normal appearance. He is diaphoretic.  HENT:     Nose: Nose normal. No congestion.     Mouth/Throat:     Mouth: Mucous membranes are moist.     Pharynx: Oropharynx is clear.  Eyes:     Extraocular Movements: Extraocular movements intact.     Pupils: Pupils are equal, round, and reactive to light.  Cardiovascular:     Rate and Rhythm: Normal rate and regular rhythm.     Pulses: Normal pulses.     Heart sounds: Normal heart sounds.  Pulmonary:     Effort: Pulmonary effort is normal. No respiratory distress.     Breath sounds: Normal breath sounds. No stridor. No wheezing, rhonchi or rales.  Chest:     Chest wall: No tenderness.  Skin:    General: Skin is warm.     Capillary Refill: Capillary refill takes less than 2 seconds.  Neurological:     General: No focal deficit present.     Mental Status: He is alert and oriented to person, place, and time. Mental status is at baseline.  Psychiatric:        Mood and Affect: Mood normal.        Behavior: Behavior normal.        Thought Content: Thought content normal.        Judgment: Judgment normal.       Assessment & Plan:  1. Flu-like symptoms - Negative flu test with close contact with wife who was diagnosed  with the flu. We discussed side effects of tamiflu. He will pick up the prescription in the next day if he feels as though he needs it.  - Advised rest, hydration, and tylenol/Motrin every 4-6 hours  - Follow up if symptoms worsen  - POC Influenza  A&B(BINAX/QUICKVUE)- negative  - oseltamivir (TAMIFLU) 75 MG capsule; Take 1 capsule (75 mg total) by mouth 2 (two) times daily for 7 days.  Dispense: 14 capsule; Refill: 0   Dorothyann Peng, NP

## 2019-02-11 ENCOUNTER — Ambulatory Visit: Payer: 59 | Admitting: Adult Health

## 2019-02-24 ENCOUNTER — Ambulatory Visit: Payer: 59 | Admitting: Adult Health

## 2019-02-24 ENCOUNTER — Encounter: Payer: Self-pay | Admitting: Adult Health

## 2019-02-24 ENCOUNTER — Other Ambulatory Visit: Payer: Self-pay

## 2019-02-24 VITALS — BP 140/88 | Temp 98.1°F | Wt 200.0 lb

## 2019-02-24 DIAGNOSIS — E785 Hyperlipidemia, unspecified: Secondary | ICD-10-CM | POA: Diagnosis not present

## 2019-02-24 DIAGNOSIS — Z0000 Encounter for general adult medical examination without abnormal findings: Secondary | ICD-10-CM | POA: Diagnosis not present

## 2019-02-24 DIAGNOSIS — Z23 Encounter for immunization: Secondary | ICD-10-CM | POA: Diagnosis not present

## 2019-02-24 DIAGNOSIS — I10 Essential (primary) hypertension: Secondary | ICD-10-CM | POA: Diagnosis not present

## 2019-02-24 DIAGNOSIS — G43009 Migraine without aura, not intractable, without status migrainosus: Secondary | ICD-10-CM | POA: Diagnosis not present

## 2019-02-24 DIAGNOSIS — Z125 Encounter for screening for malignant neoplasm of prostate: Secondary | ICD-10-CM | POA: Diagnosis not present

## 2019-02-24 LAB — CBC WITH DIFFERENTIAL/PLATELET
Basophils Absolute: 0 10*3/uL (ref 0.0–0.1)
Basophils Relative: 1 % (ref 0.0–3.0)
Eosinophils Absolute: 0.2 10*3/uL (ref 0.0–0.7)
Eosinophils Relative: 4.2 % (ref 0.0–5.0)
HCT: 43.6 % (ref 39.0–52.0)
Hemoglobin: 15.4 g/dL (ref 13.0–17.0)
LYMPHS ABS: 1.2 10*3/uL (ref 0.7–4.0)
Lymphocytes Relative: 24.6 % (ref 12.0–46.0)
MCHC: 35.3 g/dL (ref 30.0–36.0)
MCV: 86.8 fl (ref 78.0–100.0)
MONO ABS: 0.3 10*3/uL (ref 0.1–1.0)
Monocytes Relative: 6.9 % (ref 3.0–12.0)
NEUTROS ABS: 3.1 10*3/uL (ref 1.4–7.7)
NEUTROS PCT: 63.3 % (ref 43.0–77.0)
PLATELETS: 263 10*3/uL (ref 150.0–400.0)
RBC: 5.02 Mil/uL (ref 4.22–5.81)
RDW: 12.9 % (ref 11.5–15.5)
WBC: 4.9 10*3/uL (ref 4.0–10.5)

## 2019-02-24 LAB — COMPREHENSIVE METABOLIC PANEL
ALK PHOS: 51 U/L (ref 39–117)
ALT: 22 U/L (ref 0–53)
AST: 14 U/L (ref 0–37)
Albumin: 4.6 g/dL (ref 3.5–5.2)
BUN: 13 mg/dL (ref 6–23)
CHLORIDE: 102 meq/L (ref 96–112)
CO2: 24 meq/L (ref 19–32)
Calcium: 9.2 mg/dL (ref 8.4–10.5)
Creatinine, Ser: 0.9 mg/dL (ref 0.40–1.50)
GFR: 90.82 mL/min (ref >60.00)
Glucose, Bld: 167 mg/dL — ABNORMAL HIGH (ref 70–99)
POTASSIUM: 3.5 meq/L (ref 3.5–5.1)
SODIUM: 136 meq/L (ref 135–145)
TOTAL PROTEIN: 6.7 g/dL (ref 6.0–8.3)
Total Bilirubin: 0.5 mg/dL (ref 0.2–1.2)

## 2019-02-24 LAB — LIPID PANEL
Cholesterol: 217 mg/dL — ABNORMAL HIGH (ref 0–200)
HDL: 30.2 mg/dL — ABNORMAL LOW (ref >39.00)
Total CHOL/HDL Ratio: 7
Triglycerides: 685 mg/dL — ABNORMAL HIGH (ref 0.0–149.0)

## 2019-02-24 LAB — TSH: TSH: 1.28 u[IU]/mL (ref 0.35–4.50)

## 2019-02-24 LAB — PSA: PSA: 1.26 ng/mL (ref 0.10–4.00)

## 2019-02-24 LAB — LDL CHOLESTEROL, DIRECT: LDL DIRECT: 95 mg/dL

## 2019-02-24 NOTE — Patient Instructions (Signed)
It was great seeing you today   We will follow up with you regarding your blood work   You have received a tetanus booster today and are good for 10 years   Please let me know if you need anything

## 2019-02-24 NOTE — Progress Notes (Signed)
Patient presents to clinic today to establish care. Pleasant 48 year old male who  has a past medical history of ALLERGIC RHINITIS (08/18/2007), BACK PAIN, Depression, Difficult intubation, History of kidney stones, HYPERLIPIDEMIA (02/12/2010), HYPERTENSION, IBS (irritable bowel syndrome), MIGRAINE HEADACHE, and Nocturia.   He is a former patient of Dr. Burnice Logan   Acute Concerns: Establish Care/CPE   Chronic Issues: Essential Hypertension -takes lisinopril/hydrochlorothiazide and Norvasc and he denies chest pain, shortness of breath, dizziness, lightheadedness, headaches, or syncopal episode.  BP Readings from Last 3 Encounters:  02/24/19 140/88  02/10/19 (!) 142/90  08/26/18 122/78   Anxiety - Well controlled on Paxil   Migraines - Takes Imitrex as needed. Last was 6 months ago.   Osteoarthritis - worse in back and shoulders. Is seen by Dr. Nelva Bush. Takes Gabapentin and Norco.    Health Maintenance: Dental -- Routine  Vision -- Routine  Immunizations -- needs Tetanus  Colonoscopy -- Never had  Diet: Tries to eat healthy  Exercise: Finds it hard to exercise d/t arthritic pain. Is active with work    Past Medical History:  Diagnosis Date  . ALLERGIC RHINITIS 08/18/2007  . BACK PAIN    herniated disc  . Depression    takes Paxil daily  . Difficult intubation    09/05/10: difficult airway documented (ant cords vis with head up/cricoid press), MAC 4 used  . History of kidney stones   . HYPERLIPIDEMIA 02/12/2010  . HYPERTENSION    takes Amlodipine and Lisinopril-HCTZ daily  . IBS (irritable bowel syndrome)   . MIGRAINE HEADACHE    takes Imitrex daily as needed  . Nocturia     Past Surgical History:  Procedure Laterality Date  . CYSTOSCOPY    . FINGER SURGERY    . POSTERIOR CERVICAL FUSION/FORAMINOTOMY N/A 11/22/2015   Procedure: TLIF L5-S1;  Surgeon: Melina Schools, MD;  Location: [facility];  Service: Orthopedics;  Laterality: N/A;  . SHOULDER SURGERY Right 03/2018   Dr.  Veverly Fells    Current Outpatient Medications on File Prior to Visit  Medication Sig Dispense Refill  . amLODipine (NORVASC) 5 MG tablet Take 1 tablet (5 mg total) by mouth daily. 90 tablet 3  . gabapentin (NEURONTIN) 800 MG tablet Take 1 tablet by mouth 3 (three) times daily.  2  . HYDROcodone-acetaminophen (NORCO/VICODIN) 5-325 MG tablet TK 1 T PO  TID PRN  0  . lisinopril-hydrochlorothiazide (PRINZIDE,ZESTORETIC) 20-25 MG tablet TAKE ONE TABLET BY MOUTH DAILY 90 tablet 0  . PARoxetine (PAXIL) 20 MG tablet Take 1 tablet (20 mg total) by mouth every morning. 90 tablet 3  . promethazine (PHENERGAN) 25 MG tablet TAKE 1 TABLET(25 MG) BY MOUTH EVERY 8 HOURS AS NEEDED 30 tablet 1  . SUMAtriptan (IMITREX) 100 MG tablet TAKE 1 TABLET BY MOUTH EVERY 2 HOURS AS NEEDED MIGRAINE/HEADACHE. MAY REPEAT IN 2 HOURS IF NEEDED 3 tablet 0   No current facility-administered medications on file prior to visit.       Family History  Problem Relation Age of Onset  . Heart disease Unknown        No family history    Social History   Socioeconomic History  . Marital status: Married    Spouse name: Not on file  . Number of children: 1  . Years of education: Not on file  . Highest education level: Not on file  Occupational History  . Occupation: pressman helper    Comment: Engineer, structural  Social Needs  . Emergency planning/management officer  strain: Not on file  . Food insecurity:    Worry: Not on file    Inability: Not on file  . Transportation needs:    Medical: Not on file    Non-medical: Not on file  Tobacco Use  . Smoking status: Former Smoker    Packs/day: 3.00    Years: 10.00    Pack years: 30.00    Types: Cigarettes  . Smokeless tobacco: Never Used  . Tobacco comment: quit smoking about 49yr ago  Substance and Sexual Activity  . Alcohol use: Yes    Comment: occasionally  . Drug use: No  . Sexual activity: Not on file  Lifestyle  . Physical activity:    Days per week: Not on file    Minutes per  session: Not on file  . Stress: Not on file  Relationships  . Social connections:    Talks on phone: Not on file    Gets together: Not on file    Attends religious service: Not on file    Active member of club or organization: Not on file    Attends meetings of clubs or organizations: Not on file    Relationship status: Not on file  . Intimate partner violence:    Fear of current or ex partner: Not on file    Emotionally abused: Not on file    Physically abused: Not on file    Forced sexual activity: Not on file  Other Topics Concern  . Not on file  Social History Narrative  . Not on file    Review of Systems  Constitutional: Negative.   HENT: Negative.   Eyes: Negative.   Respiratory: Negative.   Cardiovascular: Negative.   Gastrointestinal: Negative.   Genitourinary: Negative.   Musculoskeletal: Positive for back pain.  Skin: Negative.   Neurological: Negative.   Endo/Heme/Allergies: Negative.   Psychiatric/Behavioral: Negative.   All other systems reviewed and are negative.      BP 140/88   Temp 98.1 F (36.7 C)   Wt 200 lb (90.7 kg)   BMI 28.70 kg/m   Physical Exam Vitals signs and nursing note reviewed.  Constitutional:      General: He is not in acute distress.    Appearance: Normal appearance. He is well-developed and normal weight. He is not diaphoretic.  HENT:     Head: Normocephalic and atraumatic.     Right Ear: Tympanic membrane, ear canal and external ear normal. There is no impacted cerumen.     Left Ear: Tympanic membrane, ear canal and external ear normal.     Nose: Nose normal. No congestion.     Mouth/Throat:     Mouth: Mucous membranes are moist.     Pharynx: Oropharynx is clear. No oropharyngeal exudate or posterior oropharyngeal erythema.  Eyes:     General:        Right eye: No discharge.        Left eye: No discharge.     Conjunctiva/sclera: Conjunctivae normal.     Pupils: Pupils are equal, round, and reactive to light.  Neck:      Musculoskeletal: Normal range of motion and neck supple.     Thyroid: No thyromegaly.     Trachea: No tracheal deviation.  Cardiovascular:     Rate and Rhythm: Normal rate and regular rhythm.     Heart sounds: Normal heart sounds. No murmur. No friction rub. No gallop.   Pulmonary:     Effort: Pulmonary effort is normal. No  respiratory distress.     Breath sounds: Normal breath sounds. No stridor. No wheezing, rhonchi or rales.  Chest:     Chest wall: No tenderness.  Abdominal:     General: Bowel sounds are normal. There is no distension.     Palpations: Abdomen is soft. There is no mass.     Tenderness: There is no abdominal tenderness. There is no right CVA tenderness, left CVA tenderness, guarding or rebound.     Hernia: No hernia is present.  Musculoskeletal: Normal range of motion.        General: Tenderness (bilateral shoulders and low back ) present. No swelling, deformity or signs of injury.     Right lower leg: No edema.     Left lower leg: No edema.  Lymphadenopathy:     Cervical: No cervical adenopathy.  Skin:    General: Skin is warm and dry.     Capillary Refill: Capillary refill takes less than 2 seconds.     Coloration: Skin is not pale.     Findings: No erythema or rash.  Neurological:     General: No focal deficit present.     Mental Status: He is alert and oriented to person, place, and time. Mental status is at baseline.     Cranial Nerves: No cranial nerve deficit.     Motor: No weakness.     Coordination: Coordination normal.     Gait: Gait normal.     Deep Tendon Reflexes: Reflexes normal.  Psychiatric:        Mood and Affect: Mood normal.        Behavior: Behavior normal.        Thought Content: Thought content normal.        Judgment: Judgment normal.     Assessment/Plan: 1. Routine general medical examination at a health care facility - Encouraged weight loss  - Follow up in one year or sooner if needed - CBC with Differential/Platelet -  Comprehensive metabolic panel - Lipid panel - TSH  2. Dyslipidemia - Consider statin  - CBC with Differential/Platelet - Comprehensive metabolic panel - Lipid panel - TSH  3. Essential hypertension - Work on weight loss Monitor BP at home.  - CBC with Differential/Platelet - Comprehensive metabolic panel - Lipid panel - TSH  4. Migraine without aura and without status migrainosus, not intractable - Imitrex PRN   5. Prostate cancer screening  - PSA  6. Need for tetanus booster  - Tdap vaccine greater than or equal to 7yo IM  Dorothyann Peng, NP

## 2019-02-25 ENCOUNTER — Other Ambulatory Visit: Payer: Self-pay | Admitting: Family Medicine

## 2019-02-25 MED ORDER — ATORVASTATIN CALCIUM 40 MG PO TABS: 40.0000 mg | ORAL_TABLET | Freq: Every day | ORAL | 3 refills | Status: DC

## 2019-03-04 ENCOUNTER — Other Ambulatory Visit: Payer: Self-pay | Admitting: Adult Health

## 2019-04-27 DIAGNOSIS — M5416 Radiculopathy, lumbar region: Secondary | ICD-10-CM | POA: Diagnosis not present

## 2019-08-03 ENCOUNTER — Other Ambulatory Visit: Payer: Self-pay

## 2019-08-03 ENCOUNTER — Telehealth (INDEPENDENT_AMBULATORY_CARE_PROVIDER_SITE_OTHER): Payer: 59 | Admitting: Adult Health

## 2019-08-03 DIAGNOSIS — R197 Diarrhea, unspecified: Secondary | ICD-10-CM

## 2019-08-03 DIAGNOSIS — R509 Fever, unspecified: Secondary | ICD-10-CM

## 2019-08-03 DIAGNOSIS — R110 Nausea: Secondary | ICD-10-CM | POA: Diagnosis not present

## 2019-08-03 MED ORDER — DICYCLOMINE HCL 10 MG PO CAPS: 10.0000 mg | ORAL_CAPSULE | Freq: Three times a day (TID) | ORAL | 0 refills | Status: DC

## 2019-08-03 NOTE — Progress Notes (Signed)
Virtual Visit via Telephone Note  I connected with [facility] [employer] on 08/03/19 at  3:00 PM EDT by telephone and verified that I am speaking with the correct person using two identifiers.   I discussed the limitations, risks, security and privacy concerns of performing an evaluation and management service by telephone and the availability of in person appointments. I also discussed with the patient that there may be a patient responsible charge related to this service. The patient expressed understanding and agreed to proceed.  Location patient: home Location provider: work or home office Participants present for the call: patient, provider Patient did not have a visit in the prior 7 days to address this/these issue(s).   History of Present Illness: 48 year old male who is being evaluated today for an acute issue.  Symptoms started 3 days ago.  Reports woke up Sunday morning and had diarrhea all day, this is continued throughout today.  He started feeling fatigued yesterday and then checked his temperature today and noticed that he had a low-grade temp of 99.5.  He reports that whenever he eats or drinks something he has diarrhea about 10 minutes later.  Associated symptoms include nausea and abdominal cramping.  He denies vomiting.  Does not believe he has been exposed to [place] but his wife works as a Merchandiser, retail and he also delivers medications for a Management consultant.  He has been trying to stay hydrated as well as taking Imodium and Phenergan.  Reports no improvement with Imodium but does have improvement with Phenergan  He does not believe he ate anything suspicious   Observations/Objective: Patient sounds cheerful and well on the phone. I do not appreciate any SOB. Speech and thought processing are grossly intact. Patient reported vitals:  Assessment and Plan: We will have him get tested for COVID-19.  Sent in Bentyl to help with diarrhea and abdominal cramping, not rule out  infectious process at this time.  He was advised to follow-up in 2 days if no improvement.  Brat diet and make sure he is staying hydrated  Follow Up Instructions:  I did not refer this patient for an OV in the next 24 hours for this/these issue(s).  I discussed the assessment and treatment plan with the patient. The patient was provided an opportunity to ask questions and all were answered. The patient agreed with the plan and demonstrated an understanding of the instructions.   The patient was advised to call back or seek an in-person evaluation if the symptoms worsen or if the condition fails to improve as anticipated.  I provided 20 minutes of non-face-to-face time during this encounter.   Dorothyann Peng, NP

## 2019-08-04 ENCOUNTER — Other Ambulatory Visit: Payer: Self-pay

## 2019-08-04 DIAGNOSIS — Z20822 Contact with and (suspected) exposure to covid-19: Secondary | ICD-10-CM

## 2019-08-05 LAB — NOVEL CORONAVIRUS, NAA: SARS-CoV-2, NAA: NOT DETECTED

## 2019-08-19 ENCOUNTER — Other Ambulatory Visit: Payer: Self-pay | Admitting: Family Medicine

## 2019-08-19 DIAGNOSIS — R110 Nausea: Secondary | ICD-10-CM

## 2019-08-19 MED ORDER — PROMETHAZINE HCL 25 MG PO TABS: ORAL_TABLET | ORAL | 1 refills | Status: DC

## 2019-08-19 NOTE — Telephone Encounter (Signed)
Left a message to see if pt needs a refill.

## 2019-08-19 NOTE — Telephone Encounter (Signed)
Patient called back stating that he needs a refill on the below medication.   Please send to [facility] on [address]

## 2019-08-26 ENCOUNTER — Other Ambulatory Visit: Payer: Self-pay | Admitting: Cardiology

## 2019-08-26 DIAGNOSIS — Z20822 Contact with and (suspected) exposure to covid-19: Secondary | ICD-10-CM

## 2019-08-28 LAB — NOVEL CORONAVIRUS, NAA: SARS-CoV-2, NAA: NOT DETECTED

## 2019-09-07 ENCOUNTER — Other Ambulatory Visit: Payer: Self-pay | Admitting: Adult Health

## 2019-09-07 DIAGNOSIS — F411 Generalized anxiety disorder: Secondary | ICD-10-CM

## 2019-09-08 NOTE — Telephone Encounter (Signed)
Left a message for a return call.

## 2019-09-08 NOTE — Telephone Encounter (Signed)
CALL PT TO DISCUSS. LAST FILLED FOR 1 YEAR ON 02/03/2018.

## 2019-09-08 NOTE — Telephone Encounter (Signed)
Sent to the pharmacy by e-scribe. 

## 2019-09-08 NOTE — Telephone Encounter (Signed)
Pt called and stated that he had a stock pile of medication. Pt states that he has not had this medication in a month. Pt states that he is starting to get headaches again. Pt would like a call back. Pt states that his phone is messing up and can not get call sometimes so please try more than once.

## 2019-11-15 ENCOUNTER — Other Ambulatory Visit: Payer: Self-pay | Admitting: Adult Health

## 2019-11-15 NOTE — Telephone Encounter (Signed)
Requested medication (s) are due for refill today: yes  Requested medication (s) are on the active medication list: yes  Last refill:  09/07/2018  Future visit scheduled: no  Notes to clinic: overdue for office visit   Requested Prescriptions  Pending Prescriptions Disp Refills   SUMAtriptan (IMITREX) 100 MG tablet 3 tablet 0    Sig: May repeat in 2 hours if headache persists or recurs.     Neurology:  Migraine Therapy - Triptan Failed - 11/15/2019 11:17 AM      Failed - Last BP in normal range    BP Readings from Last 1 Encounters:  02/24/19 140/88         Passed - Valid encounter within last 12 months    Recent Outpatient Visits          3 months ago Nausea   [facility], NP   8 months ago Routine general medical examination at a health care facility   [facility], [place], NP   9 months ago Flu-like symptoms   [facility]rap[facility], [place], NP   1 year ago Essential hypertension   [facility]rap[facility] at [facility], [place]tha Sou, MD   1 year ago Essential hypertension   Therap[facility] at [facility], Doretha Sou, MD

## 2019-11-15 NOTE — Telephone Encounter (Signed)
Medication Refill - Medication: SUMAtriptan (IMITREX) 100 MG tablet  Has the patient contacted their pharmacy? Yes.   (Agent: If no, request that the patient contact the pharmacy for the refill.) (Agent: If yes, when and what did the pharmacy advise?)  Preferred Pharmacy (with phone number or street name): [facility], [place]: Please be advised that RX refills may take up to 3 business days. We ask that you follow-up with your pharmacy.

## 2019-11-16 MED ORDER — SUMATRIPTAN SUCCINATE 100 MG PO TABS: 100.0000 mg | ORAL_TABLET | ORAL | 3 refills | Status: DC | PRN

## 2019-11-22 ENCOUNTER — Other Ambulatory Visit: Payer: Self-pay

## 2019-11-22 DIAGNOSIS — Z20822 Contact with and (suspected) exposure to covid-19: Secondary | ICD-10-CM

## 2019-11-24 LAB — NOVEL CORONAVIRUS, NAA: SARS-CoV-2, NAA: NOT DETECTED

## 2019-12-14 ENCOUNTER — Other Ambulatory Visit: Payer: 59

## 2019-12-24 ENCOUNTER — Ambulatory Visit (INDEPENDENT_AMBULATORY_CARE_PROVIDER_SITE_OTHER): Payer: 59

## 2019-12-24 ENCOUNTER — Other Ambulatory Visit: Payer: Self-pay

## 2019-12-24 ENCOUNTER — Ambulatory Visit (HOSPITAL_COMMUNITY)
Admission: EM | Admit: 2019-12-24 | Discharge: 2019-12-24 | Disposition: A | Discharge status: Home / Self Care | Payer: 59 | Attending: Urgent Care | Admitting: Urgent Care

## 2019-12-24 ENCOUNTER — Encounter (HOSPITAL_COMMUNITY): Payer: Self-pay

## 2019-12-24 DIAGNOSIS — S20212A Contusion of left front wall of thorax, initial encounter: Secondary | ICD-10-CM

## 2019-12-24 DIAGNOSIS — S2232XA Fracture of one rib, left side, initial encounter for closed fracture: Secondary | ICD-10-CM | POA: Diagnosis not present

## 2019-12-24 DIAGNOSIS — S20312A Abrasion of left front wall of thorax, initial encounter: Secondary | ICD-10-CM

## 2019-12-24 DIAGNOSIS — R109 Unspecified abdominal pain: Secondary | ICD-10-CM

## 2019-12-24 DIAGNOSIS — W19XXXA Unspecified fall, initial encounter: Secondary | ICD-10-CM

## 2019-12-24 DIAGNOSIS — R0789 Other chest pain: Secondary | ICD-10-CM

## 2019-12-24 MED ORDER — CYCLOBENZAPRINE HCL 5 MG PO TABS: 5.0000 mg | ORAL_TABLET | Freq: Two times a day (BID) | ORAL | 0 refills | Status: DC | PRN

## 2019-12-24 MED ORDER — MELOXICAM 7.5 MG PO TABS: 7.5000 mg | ORAL_TABLET | Freq: Every day | ORAL | 0 refills | Status: DC

## 2019-12-24 NOTE — ED Provider Notes (Signed)
[place]   MRN: [id number] DOB: 12-Jan-1971  Subjective:   Dustin Henry is a 49 y.o. male presenting for 1 day history of left sided chest and abdominal pain from falling on a space heater.  Patient states that the fall was accidental, tripped over his shoes and landed on the space heater which luckily was not on.  Since then he has had worsening pain along his torso from the top of his chest on the left side down to his mid abdomen.  He says that he has a scratch from the fall as well.  He has had a very difficult time feeling comfortable in any position.  Taking a deep breath worsens his pain.  No current facility-administered medications for this encounter.  Current Outpatient Medications:  .  amLODipine (NORVASC) 5 MG tablet, Take 1 tablet (5 mg total) by mouth daily., Disp: 90 tablet, Rfl: 3 .  atorvastatin (LIPITOR) 40 MG tablet, Take 1 tablet (40 mg total) by mouth daily., Disp: 90 tablet, Rfl: 3 .  dicyclomine (BENTYL) 10 MG capsule, Take 1 capsule (10 mg total) by mouth 4 (four) times daily -  before meals and at bedtime for 10 days., Disp: 40 capsule, Rfl: 0 .  gabapentin (NEURONTIN) 800 MG tablet, Take 1 tablet by mouth 3 (three) times daily., Disp: , Rfl: 2 .  HYDROcodone-acetaminophen (NORCO/VICODIN) 5-325 MG tablet, TK 1 T PO  TID PRN, Disp: , Rfl: 0 .  lisinopril-hydrochlorothiazide (PRINZIDE,ZESTORETIC) 20-25 MG tablet, TAKE 1 TABLET BY MOUTH DAILY, Disp: 90 tablet, Rfl: 0 .  PARoxetine (PAXIL) 20 MG tablet, TAKE 1 TABLET(20 MG) BY MOUTH EVERY MORNING, Disp: 90 tablet, Rfl: 1 .  promethazine (PHENERGAN) 25 MG tablet, Take one tablet every 8 hours as needed, Disp: 30 tablet, Rfl: 1 .  SUMAtriptan (IMITREX) 100 MG tablet, Take 1 tablet (100 mg total) by mouth every 2 (two) hours as needed for migraine. May repeat in 2 hours if headache persists or recurs., Disp: 15 tablet, Rfl: 3   Allergy to doxycycline, penicillin.  Past Medical History:  Diagnosis Date  .  ALLERGIC RHINITIS 08/18/2007  . BACK PAIN    herniated disc  . Depression    takes Paxil daily  . Difficult intubation    09/05/10: difficult airway documented (ant cords vis with head up/cricoid press), MAC 4 used  . History of kidney stones   . HYPERLIPIDEMIA 02/12/2010  . HYPERTENSION    takes Amlodipine and Lisinopril-HCTZ daily  . IBS (irritable bowel syndrome)   . MIGRAINE HEADACHE    takes Imitrex daily as needed  . Nocturia      Past Surgical History:  Procedure Laterality Date  . CYSTOSCOPY    . FINGER SURGERY    . POSTERIOR CERVICAL FUSION/FORAMINOTOMY N/A 11/22/2015   Procedure: TLIF L5-S1;  Surgeon: Melina Schools, MD;  Location: [facility];  Service: Orthopedics;  Laterality: N/A;  . SHOULDER SURGERY Right 03/2018   Dr. Veverly Fells    Family History  Problem Relation Age of Onset  . High blood pressure Father   . Diabetes Father   . Arthritis Father   . Heart disease Other        No family history  . Diabetes Paternal Grandmother     Social History   Tobacco Use  . Smoking status: Former Smoker    Packs/day: 3.00    Years: 10.00    Pack years: 30.00    Types: Cigarettes  . Smokeless tobacco: Never Used  .  Tobacco comment: quit smoking about 73yr ago  Substance Use Topics  . Alcohol use: Yes    Comment: occasionally  . Drug use: No    ROS   Objective:   Vitals: BP (!) 139/103 (BP Location: Right Arm)   Pulse 88   Temp 98.4 F (36.9 C) (Oral)   Resp 18   SpO2 99%   Physical Exam Constitutional:      General: He is not in acute distress.    Appearance: Normal appearance. He is well-developed. He is not ill-appearing, toxic-appearing or diaphoretic.  HENT:     Head: Normocephalic and atraumatic.     Right Ear: External ear normal.     Left Ear: External ear normal.     Nose: Nose normal.     Mouth/Throat:     Mouth: Mucous membranes are moist.     Pharynx: Oropharynx is clear.  Eyes:     General: No scleral icterus.    Extraocular Movements:  Extraocular movements intact.     Pupils: Pupils are equal, round, and reactive to light.  Cardiovascular:     Rate and Rhythm: Normal rate and regular rhythm.     Heart sounds: Normal heart sounds. No murmur. No friction rub. No gallop.   Pulmonary:     Effort: Pulmonary effort is normal. No respiratory distress.     Breath sounds: Normal breath sounds. No stridor. No wheezing, rhonchi or rales.  Chest:     Chest wall: Tenderness (over area outlined) present. No mass, lacerations, deformity, swelling, crepitus or edema.    Neurological:     Mental Status: He is alert and oriented to person, place, and time.  Psychiatric:        Mood and Affect: Mood normal.        Behavior: Behavior normal.        Thought Content: Thought content normal.     DG Ribs Unilateral W/Chest Left  Result Date: 12/24/2019 CLINICAL DATA:  Night landing on radiator on LEFT side, LEFT rib pain and LEFT lateral Peri sternal pain EXAM: LEFT RIBS AND CHEST - 3+ VIEW COMPARISON:  Chest radiograph 09/01/2008 FINDINGS: Normal heart size, mediastinal contours, and pulmonary vascularity. Lungs clear. No infiltrate, pleural effusion or pneumothorax. BBs placed at sites of symptoms at the LEFT chest wall. Osseous mineralization grossly normal. Questionable nondisplaced fracture of the posterolateral LEFT ninth rib. IMPRESSION: Questionable nondisplaced fracture of the posterolateral LEFT ninth rib. No other abnormalities. Electronically Signed   By: MLavonia DanaM.D.   On: 12/24/2019 17:14    Assessment and Plan :   1. Closed fracture of one rib of left side, initial encounter   2. Chest wall pain   3. Fall, initial encounter   4. Abdominal wall pain   5. Abrasion of left chest wall, initial encounter   6. Rib contusion, left, initial encounter     Will manage for left lower rib fracture of the ninth rib.  Discussed report which states that there is questionable nondisplaced fracture and patient wanted to agreement to  manage as rib fracture. Counseled patient on potential for adverse effects with medications prescribed/recommended today, ER and return-to-clinic precautions discussed, patient verbalized understanding.    MJaynee Eagles PA-C 12/24/19 1735

## 2019-12-24 NOTE — ED Triage Notes (Signed)
Last night pt tripped over his shoes an fall  on a space heater and hurt the left side of his chest to his abdominal.

## 2019-12-30 ENCOUNTER — Other Ambulatory Visit: Payer: Self-pay | Admitting: Adult Health

## 2019-12-31 NOTE — Telephone Encounter (Signed)
Patient need to schedule an ov for more refills. 

## 2020-01-13 ENCOUNTER — Encounter: Payer: Self-pay | Admitting: Adult Health

## 2020-01-13 ENCOUNTER — Other Ambulatory Visit: Payer: Self-pay | Admitting: Adult Health

## 2020-01-14 MED ORDER — LISINOPRIL-HYDROCHLOROTHIAZIDE 20-25 MG PO TABS: 1.0000 | ORAL_TABLET | Freq: Every day | ORAL | 0 refills | Status: DC

## 2020-12-12 ENCOUNTER — Encounter: Payer: Self-pay | Admitting: Adult Health

## 2020-12-12 ENCOUNTER — Other Ambulatory Visit: Payer: Self-pay

## 2020-12-12 ENCOUNTER — Ambulatory Visit: Payer: 59 | Admitting: Adult Health

## 2020-12-12 VITALS — BP 190/100 | Temp 98.4°F | Wt 192.0 lb

## 2020-12-12 DIAGNOSIS — G43009 Migraine without aura, not intractable, without status migrainosus: Secondary | ICD-10-CM

## 2020-12-12 DIAGNOSIS — E785 Hyperlipidemia, unspecified: Secondary | ICD-10-CM

## 2020-12-12 DIAGNOSIS — F411 Generalized anxiety disorder: Secondary | ICD-10-CM

## 2020-12-12 DIAGNOSIS — I10 Essential (primary) hypertension: Secondary | ICD-10-CM | POA: Diagnosis not present

## 2020-12-12 MED ORDER — LISINOPRIL-HYDROCHLOROTHIAZIDE 20-25 MG PO TABS: 1.0000 | ORAL_TABLET | Freq: Every day | ORAL | 0 refills | Status: DC

## 2020-12-12 MED ORDER — SUMATRIPTAN SUCCINATE 100 MG PO TABS: 100.0000 mg | ORAL_TABLET | ORAL | 3 refills | Status: DC | PRN

## 2020-12-12 MED ORDER — PAROXETINE HCL 20 MG PO TABS: 20.0000 mg | ORAL_TABLET | Freq: Every day | ORAL | 0 refills | Status: DC

## 2020-12-12 MED ORDER — ATORVASTATIN CALCIUM 40 MG PO TABS: 40.0000 mg | ORAL_TABLET | Freq: Every day | ORAL | 0 refills | Status: DC

## 2020-12-12 MED ORDER — AMLODIPINE BESYLATE 5 MG PO TABS: 5.0000 mg | ORAL_TABLET | Freq: Every day | ORAL | 0 refills | Status: DC

## 2020-12-12 NOTE — Patient Instructions (Addendum)
I am going to put you on you back on your medications   Please follow up in one month for a physical exam   You can call [facility] or [facility] to have them look at the cyst on your neck

## 2020-12-12 NOTE — Progress Notes (Signed)
Subjective:    Patient ID: Dustin Henry, male    DOB: October 09, 1971, 50 y.o.   MRN: [id number]  HPI 50 year old male who  has a past medical history of ALLERGIC RHINITIS (08/18/2007), BACK PAIN, Depression, Difficult intubation, History of kidney stones, HYPERLIPIDEMIA (02/12/2010), HYPERTENSION, IBS (irritable bowel syndrome), MIGRAINE HEADACHE, and Nocturia.  Last seen in arch 2020.  Presents to the office today as he has been out of his medications for multiple months at this point in time.  He has been experiencing extreme anxiety, headaches, and elevated blood pressure readings.  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  Thankfully he does work part-time for [employer] as a Education officer, community but is looking for something that makes more money.  He needs refills of all of his medications  Denies chest pain, shortness of breath, blurred vision.  Review of Systems See HPI   Past Medical History:  Diagnosis Date  . ALLERGIC RHINITIS 08/18/2007  . BACK PAIN    herniated disc  . Depression    takes Paxil daily  . Difficult intubation    09/05/10: difficult airway documented (ant cords vis with head up/cricoid press), MAC 4 used  . History of kidney stones   . HYPERLIPIDEMIA 02/12/2010  . HYPERTENSION    takes Amlodipine and Lisinopril-HCTZ daily  . IBS (irritable bowel syndrome)   . MIGRAINE HEADACHE    takes Imitrex daily as needed  . Nocturia     Social History   Socioeconomic History  . Marital status: Married    Spouse name: Not on file  . Number of children: 1  . Years of education: Not on file  . Highest education level: Not on file  Occupational History  . Occupation: pressman helper    Comment: Engineer, structural  Tobacco Use  . Smoking status: Former Smoker    Packs/day: 3.00    Years: 10.00    Pack years: 30.00    Types: Cigarettes  . Smokeless tobacco:  Never Used  . Tobacco comment: quit smoking about 23yr ago  Substance and Sexual Activity  . Alcohol use: Yes    Comment: occasionally  . Drug use: No  . Sexual activity: Not on file  Other Topics Concern  . Not on file  Social History Narrative  . Not on file   Social Determinants of Health   Financial Resource Strain: Not on file  Food Insecurity: Not on file  Transportation Needs: Not on file  Physical Activity: Not on file  Stress: Not on file  Social Connections: Not on file  Intimate Partner Violence: Not on file    Past Surgical History:  Procedure Laterality Date  . CYSTOSCOPY    . FINGER SURGERY    . POSTERIOR CERVICAL FUSION/FORAMINOTOMY N/A 11/22/2015   Procedure: TLIF L5-S1;  Surgeon: DMelina Schools MD;  [place]  Service: Orthopedics;  Laterality: N/A;  . SHOULDER SURGERY Right 03/2018   Dr. NVeverly Fells   Family History  Problem Relation Age of Onset  . High blood pressure Father   . Diabetes Father   . Arthritis Father   . Heart disease Other        No family history  . Diabetes Paternal Grandmother       Current Outpatient Medications on File Prior to Visit  Medication Sig Dispense Refill  .  gabapentin (NEURONTIN) 800 MG tablet Take 1 tablet by mouth 3 (three) times daily.  2  . HYDROcodone-acetaminophen (NORCO/VICODIN) 5-325 MG tablet TK 1 T PO  TID PRN  0  . amLODipine (NORVASC) 5 MG tablet Take 1 tablet (5 mg total) by mouth daily. (Patient not taking: Reported on 12/12/2020) 90 tablet 3  . atorvastatin (LIPITOR) 40 MG tablet Take 1 tablet (40 mg total) by mouth daily. (Patient not taking: Reported on 12/12/2020) 90 tablet 3  . lisinopril-hydrochlorothiazide (ZESTORETIC) 20-25 MG tablet Take 1 tablet by mouth daily. (Patient not taking: Reported on 12/12/2020) 90 tablet 0  . PARoxetine (PAXIL) 20 MG tablet TAKE 1 TABLET(20 MG) BY MOUTH EVERY MORNING (Patient not taking: Reported on 12/12/2020) 90 tablet 1  . promethazine (PHENERGAN) 25 MG tablet  Take one tablet every 8 hours as needed (Patient not taking: Reported on 12/12/2020) 30 tablet 1  . SUMAtriptan (IMITREX) 100 MG tablet Take 1 tablet (100 mg total) by mouth every 2 (two) hours as needed for migraine. May repeat in 2 hours if headache persists or recurs. (Patient not taking: Reported on 12/12/2020) 15 tablet 3   No current facility-administered medications on file prior to visit.    BP (!) 190/100   Temp 98.4 F (36.9 C)   Wt 192 lb (87.1 kg)   BMI 27.55 kg/m       Objective:   Physical Exam Vitals and nursing note reviewed.  Constitutional:      Appearance: Normal appearance.  Eyes:     Extraocular Movements: Extraocular movements intact.     Pupils: Pupils are equal, round, and reactive to light.  Cardiovascular:     Rate and Rhythm: Normal rate and regular rhythm.     Pulses: Normal pulses.     Heart sounds: Normal heart sounds.  Pulmonary:     Effort: Pulmonary effort is normal.     Breath sounds: Normal breath sounds.  Musculoskeletal:        General: Normal range of motion.  Skin:    General: Skin is warm and dry.  Neurological:     General: No focal deficit present.     Mental Status: He is oriented to person, place, and time.  Psychiatric:        Attention and Perception: Attention and perception normal.        Mood and Affect: Mood is anxious.        Behavior: Behavior normal.        Thought Content: Thought content normal.        Judgment: Judgment normal.       Assessment & Plan:  1. Anxiety state  - PARoxetine (PAXIL) 20 MG tablet; Take 1 tablet (20 mg total) by mouth daily.  Dispense: 30 tablet; Refill: 0  2. Essential hypertension -He will go to the pharmacy after he leaves here and start his blood pressure medication this morning.  To her blood pressure at home.  He will follow-up in 30 days for CPE - amLODipine (NORVASC) 5 MG tablet; Take 1 tablet (5 mg total) by mouth daily.  Dispense: 30 tablet; Refill: 0 -  lisinopril-hydrochlorothiazide (ZESTORETIC) 20-25 MG tablet; Take 1 tablet by mouth daily.  Dispense: 30 tablet; Refill: 0  3. Dyslipidemia  - atorvastatin (LIPITOR) 40 MG tablet; Take 1 tablet (40 mg total) by mouth daily.  Dispense: 30 tablet; Refill: 0  4. Migraine without aura and without status migrainosus, not intractable  - SUMAtriptan (IMITREX) 100 MG tablet; Take 1  tablet (100 mg total) by mouth every 2 (two) hours as needed for migraine. May repeat in 2 hours if headache persists or recurs.  Dispense: 15 tablet; Refill: 3   Dorothyann Peng, NP

## 2021-01-11 ENCOUNTER — Other Ambulatory Visit: Payer: Self-pay

## 2021-01-12 ENCOUNTER — Ambulatory Visit
Admission: RE | Admit: 2021-01-12 | Discharge: 2021-01-12 | Disposition: A | Discharge status: Home / Self Care | Payer: 59 | Source: Ambulatory Visit | Attending: Adult Health | Admitting: Adult Health

## 2021-01-12 ENCOUNTER — Ambulatory Visit (INDEPENDENT_AMBULATORY_CARE_PROVIDER_SITE_OTHER): Payer: 59 | Admitting: Adult Health

## 2021-01-12 ENCOUNTER — Encounter: Payer: Self-pay | Admitting: Adult Health

## 2021-01-12 ENCOUNTER — Ambulatory Visit (INDEPENDENT_AMBULATORY_CARE_PROVIDER_SITE_OTHER): Payer: 59

## 2021-01-12 ENCOUNTER — Other Ambulatory Visit: Payer: Self-pay | Admitting: Adult Health

## 2021-01-12 VITALS — BP 108/80 | Temp 98.4°F | Wt 183.0 lb

## 2021-01-12 DIAGNOSIS — Z125 Encounter for screening for malignant neoplasm of prostate: Secondary | ICD-10-CM | POA: Diagnosis not present

## 2021-01-12 DIAGNOSIS — I10 Essential (primary) hypertension: Secondary | ICD-10-CM

## 2021-01-12 DIAGNOSIS — E1165 Type 2 diabetes mellitus with hyperglycemia: Secondary | ICD-10-CM

## 2021-01-12 DIAGNOSIS — Z0001 Encounter for general adult medical examination with abnormal findings: Secondary | ICD-10-CM | POA: Diagnosis not present

## 2021-01-12 DIAGNOSIS — E785 Hyperlipidemia, unspecified: Secondary | ICD-10-CM

## 2021-01-12 DIAGNOSIS — Z1211 Encounter for screening for malignant neoplasm of colon: Secondary | ICD-10-CM

## 2021-01-12 DIAGNOSIS — G43009 Migraine without aura, not intractable, without status migrainosus: Secondary | ICD-10-CM

## 2021-01-12 DIAGNOSIS — R1011 Right upper quadrant pain: Secondary | ICD-10-CM

## 2021-01-12 DIAGNOSIS — F411 Generalized anxiety disorder: Secondary | ICD-10-CM

## 2021-01-12 LAB — CBC WITH DIFFERENTIAL/PLATELET
Basophils Absolute: 0.3 10*3/uL — ABNORMAL HIGH (ref 0.0–0.1)
Basophils Relative: 5.6 % — ABNORMAL HIGH (ref 0.0–3.0)
Eosinophils Absolute: 0.1 10*3/uL (ref 0.0–0.7)
Eosinophils Relative: 2.7 % (ref 0.0–5.0)
HCT: 44.4 % (ref 39.0–52.0)
Hemoglobin: 15.8 g/dL (ref 13.0–17.0)
Lymphocytes Relative: 25.6 % (ref 12.0–46.0)
Lymphs Abs: 1.1 10*3/uL (ref 0.7–4.0)
MCHC: 35.5 g/dL (ref 30.0–36.0)
MCV: 86.3 fl (ref 78.0–100.0)
Monocytes Absolute: 0.3 10*3/uL (ref 0.1–1.0)
Monocytes Relative: 7.6 % (ref 3.0–12.0)
Neutro Abs: 2.6 10*3/uL (ref 1.4–7.7)
Neutrophils Relative %: 58.5 % (ref 43.0–77.0)
Platelets: 308 10*3/uL (ref 150.0–400.0)
RBC: 5.14 Mil/uL (ref 4.22–5.81)
RDW: 12.5 % (ref 11.5–15.5)
WBC: 4.5 10*3/uL (ref 4.0–10.5)

## 2021-01-12 LAB — COMPREHENSIVE METABOLIC PANEL
ALT: 35 U/L (ref 0–53)
AST: 16 U/L (ref 0–37)
Albumin: 4.9 g/dL (ref 3.5–5.2)
Alkaline Phosphatase: 71 U/L (ref 39–117)
BUN: 17 mg/dL (ref 6–23)
CO2: 28 mEq/L (ref 19–32)
Calcium: 10.2 mg/dL (ref 8.4–10.5)
Chloride: 99 mEq/L (ref 96–112)
Creatinine, Ser: 1.02 mg/dL (ref 0.40–1.50)
GFR: 87.25 mL/min (ref >60.00)
Glucose, Bld: 204 mg/dL — ABNORMAL HIGH (ref 70–99)
Potassium: 3.6 mEq/L (ref 3.5–5.1)
Sodium: 137 mEq/L (ref 135–145)
Total Bilirubin: 0.8 mg/dL (ref 0.2–1.2)
Total Protein: 7.3 g/dL (ref 6.0–8.3)

## 2021-01-12 LAB — TSH: TSH: 1.2 u[IU]/mL (ref 0.35–4.50)

## 2021-01-12 LAB — LIPID PANEL
Cholesterol: 226 mg/dL — ABNORMAL HIGH (ref 0–200)
HDL: 37.6 mg/dL — ABNORMAL LOW (ref >39.00)
NonHDL: 188.47
Total CHOL/HDL Ratio: 6
Triglycerides: 391 mg/dL — ABNORMAL HIGH (ref 0.0–149.0)
VLDL: 78.2 mg/dL — ABNORMAL HIGH (ref 0.0–40.0)

## 2021-01-12 LAB — LDL CHOLESTEROL, DIRECT: Direct LDL: 117 mg/dL

## 2021-01-12 LAB — HEMOGLOBIN A1C: Hgb A1c MFr Bld: 8.4 % — ABNORMAL HIGH (ref 4.6–6.5)

## 2021-01-12 LAB — AMYLASE: Amylase: 30 U/L (ref 27–131)

## 2021-01-12 LAB — LIPASE: Lipase: 14 U/L (ref 11.0–59.0)

## 2021-01-12 LAB — PSA: PSA: 1.59 ng/mL (ref 0.10–4.00)

## 2021-01-12 MED ORDER — ATORVASTATIN CALCIUM 40 MG PO TABS: 40.0000 mg | ORAL_TABLET | Freq: Every day | ORAL | 11 refills | Status: DC

## 2021-01-12 MED ORDER — METFORMIN HCL 500 MG PO TABS: 500.0000 mg | ORAL_TABLET | Freq: Two times a day (BID) | ORAL | 0 refills | Status: DC

## 2021-01-12 MED ORDER — LISINOPRIL-HYDROCHLOROTHIAZIDE 20-25 MG PO TABS
1.0000 | ORAL_TABLET | Freq: Every day | ORAL | 11 refills | Status: DC
Start: 2021-01-12 — End: 2022-01-21

## 2021-01-12 MED ORDER — AMLODIPINE BESYLATE 5 MG PO TABS: 5.0000 mg | ORAL_TABLET | Freq: Every day | ORAL | 11 refills | Status: DC

## 2021-01-12 MED ORDER — AMITRIPTYLINE HCL 25 MG PO TABS: 25.0000 mg | ORAL_TABLET | Freq: Every day | ORAL | 0 refills | Status: DC

## 2021-01-12 MED ORDER — PAROXETINE HCL 40 MG PO TABS: 40.0000 mg | ORAL_TABLET | Freq: Every day | ORAL | 3 refills | Status: DC

## 2021-01-12 NOTE — Progress Notes (Signed)
Subjective:    Patient ID: Dustin Henry, male    DOB: 1971/04/07, 50 y.o.   MRN: [id number]  HPI Patient presents for yearly preventative medicine examination. He is a pleasant 50 year old male who  has a past medical history of ALLERGIC RHINITIS (08/18/2007), BACK PAIN, Depression, Difficult intubation, History of kidney stones, HYPERLIPIDEMIA (02/12/2010), HYPERTENSION, IBS (irritable bowel syndrome), MIGRAINE HEADACHE, and Nocturia.  Essential hypertension-takes lisinopril/hydrochlorothiazide 20-47m and Norvasc 5 mg.  He denies chest pain, shortness of breath, dizziness, lightheadedness, headaches, or syncopal episodes.  He was last seen approximately 1 month ago as he was out of most of his medications.  He had a lot of stress from work as his [employer] and he did not have another job lined up. He has restarted his medications and BP is at goal.   BP Readings from Last 3 Encounters:  01/12/21 108/80  12/12/20 (!) 190/100  12/24/19 (!) 139/103   Anxiety-currently taking Paxil and has been back on this medication for a month.  Still feels very anxious and is having a hard time sleeping. He is going to start seeing his therapist again   Migraine headaches-takes Imitrex as needed but does not like to take it due to the sedation that it causes.  Reports that he has been getting some type of headache daily, not necessarily migraine headache but will get a headache.  Been taking over-the-counter medications with minimal relief   Osteoarthritis-worse in back and shoulders.  Is seen by orthopedics.  Currently prescribed gabapentin and Norco  Hyperlipidemia - prescribed lipitor 40 mg. He denies myalgia or fatigue  All immunizations and health maintenance protocols were reviewed with the patient and needed orders were placed.  Acute Issues  - Right upper quadrant pain -intermittent right upper quadrant pain over the last 2 to 3 days.  Pain will radiate to epigastric area pain  is felt as a "cramping pain".  Not worse after eating.  Has not experienced nausea, vomiting, diarrhea.  He has not had this type of pain in the past.  Denies trauma or aggravating injury.  Denies recent alcohol use and is not a heavy drinker.  Appropriate screening laboratory values were ordered for the patient including screening of hyperlipidemia, renal function and hepatic function. If indicated by BPH, a PSA was ordered.  Medication reconciliation,  past medical history, social history, problem list and allergies were reviewed in detail with the patient  Goals were established with regard to weight loss, exercise, and  diet in compliance with medications  He is due for routine colon cancer screening  Review of Systems  Constitutional: Negative.   HENT: Negative.   Eyes: Negative.   Respiratory: Negative.   Cardiovascular: Negative.   Gastrointestinal: Positive for abdominal pain.  Endocrine: Negative.   Genitourinary: Negative.   Musculoskeletal: Positive for arthralgias and back pain.  Skin: Negative.   Allergic/Immunologic: Negative.   Neurological: Positive for headaches.  Hematological: Negative.   Psychiatric/Behavioral: Positive for sleep disturbance. The patient is nervous/anxious.   All other systems reviewed and are negative.  Past Medical History:  Diagnosis Date  . ALLERGIC RHINITIS 08/18/2007  . BACK PAIN    herniated disc  . Depression    takes Paxil daily  . Difficult intubation    09/05/10: difficult airway documented (ant cords vis with head up/cricoid press), MAC 4 used  . History of kidney stones   . HYPERLIPIDEMIA 02/12/2010  . HYPERTENSION    takes Amlodipine and  Lisinopril-HCTZ daily  . IBS (irritable bowel syndrome)   . MIGRAINE HEADACHE    takes Imitrex daily as needed  . Nocturia     Social History   Socioeconomic History  . Marital status: Married    Spouse name: Not on file  . Number of children: 1  . Years of education: Not on file  .  Highest education level: Not on file  Occupational History  . Occupation: pressman helper    Comment: Engineer, structural  Tobacco Use  . Smoking status: Former Smoker    Packs/day: 3.00    Years: 10.00    Pack years: 30.00    Types: Cigarettes  . Smokeless tobacco: Never Used  . Tobacco comment: quit smoking about 29yr ago  Substance and Sexual Activity  . Alcohol use: Yes    Comment: occasionally  . Drug use: No  . Sexual activity: Not on file  Other Topics Concern  . Not on file  Social History Narrative  . Not on file   Social Determinants of Health   Financial Resource Strain: Not on file  Food Insecurity: Not on file  Transportation Needs: Not on file  Physical Activity: Not on file  Stress: Not on file  Social Connections: Not on file  Intimate Partner Violence: Not on file    Past Surgical History:  Procedure Laterality Date  . CYSTOSCOPY    . FINGER SURGERY    . POSTERIOR CERVICAL FUSION/FORAMINOTOMY N/A 11/22/2015   Procedure: TLIF L5-S1;  Surgeon: DMelina Schools MD;  [facility] [facility]  Service: Orthopedics;  Laterality: N/A;  . SHOULDER SURGERY Right 03/2018   Dr. NVeverly Fells   Family History  Problem Relation Age of Onset  . High blood pressure Father   . Diabetes Father   . Arthritis Father   . Heart disease Other        No family history  . Diabetes Paternal Grandmother       Current Outpatient Medications on File Prior to Visit  Medication Sig Dispense Refill  . amLODipine (NORVASC) 5 MG tablet Take 1 tablet (5 mg total) by mouth daily. 30 tablet 0  . atorvastatin (LIPITOR) 40 MG tablet Take 1 tablet (40 mg total) by mouth daily. 30 tablet 0  . gabapentin (NEURONTIN) 800 MG tablet Take 1 tablet by mouth 3 (three) times daily.  2  . HYDROcodone-acetaminophen (NORCO/VICODIN) 5-325 MG tablet TK 1 T PO  TID PRN  0  . lisinopril-hydrochlorothiazide (ZESTORETIC) 20-25 MG tablet Take 1 tablet by mouth daily. 30 tablet 0  . PARoxetine (PAXIL) 20 MG tablet  Take 1 tablet (20 mg total) by mouth daily. 30 tablet 0  . promethazine (PHENERGAN) 25 MG tablet Take one tablet every 8 hours as needed (Patient not taking: Reported on 12/12/2020) 30 tablet 1  . SUMAtriptan (IMITREX) 100 MG tablet Take 1 tablet (100 mg total) by mouth every 2 (two) hours as needed for migraine. May repeat in 2 hours if headache persists or recurs. 15 tablet 3   No current facility-administered medications on file prior to visit.    BP 108/80   Temp 98.4 F (36.9 C)   Wt 183 lb (83 kg)   BMI 26.26 kg/m       Objective:   Physical Exam Vitals and nursing note reviewed.  Constitutional:      General: He is in acute distress (appears in discomfort ).     Appearance: Normal appearance. He is well-developed and normal weight.  HENT:  Head: Normocephalic and atraumatic.     Right Ear: Tympanic membrane, ear canal and external ear normal. There is no impacted cerumen.     Left Ear: Tympanic membrane, ear canal and external ear normal. There is no impacted cerumen.     Nose: Nose normal. No congestion or rhinorrhea.     Mouth/Throat:     Mouth: Mucous membranes are moist.     Pharynx: Oropharynx is clear. No oropharyngeal exudate or posterior oropharyngeal erythema.  Eyes:     General:        Right eye: No discharge.        Left eye: No discharge.     Extraocular Movements: Extraocular movements intact.     Conjunctiva/sclera: Conjunctivae normal.     Pupils: Pupils are equal, round, and reactive to light.  Neck:     Vascular: No carotid bruit.     Trachea: No tracheal deviation.  Cardiovascular:     Rate and Rhythm: Normal rate and regular rhythm.     Pulses: Normal pulses.     Heart sounds: Normal heart sounds. No murmur heard. No friction rub. No gallop.   Pulmonary:     Effort: Pulmonary effort is normal. No respiratory distress.     Breath sounds: Normal breath sounds. No stridor. No wheezing, rhonchi or rales.  Chest:     Chest wall: No tenderness.   Abdominal:     General: Bowel sounds are normal. There is no distension.     Palpations: Abdomen is soft. There is no mass.     Tenderness: There is abdominal tenderness in the right upper quadrant and epigastric area. There is no right CVA tenderness, left CVA tenderness, guarding or rebound. Positive signs include Murphy's sign.     Hernia: No hernia is present.  Musculoskeletal:        General: No swelling, tenderness, deformity or signs of injury. Normal range of motion.     Right lower leg: No edema.     Left lower leg: No edema.  Lymphadenopathy:     Cervical: No cervical adenopathy.  Skin:    General: Skin is warm and dry.     Capillary Refill: Capillary refill takes less than 2 seconds.     Coloration: Skin is not jaundiced or pale.     Findings: No bruising, erythema, lesion or rash.  Neurological:     General: No focal deficit present.     Mental Status: He is alert and oriented to person, place, and time.     Cranial Nerves: No cranial nerve deficit.     Sensory: No sensory deficit.     Motor: No weakness.     Coordination: Coordination normal.     Gait: Gait normal.     Deep Tendon Reflexes: Reflexes normal.  Psychiatric:        Mood and Affect: Mood normal.        Behavior: Behavior normal.        Thought Content: Thought content normal.        Judgment: Judgment normal.       Assessment & Plan:  1. Encounter for general adult medical examination with abnormal findings  - CBC with Differential/Platelet; Future - Comprehensive metabolic panel; Future - Hemoglobin A1c; Future - Lipid panel; Future - TSH; Future - TSH - Lipid panel - Hemoglobin A1c - Comprehensive metabolic panel - CBC with Differential/Platelet  2. Prostate cancer screening  - PSA; Future - PSA  3. Essential hypertension - At  goal. No change in medications  - CBC with Differential/Platelet; Future - Comprehensive metabolic panel; Future - Hemoglobin A1c; Future - Lipid panel;  Future - TSH; Future - lisinopril-hydrochlorothiazide (ZESTORETIC) 20-25 MG tablet; Take 1 tablet by mouth daily.  Dispense: 30 tablet; Refill: 11 - amLODipine (NORVASC) 5 MG tablet; Take 1 tablet (5 mg total) by mouth daily.  Dispense: 30 tablet; Refill: 11 - TSH - Lipid panel - Hemoglobin A1c - Comprehensive metabolic panel - CBC with Differential/Platelet  4. Dyslipidemia - Consider increase in lipitor  - CBC with Differential/Platelet; Future - Comprehensive metabolic panel; Future - Hemoglobin A1c; Future - Lipid panel; Future - TSH; Future - atorvastatin (LIPITOR) 40 MG tablet; Take 1 tablet (40 mg total) by mouth daily.  Dispense: 30 tablet; Refill: 11 - TSH - Lipid panel - Hemoglobin A1c - Comprehensive metabolic panel - CBC with Differential/Platelet  5. Anxiety state - Will increase PAxil to 40 mg  - Follow up in one month or sooner if needed - PARoxetine (PAXIL) 40 MG tablet; Take 1 tablet (40 mg total) by mouth daily.  Dispense: 30 tablet; Refill: 3 - amitriptyline (ELAVIL) 25 MG tablet; Take 1 tablet (25 mg total) by mouth at bedtime.  Dispense: 30 tablet; Refill: 0  6. Migraine without aura and without status migrainosus, not intractable - Will add Elavil to help with headaches.  - He will follow up in one month  - amitriptyline (ELAVIL) 25 MG tablet; Take 1 tablet (25 mg total) by mouth at bedtime.  Dispense: 30 tablet; Refill: 0  7. RUQ pain -Suspect gallbladder disease but cannot rule out pancreatitis versus stomach ulcer/GERD. - CBC with Differential/Platelet; Future - Comprehensive metabolic panel; Future - Hemoglobin A1c; Future - Lipid panel; Future - TSH; Future - PSA; Future - US Abdomen Limited RUQ (LIVER/GB); Future - DG Abd 1 View; Future - Lipase; Future - Amylase; Future - Amylase - Lipase - PSA - TSH - Lipid panel - Hemoglobin A1c - Comprehensive metabolic panel - CBC with Differential/Platelet  8. Colon cancer screening  -  Ambulatory referral to Gastroenterology  Dorothyann Peng, NP

## 2021-01-12 NOTE — Patient Instructions (Signed)
I have sent in all your meds and will follow up with you once we get your blood work back   I am going to order an ultrasound of your right upper quadrant - they will call you to schedule that   I will see you back in one month   Someone will call you to schedule your colonoscopy

## 2021-01-15 ENCOUNTER — Other Ambulatory Visit: Payer: Self-pay | Admitting: Adult Health

## 2021-01-15 ENCOUNTER — Telehealth: Payer: Self-pay | Admitting: Adult Health

## 2021-01-15 DIAGNOSIS — R110 Nausea: Secondary | ICD-10-CM

## 2021-01-15 NOTE — Telephone Encounter (Signed)
Patient is requesting a refill on his Promethazine.  Pharmacy: [facility] on [address].  Please advise.

## 2021-01-16 MED ORDER — PROMETHAZINE HCL 25 MG PO TABS
ORAL_TABLET | ORAL | 1 refills | Status: DC
Start: 2021-01-16 — End: 2021-07-23

## 2021-02-09 ENCOUNTER — Ambulatory Visit: Payer: 59 | Admitting: Adult Health

## 2021-02-14 ENCOUNTER — Other Ambulatory Visit: Payer: Self-pay | Admitting: Adult Health

## 2021-02-14 ENCOUNTER — Other Ambulatory Visit: Payer: Self-pay

## 2021-02-14 ENCOUNTER — Ambulatory Visit: Payer: 59 | Admitting: Adult Health

## 2021-02-14 ENCOUNTER — Encounter: Payer: Self-pay | Admitting: Adult Health

## 2021-02-14 VITALS — BP 144/90 | HR 93 | Temp 97.8°F | Ht 70.0 in | Wt 187.4 lb

## 2021-02-14 DIAGNOSIS — G43009 Migraine without aura, not intractable, without status migrainosus: Secondary | ICD-10-CM

## 2021-02-14 DIAGNOSIS — F411 Generalized anxiety disorder: Secondary | ICD-10-CM | POA: Diagnosis not present

## 2021-02-14 DIAGNOSIS — E1165 Type 2 diabetes mellitus with hyperglycemia: Secondary | ICD-10-CM

## 2021-02-14 DIAGNOSIS — I10 Essential (primary) hypertension: Secondary | ICD-10-CM

## 2021-02-14 MED ORDER — HYDROXYZINE HCL 10 MG PO TABS: 10.0000 mg | ORAL_TABLET | Freq: Three times a day (TID) | ORAL | 0 refills | Status: DC | PRN

## 2021-02-14 MED ORDER — ONETOUCH ULTRA 2 W/DEVICE KIT: 1.0000 | PACK | Freq: Three times a day (TID) | 0 refills | Status: AC

## 2021-02-14 MED ORDER — ONETOUCH ULTRASOFT LANCETS MISC: 6 refills | Status: AC

## 2021-02-14 MED ORDER — AMITRIPTYLINE HCL 25 MG PO TABS: 25.0000 mg | ORAL_TABLET | Freq: Every day | ORAL | 0 refills | Status: DC

## 2021-02-14 MED ORDER — GLUCOSE BLOOD VI STRP: ORAL_STRIP | 6 refills | Status: AC

## 2021-02-14 NOTE — Progress Notes (Signed)
Subjective:    Patient ID: Dustin Henry, male    DOB: 1971/07/10, 50 y.o.   MRN: [id number]  HPI  50 year old male who  has a past medical history of ALLERGIC RHINITIS (08/18/2007), BACK PAIN, Depression, Difficult intubation, History of kidney stones, HYPERLIPIDEMIA (02/12/2010), HYPERTENSION, IBS (irritable bowel syndrome), MIGRAINE HEADACHE, and Nocturia.  He presents to the office today for one month follow up regarding multiple issues   DM -newly diagnosed at his physical in February 2022.  At this time his A1c was 8.4.  He was placed on Metformin 100 mg twice daily.  He reports that he has started working on lifestyle modifications, cutting back on sodas and sweets.  He denies episodes of hypoglycemia.  Anxiety -currently prescribed Paxil 40 mg.  Reports that his symptoms are improved but he continues to feel quite anxious throughout the day.  When we last spoke he was going to start seeing his therapist again but has not done so yet-he plans to call and schedule his appointment.  Migraine headaches -was started on Elavil 5 mg approximately 1 month ago.  He does report that his headaches have improved but he continues to get episodic migraine headaches, just less frequently.  Review of Systems See HPI   Past Medical History:  Diagnosis Date  . ALLERGIC RHINITIS 08/18/2007  . BACK PAIN    herniated disc  . Depression    takes Paxil daily  . Difficult intubation    09/05/10: difficult airway documented (ant cords vis with head up/cricoid press), MAC 4 used  . History of kidney stones   . HYPERLIPIDEMIA 02/12/2010  . HYPERTENSION    takes Amlodipine and Lisinopril-HCTZ daily  . IBS (irritable bowel syndrome)   . MIGRAINE HEADACHE    takes Imitrex daily as needed  . Nocturia     Social History   Socioeconomic History  . Marital status: Married    Spouse name: Not on file  . Number of children: 1  . Years of education: Not on file  . Highest education level: Not on file   Occupational History  . Occupation: pressman helper    Comment: Engineer, structural  Tobacco Use  . Smoking status: Former Smoker    Packs/day: 3.00    Years: 10.00    Pack years: 30.00    Types: Cigarettes  . Smokeless tobacco: Never Used  . Tobacco comment: quit smoking about 59yr ago  Substance and Sexual Activity  . Alcohol use: Yes    Comment: occasionally  . Drug use: No  . Sexual activity: Not on file  Other Topics Concern  . Not on file  Social History Narrative  . Not on file   Social Determinants of Health   Financial Resource Strain: Not on file  Food Insecurity: Not on file  Transportation Needs: Not on file  Physical Activity: Not on file  Stress: Not on file  Social Connections: Not on file  Intimate Partner Violence: Not on file    Past Surgical History:  Procedure Laterality Date  . CYSTOSCOPY    . FINGER SURGERY    . POSTERIOR CERVICAL FUSION/FORAMINOTOMY N/A 11/22/2015   Procedure: TLIF L5-S1;  Surgeon: DMelina Schools MD;  Location[place] [facility]  Service: Orthopedics;  Laterality: N/A;  . SHOULDER SURGERY Right 03/2018   Dr. NVeverly Fells   Family History  Problem Relation Age of Onset  . High blood pressure Father   . Diabetes Father   . Arthritis Father   .  Heart disease Other        No family history  . Diabetes Paternal Grandmother       Current Outpatient Medications on File Prior to Visit  Medication Sig Dispense Refill  . amLODipine (NORVASC) 5 MG tablet Take 1 tablet (5 mg total) by mouth daily. 30 tablet 11  . atorvastatin (LIPITOR) 40 MG tablet Take 1 tablet (40 mg total) by mouth daily. 30 tablet 11  . gabapentin (NEURONTIN) 800 MG tablet Take 1 tablet by mouth 3 (three) times daily.  2  . HYDROcodone-acetaminophen (NORCO/VICODIN) 5-325 MG tablet TK 1 T PO  TID PRN  0  . lisinopril-hydrochlorothiazide (ZESTORETIC) 20-25 MG tablet Take 1 tablet by mouth daily. 30 tablet 11  . metFORMIN (GLUCOPHAGE) 500 MG tablet Take 1 tablet (500 mg total) by  mouth 2 (two) times daily with a meal. 180 tablet 0  . promethazine (PHENERGAN) 25 MG tablet Take one tablet every 8 hours as needed 30 tablet 1  . SUMAtriptan (IMITREX) 100 MG tablet Take 1 tablet (100 mg total) by mouth every 2 (two) hours as needed for migraine. May repeat in 2 hours if headache persists or recurs. 15 tablet 3  . PARoxetine (PAXIL) 40 MG tablet Take 1 tablet (40 mg total) by mouth daily. 30 tablet 3   No current facility-administered medications on file prior to visit.    BP (!) 144/90   Pulse 93   Temp 97.8 F (36.6 C) (Oral)   Ht _0  (1.778 m)   Wt 187 lb 6 oz (85 kg)   SpO2 99%   BMI 26.89 kg/m       Objective:   Physical Exam Vitals and nursing note reviewed.  Constitutional:      Appearance: Normal appearance.  Cardiovascular:     Rate and Rhythm: Normal rate and regular rhythm.     Pulses: Normal pulses.     Heart sounds: Normal heart sounds.  Pulmonary:     Effort: Pulmonary effort is normal.     Breath sounds: Normal breath sounds.  Skin:    General: Skin is warm and dry.     Capillary Refill: Capillary refill takes less than 2 seconds.  Neurological:     General: No focal deficit present.     Mental Status: He is alert and oriented to person, place, and time.  Psychiatric:        Mood and Affect: Mood is anxious.        Behavior: Behavior normal.        Thought Content: Thought content normal.        Judgment: Judgment normal.       Assessment & Plan:   1. Anxiety state -Does appear quite anxious in the office today.  Will prescribe Atarax 10 mg 3 times daily.  We will see how he does on this medication.  Courage to follow-up with the therapist.  He does not wish to see a psychiatrist at this point - hydrOXYzine (ATARAX/VISTARIL) 10 MG tablet; Take 1 tablet (10 mg total) by mouth 3 (three) times daily as needed.  Dispense: 90 tablet; Refill: 0  2. Migraine without aura and without status migrainosus, not intractable -Continue  with  Elavil 25 mg. - amitriptyline (ELAVIL) 25 MG tablet; Take 1 tablet (25 mg total) by mouth at bedtime.  Dispense: 30 tablet; Refill: 0  3. Uncontrolled type 2 diabetes mellitus with hyperglycemia ([place]) -We will have him start monitoring his blood sugars at home.  Repeat the A1c in 2 months.  Encouraged to continue with lifestyle modifications.  Information given on heart healthy diabetic diets - Lancets (ONETOUCH ULTRASOFT) lancets; Use as instructed  Dispense: 300 each; Refill: 6 - Blood Glucose Monitoring Suppl (ONE TOUCH ULTRA 2) w/Device KIT; 1 kit by Does not apply route in the morning, at noon, and at bedtime.  Dispense: 1 kit; Refill: 0 - glucose blood test strip; Use as instructed  Dispense: 300 each; Refill: 6  Dorothyann Peng, NP

## 2021-04-12 ENCOUNTER — Other Ambulatory Visit: Payer: Self-pay

## 2021-04-13 ENCOUNTER — Encounter: Payer: Self-pay | Admitting: Adult Health

## 2021-04-13 ENCOUNTER — Ambulatory Visit: Payer: 59 | Admitting: Adult Health

## 2021-04-13 ENCOUNTER — Telehealth: Payer: Self-pay | Admitting: Adult Health

## 2021-04-13 VITALS — BP 144/86 | Temp 98.5°F | Wt 181.2 lb

## 2021-04-13 DIAGNOSIS — E1165 Type 2 diabetes mellitus with hyperglycemia: Secondary | ICD-10-CM | POA: Diagnosis not present

## 2021-04-13 DIAGNOSIS — F411 Generalized anxiety disorder: Secondary | ICD-10-CM

## 2021-04-13 DIAGNOSIS — I10 Essential (primary) hypertension: Secondary | ICD-10-CM

## 2021-04-13 LAB — POCT GLYCOSYLATED HEMOGLOBIN (HGB A1C): Hemoglobin A1C: 7 % — AB (ref 4.0–5.6)

## 2021-04-13 MED ORDER — METFORMIN HCL 500 MG PO TABS: 500.0000 mg | ORAL_TABLET | Freq: Two times a day (BID) | ORAL | 2 refills | Status: DC

## 2021-04-13 MED ORDER — PAROXETINE HCL 40 MG PO TABS: 40.0000 mg | ORAL_TABLET | Freq: Every day | ORAL | 6 refills | Status: DC

## 2021-04-13 NOTE — Progress Notes (Signed)
Subjective:    Patient ID: Dustin Henry, male    DOB: 1971/11/16, 50 y.o.   MRN: [id number]  HPI 50 year old male who  has a past medical history of ALLERGIC RHINITIS (08/18/2007), BACK PAIN, Depression, Difficult intubation, History of kidney stones, HYPERLIPIDEMIA (02/12/2010), HYPERTENSION, IBS (irritable bowel syndrome), MIGRAINE HEADACHE, and Nocturia.  He presents to the office today for two month follow up regarding DM/HTN/Anxiety  He reports that over the last two weeks he has " fallen off the wagon: and has not taken his medications nor been checking his blood sugars  DM -newly diagnosed in February 2022.  At this time his A1c was 8.4.  He was placed on metformin 1000 mg twice daily.  He has started working on lifestyle modifications, cutting back on sodas and sweets.  He denies episodes of hypoglycemia. His last A1c was 8.4. He is working outside a Pharmacist, community.   Wt Readings from Last 3 Encounters:  04/13/21 181 lb 3.2 oz (82.2 kg)  02/14/21 187 lb 6 oz (85 kg)  01/12/21 183 lb (83 kg)   Anxiety -prescribed Paxil 40 mg. He has not seen his counselor but plans to call and schedule an appointment. Has not taken his medication in two weeks    HTN -prescribed Norvasc 5 mg and lisinopril/hydrochlorothiazide 20-25 mg.  He denies dizziness, lightheadedness, chest pain, or shortness of breath. Has not taken his medication in two weeks  BP Readings from Last 3 Encounters:  04/13/21 (!) 144/86  02/14/21 (!) 144/90  01/12/21 108/80    Review of Systems See HPI   Past Medical History:  Diagnosis Date  . ALLERGIC RHINITIS 08/18/2007  . BACK PAIN    herniated disc  . Depression    takes Paxil daily  . Difficult intubation    09/05/10: difficult airway documented (ant cords vis with head up/cricoid press), MAC 4 used  . History of kidney stones   . HYPERLIPIDEMIA 02/12/2010  . HYPERTENSION    takes Amlodipine and Lisinopril-HCTZ daily  . IBS (irritable bowel syndrome)    . MIGRAINE HEADACHE    takes Imitrex daily as needed  . Nocturia     Social History   Socioeconomic History  . Marital status: Married    Spouse name: Not on file  . Number of children: 1  . Years of education: Not on file  . Highest education level: Not on file  Occupational History  . Occupation: pressman helper    Comment: Engineer, structural  Tobacco Use  . Smoking status: Former Smoker    Packs/day: 3.00    Years: 10.00    Pack years: 30.00    Types: Cigarettes  . Smokeless tobacco: Never Used  . Tobacco comment: quit smoking about 39yr ago  Substance and Sexual Activity  . Alcohol use: Yes    Comment: occasionally  . Drug use: No  . Sexual activity: Not on file  Other Topics Concern  . Not on file  Social History Narrative  . Not on file   Social Determinants of Health   Financial Resource Strain: Not on file  Food Insecurity: Not on file  Transportation Needs: Not on file  Physical Activity: Not on file  Stress: Not on file  Social Connections: Not on file  Intimate Partner Violence: Not on file    Past Surgical History:  Procedure Laterality Date  . CYSTOSCOPY    . FINGER SURGERY    . POSTERIOR CERVICAL FUSION/FORAMINOTOMY N/A 11/22/2015  Procedure: TLIF L5-S1;  Surgeon: Melina Schools, MD;  Location: [facility];  Service: Orthopedics;  Laterality: N/A;  . SHOULDER SURGERY Right 03/2018   Dr. Veverly Fells    Family History  Problem Relation Age of Onset  . High blood pressure Father   . Diabetes Father   . Arthritis Father   . Heart disease Other        No family history  . Diabetes Paternal Grandmother       Current Outpatient Medications on File Prior to Visit  Medication Sig Dispense Refill  . amitriptyline (ELAVIL) 25 MG tablet TAKE 1 TABLET(25 MG) BY MOUTH AT BEDTIME 30 tablet 3  . amLODipine (NORVASC) 5 MG tablet TAKE 1 TABLET(5 MG) BY MOUTH DAILY 30 tablet 11  . atorvastatin (LIPITOR) 40 MG tablet Take 1 tablet (40 mg total) by mouth daily. 30  tablet 11  . Blood Glucose Monitoring Suppl (ONE TOUCH ULTRA 2) w/Device KIT 1 kit by Does not apply route in the morning, at noon, and at bedtime. 1 kit 0  . gabapentin (NEURONTIN) 800 MG tablet Take 1 tablet by mouth 3 (three) times daily.  2  . glucose blood test strip Use as instructed 300 each 6  . HYDROcodone-acetaminophen (NORCO/VICODIN) 5-325 MG tablet TK 1 T PO  TID PRN  0  . hydrOXYzine (ATARAX/VISTARIL) 10 MG tablet Take 1 tablet (10 mg total) by mouth 3 (three) times daily as needed. 90 tablet 0  . Lancets (ONETOUCH ULTRASOFT) lancets Use as instructed 300 each 6  . lisinopril-hydrochlorothiazide (ZESTORETIC) 20-25 MG tablet Take 1 tablet by mouth daily. 30 tablet 11  . promethazine (PHENERGAN) 25 MG tablet Take one tablet every 8 hours as needed 30 tablet 1  . SUMAtriptan (IMITREX) 100 MG tablet Take 1 tablet (100 mg total) by mouth every 2 (two) hours as needed for migraine. May repeat in 2 hours if headache persists or recurs. 15 tablet 3   No current facility-administered medications on file prior to visit.    BP (!) 144/86   Temp 98.5 F (36.9 C) (Oral)   Wt 181 lb 3.2 oz (82.2 kg)   BMI 26.00 kg/m       Objective:   Physical Exam Vitals and nursing note reviewed.  Cardiovascular:     Rate and Rhythm: Normal rate and regular rhythm.     Pulses: Normal pulses.     Heart sounds: Normal heart sounds.  Pulmonary:     Effort: Pulmonary effort is normal.     Breath sounds: Normal breath sounds.  Musculoskeletal:        General: Normal range of motion.  Skin:    General: Skin is warm.  Neurological:     General: No focal deficit present.     Mental Status: He is alert and oriented to person, place, and time.  Psychiatric:        Mood and Affect: Mood normal.        Behavior: Behavior normal.        Thought Content: Thought content normal.        Judgment: Judgment normal.       Assessment & Plan:  1. Uncontrolled type 2 diabetes mellitus with hyperglycemia  (HCC)  - POCT glycosylated hemoglobin (Hb A1C)- 7.0  - has improved - Start taking medication as directed - Monitor BS and bring log to next visit in three months  - metFORMIN (GLUCOPHAGE) 500 MG tablet; Take 1 tablet (500 mg total) by mouth 2 (two)  times daily with a meal.  Dispense: 60 tablet; Refill: 2  2. Anxiety state  - PARoxetine (PAXIL) 40 MG tablet; Take 1 tablet (40 mg total) by mouth daily.  Dispense: 30 tablet; Refill: 6  3. Essential hypertension - Not at goal.  - No changes in medications at this time - Encouraged to take medications as directed  Dorothyann Peng, NP

## 2021-04-13 NOTE — Telephone Encounter (Signed)
Pt wanted to let Tommi Rumps know that he just called his therapist.  Tommi Rumps had asked about him doing that.

## 2021-05-11 ENCOUNTER — Telehealth: Payer: Self-pay | Admitting: *Deleted

## 2021-05-11 NOTE — Telephone Encounter (Signed)
Dr.Beavers,  Patient is for a direct screening colonoscopy. Per pt's chart history "difficult intubation". Ok for a direct hospital colonoscopy or OV first? Please advise. Thank you, Hillari Zumwalt pv

## 2021-05-21 NOTE — Telephone Encounter (Signed)
Vaughan Basta,   Can you schedule pt for a [facility] case per Dr Tarri Glenn- direct screen for a difficult intubation    Thanks   Lelan Pons

## 2021-05-22 ENCOUNTER — Other Ambulatory Visit: Payer: Self-pay

## 2021-05-22 DIAGNOSIS — Z1211 Encounter for screening for malignant neoplasm of colon: Secondary | ICD-10-CM

## 2021-05-22 NOTE — Telephone Encounter (Signed)
Pt scheduled for Colon at [facility] 06/25/21 with Dr. Tarri Glenn at 10:30am.

## 2021-05-28 ENCOUNTER — Telehealth: Payer: Self-pay | Admitting: Gastroenterology

## 2021-05-28 NOTE — Telephone Encounter (Signed)
Pt called back and wanted to reschedule his colon, he did not know it was at the hospital due to difficult intubation. Pt states he will try to work this out and call us back in a few days to see if he can keep this date.

## 2021-05-28 NOTE — Telephone Encounter (Signed)
Inbound call from pt. He needs to reschedule his procedure on 06/25/21 at [facility]. Please advise. Thanks.

## 2021-06-12 ENCOUNTER — Encounter: Payer: 59 | Admitting: Gastroenterology

## 2021-06-19 ENCOUNTER — Encounter (HOSPITAL_COMMUNITY): Payer: Self-pay | Admitting: Gastroenterology

## 2021-06-20 ENCOUNTER — Other Ambulatory Visit: Payer: Self-pay

## 2021-06-20 ENCOUNTER — Ambulatory Visit (AMBULATORY_SURGERY_CENTER): Payer: Self-pay | Admitting: *Deleted

## 2021-06-20 VITALS — Ht 70.0 in | Wt 182.0 lb

## 2021-06-20 DIAGNOSIS — T884XXA Failed or difficult intubation, initial encounter: Secondary | ICD-10-CM | POA: Insufficient documentation

## 2021-06-20 DIAGNOSIS — Z1211 Encounter for screening for malignant neoplasm of colon: Secondary | ICD-10-CM

## 2021-06-20 MED ORDER — PEG-KCL-NACL-NASULF-NA ASC-C 100 G PO SOLR: 1.0000 | Freq: Once | ORAL | 0 refills | Status: AC

## 2021-06-20 NOTE — Progress Notes (Signed)
No egg or soy allergy known to patient  No issues with past sedation with any surgeries or procedures Patient denies ever being told they had issues or difficulty with intubation  No FH of Malignant Hyperthermia No diet pills per patient No home 02 use per patient  No blood thinners per patient  Pt denies issues with constipation  No A fib or A flutter  EMMI video to pt or via [place] [facility] guidelines implemented in [place] today with Pt and RN  Pt is fully vaccinated  for Covid   MOVI  Coupon given to pt in PV today , Code to Pharmacy and  NO PA's for preps discussed with pt In PV today  Discussed with pt there will be an out-of-pocket cost for prep and that varies from $0 to 70 dollars   Due to the COVID-19 pandemic we are asking patients to follow certain guidelines.  Pt aware of COVID protocols and [facility] guidelines   Pt states he needs to know copay for [facility] case- instructed pt to call [facility], ask for billing-  he states wife is out of work and if too expensive, may have to RS to later date - pt will CB and inform us if needs to [facility]

## 2021-06-22 ENCOUNTER — Other Ambulatory Visit: Payer: Self-pay | Admitting: Adult Health

## 2021-06-22 DIAGNOSIS — F411 Generalized anxiety disorder: Secondary | ICD-10-CM

## 2021-06-25 ENCOUNTER — Encounter (HOSPITAL_COMMUNITY): Payer: Self-pay | Admitting: Gastroenterology

## 2021-06-25 ENCOUNTER — Encounter (HOSPITAL_COMMUNITY)
Admission: RE | Disposition: A | Discharge status: Home / Self Care | Payer: Self-pay | Source: Home / Self Care | Attending: Gastroenterology

## 2021-06-25 ENCOUNTER — Ambulatory Visit (HOSPITAL_COMMUNITY): Payer: 59 | Admitting: Anesthesiology

## 2021-06-25 ENCOUNTER — Other Ambulatory Visit: Payer: Self-pay

## 2021-06-25 ENCOUNTER — Ambulatory Visit (HOSPITAL_COMMUNITY)
Admission: RE | Admit: 2021-06-25 | Discharge: 2021-06-25 | Disposition: A | Discharge status: Home / Self Care | Payer: 59 | Attending: Gastroenterology | Admitting: Gastroenterology

## 2021-06-25 DIAGNOSIS — D124 Benign neoplasm of descending colon: Secondary | ICD-10-CM | POA: Diagnosis not present

## 2021-06-25 DIAGNOSIS — Z1211 Encounter for screening for malignant neoplasm of colon: Secondary | ICD-10-CM | POA: Insufficient documentation

## 2021-06-25 DIAGNOSIS — Z79899 Other long term (current) drug therapy: Secondary | ICD-10-CM | POA: Diagnosis not present

## 2021-06-25 DIAGNOSIS — K621 Rectal polyp: Secondary | ICD-10-CM | POA: Diagnosis not present

## 2021-06-25 DIAGNOSIS — Z87891 Personal history of nicotine dependence: Secondary | ICD-10-CM | POA: Insufficient documentation

## 2021-06-25 DIAGNOSIS — K635 Polyp of colon: Secondary | ICD-10-CM | POA: Insufficient documentation

## 2021-06-25 DIAGNOSIS — Z880 Allergy status to penicillin: Secondary | ICD-10-CM | POA: Diagnosis not present

## 2021-06-25 HISTORY — PX: POLYPECTOMY: SHX5525

## 2021-06-25 HISTORY — PX: COLONOSCOPY WITH PROPOFOL: SHX5780

## 2021-06-25 HISTORY — DX: Type 2 diabetes mellitus without complications: E11.9

## 2021-06-25 LAB — GLUCOSE, CAPILLARY: Glucose-Capillary: 124 mg/dL — ABNORMAL HIGH (ref 70–99)

## 2021-06-25 SURGERY — COLONOSCOPY WITH PROPOFOL
Anesthesia: Monitor Anesthesia Care

## 2021-06-25 MED ORDER — SODIUM CHLORIDE 0.9 % IV SOLN: INTRAVENOUS | Status: DC

## 2021-06-25 MED ORDER — LACTATED RINGERS IV SOLN: INTRAVENOUS | Status: DC

## 2021-06-25 MED ORDER — PROPOFOL 500 MG/50ML IV EMUL
INTRAVENOUS | Status: DC | PRN
  Administered 2021-06-25: 125 ug/kg/min via INTRAVENOUS

## 2021-06-25 MED ORDER — PROPOFOL 500 MG/50ML IV EMUL
INTRAVENOUS | Status: AC
  Filled 2021-06-25: qty 50

## 2021-06-25 MED ORDER — LIDOCAINE 2% (20 MG/ML) 5 ML SYRINGE
INTRAMUSCULAR | Status: DC | PRN
  Administered 2021-06-25: 60 mg via INTRAVENOUS

## 2021-06-25 MED ORDER — PROPOFOL 10 MG/ML IV BOLUS
INTRAVENOUS | Status: DC | PRN
Start: 2021-06-25 — End: 2021-06-25
  Administered 2021-06-25: 30 mg via INTRAVENOUS
  Administered 2021-06-25: 20 mg via INTRAVENOUS
  Administered 2021-06-25: 30 mg via INTRAVENOUS

## 2021-06-25 SURGICAL SUPPLY — 22 items

## 2021-06-25 NOTE — Anesthesia Procedure Notes (Signed)
Procedure Name: MAC Date/Time: 06/25/2021 10:44 AM Performed by: Lollie Sails, CRNA Pre-anesthesia Checklist: Patient identified, Emergency Drugs available, Suction available, Patient being monitored and Timeout performed Oxygen Delivery Method: Simple face mask

## 2021-06-25 NOTE — Anesthesia Preprocedure Evaluation (Addendum)
Anesthesia Evaluation  Patient identified by MRN, date of birth, ID band Patient awake    Reviewed: Allergy & Precautions, NPO status , Patient's Chart, lab work & pertinent test results  History of Anesthesia Complications (+) DIFFICULT AIRWAY and history of anesthetic complications  Airway Mallampati: III  TM Distance: <3 FB Neck ROM: Full    Dental  (+) Teeth Intact, Dental Advisory Given   Pulmonary former smoker,    Pulmonary exam normal breath sounds clear to auscultation       Cardiovascular hypertension, Pt. on medications Normal cardiovascular exam Rhythm:Regular Rate:Normal     Neuro/Psych  Headaches, PSYCHIATRIC DISORDERS Anxiety Depression  Neuromuscular disease    GI/Hepatic negative GI ROS, Neg liver ROS,   Endo/Other  diabetes, Type 2, Oral Hypoglycemic Agents  Renal/GU negative Renal ROS     Musculoskeletal negative musculoskeletal ROS (+)   Abdominal   Peds  Hematology negative hematology ROS (+)   Anesthesia Other Findings Day of surgery medications reviewed with the patient.  Reproductive/Obstetrics                            Anesthesia Physical Anesthesia Plan  ASA: 2  Anesthesia Plan: MAC   Post-op Pain Management:    Induction: Intravenous  PONV Risk Score and Plan: 1 and Propofol infusion and Treatment may vary due to age or medical condition  Airway Management Planned: Nasal Cannula and Natural Airway  Additional Equipment:   Intra-op Plan:   Post-operative Plan:   Informed Consent: I have reviewed the patients History and Physical, chart, labs and discussed the procedure including the risks, benefits and alternatives for the proposed anesthesia with the patient or authorized representative who has indicated his/her understanding and acceptance.     Dental advisory given  Plan Discussed with: CRNA and Anesthesiologist  Anesthesia Plan Comments:  (Discussed risks/benefits/alternatives to MAC sedation including need for ventilatory support, hypotension, need for conversion to general anesthesia.  All patient questions answered.  Patient/guardian wishes to proceed.)        Anesthesia Quick Evaluation

## 2021-06-25 NOTE — Discharge Instructions (Signed)

## 2021-06-25 NOTE — H&P (Signed)
Referring Provider: No ref. provider found Primary Care Physician:  Dorothyann Peng, NP  Reason for Consultation:  Colon cancer screening   IMPRESSION:  Need for colon cancer screening History of difficult airway   PLAN: Colonoscopy    HPI: Dustin Henry is a 50 y.o. male referred for colon cancer screening. Scheduled at the hospital due to a history of a difficult airway.  No known family history of colon cancer or polyps. No family history of uterine/endometrial cancer, pancreatic cancer or gastric/stomach cancer.  GI ROS is negative.   Past Medical History:  Diagnosis Date   ALLERGIC RHINITIS 08/18/2007   Allergy    seasonal   Anxiety    BACK PAIN    herniated disc   Depression    takes Paxil daily   Diabetes mellitus without complication ([place])    Difficult intubation    09/05/10: difficult airway documented (ant cords vis with head up/cricoid press), MAC 4 used   History of kidney stones    HYPERLIPIDEMIA 02/12/2010   HYPERTENSION    takes Amlodipine and Lisinopril-HCTZ daily   IBS (irritable bowel syndrome)    MIGRAINE HEADACHE    takes Imitrex daily as needed   Neuromuscular disorder ([place])    nerve pain left leg post op back surgery   Nocturia     Past Surgical History:  Procedure Laterality Date   CYSTOSCOPY     FINGER SURGERY     POSTERIOR CERVICAL FUSION/FORAMINOTOMY N/A 11/22/2015   Procedure: TLIF L5-S1;  Surgeon: Melina Schools, MD;  Location: [facility];  Service: Orthopedics;  Laterality: N/A;   SHOULDER SURGERY Right 03/2018   Dr. Veverly Fells    Current Facility-Administered Medications  Medication Dose Route Frequency Provider Last Rate Last Admin   0.9 %  sodium chloride infusion   Intravenous Continuous Thornton Park, MD       lactated ringers infusion   Intravenous Continuous Thornton Park, MD 20 mL/hr at 06/25/21 0938 New Bag at 06/25/21 0938    Allergies as of 05/22/2021 - Review Complete 04/13/2021  Allergen Reaction Noted    Doxycycline  09/01/2008   Penicillins Other (See Comments)     Family History  Problem Relation Age of Onset   High blood pressure Father    Diabetes Father    Arthritis Father    Diabetes Paternal Grandmother    Heart disease Other        No family history   Colon cancer Neg Hx    Esophageal cancer Neg Hx    Colon polyps Neg Hx    Rectal cancer Neg Hx    Stomach cancer Neg Hx       Physical Exam: General:   Alert,  well-nourished, pleasant and cooperative in NAD Head:  Normocephalic and atraumatic. Eyes:  Sclera clear, no icterus.   Conjunctiva pink. Ears:  Normal auditory acuity. Nose:  No deformity, discharge,  or lesions. Mouth:  No deformity or lesions.   Neck:  Supple; no masses or thyromegaly. Lungs:  Clear throughout to auscultation.   No wheezes. Heart:  Regular rate and rhythm; no murmurs. Abdomen:  Soft, nontender, nondistended, normal bowel sounds, no rebound or guarding. No hepatosplenomegaly.   Rectal:  Deferred  Msk:  Symmetrical. No boney deformities LAD: No inguinal or umbilical LAD Extremities:  No clubbing or edema. Neurologic:  Alert and  oriented x4;  grossly nonfocal Skin:  Intact without significant lesions or rashes. Psych:  Alert and cooperative. Normal mood and affect.  Kyi Romanello L. Tarri Glenn, MD, MPH 06/25/2021, 10:04 AM

## 2021-06-25 NOTE — Anesthesia Postprocedure Evaluation (Signed)
Anesthesia Post Note  Patient: Dustin Henry  Procedure(s) Performed: COLONOSCOPY WITH PROPOFOL POLYPECTOMY     Patient location during evaluation: Endoscopy Anesthesia Type: MAC Level of consciousness: awake and alert Pain management: pain level controlled Vital Signs Assessment: post-procedure vital signs reviewed and stable Respiratory status: spontaneous breathing, nonlabored ventilation, respiratory function stable and patient connected to nasal cannula oxygen Cardiovascular status: stable and blood pressure returned to baseline Postop Assessment: no apparent nausea or vomiting Anesthetic complications: no   No notable events documented.  Last Vitals:  Vitals:   06/25/21 1120 06/25/21 1130  BP: 119/74 122/86  Pulse: 72 60  Resp: 16 10  Temp:    SpO2: 98% 97%    Last Pain:  Vitals:   06/25/21 1130  TempSrc:   PainSc: 0-No pain                 Catalina Gravel

## 2021-06-25 NOTE — Op Note (Signed)
[facility] Patient Name: Dustin Henry Procedure Date: 06/25/2021 MRN: [id number] Attending MD: Thornton Park MD, MD Date of Birth: Nov 08, 1971 CSN: [id number] Age: 50 Admit Type: Outpatient Procedure:                Colonoscopy Indications:              Screening for colorectal malignant neoplasm, This                            is the patient's first colonoscopy                           No known family history of colon cancer or polyps Providers:                Thornton Park MD, MD, Doristine Johns, RN,                            [place], Benetta Spar, Technician Referring MD:              Medicines:                Monitored Anesthesia Care Complications:            No immediate complications. Estimated blood loss:                            Minimal. Estimated Blood Loss:     Estimated blood loss was minimal. Procedure:                Pre-Anesthesia Assessment:                           - Prior to the procedure, a History and Physical                            was performed, and patient medications and                            allergies were reviewed. The patient's tolerance of                            previous anesthesia was also reviewed. The risks                            and benefits of the procedure and the sedation                            options and risks were discussed with the patient.                            All questions were answered, and informed consent                            was obtained. Prior Anticoagulants: The patient has  taken no previous anticoagulant or antiplatelet                            agents. ASA Grade Assessment: II - A patient with                            mild systemic disease. After reviewing the risks                            and benefits, the patient was deemed in                            satisfactory condition to undergo the procedure.                            After obtaining informed consent, the colonoscope                            was passed under direct vision. Throughout the                            procedure, the patient's blood pressure, pulse, and                            oxygen saturations were monitored continuously. The                            CF-HQ190L ([id number] Olympus colonoscope was                            introduced through the anus and advanced to the 3                            cm into the ileum. A second forward view of the                            right colon was peformed. The colonoscopy was                            performed without difficulty. The patient tolerated                            the procedure well. The quality of the bowel                            preparation was good. The terminal ileum, ileocecal                            valve, appendiceal orifice, and rectum were                            photographed. Scope In: 10:53:19 AM Scope Out: 11:09:10 AM Scope Withdrawal Time: 0 hours 15 minutes 9 seconds  Total Procedure Duration:  0 hours 15 minutes 50 seconds  Findings:      The perianal and digital rectal examinations were normal.      A 2 mm polyp was found in the rectum. The polyp was sessile. The polyp       was removed with a cold snare. Resection and retrieval were complete.       Estimated blood loss was minimal.      Three sessile polyps were found in the descending colon. The polyps were       2 to 5 mm in size. These polyps were removed with a cold snare.       Resection and retrieval were complete. Estimated blood loss was minimal.      The exam was otherwise without abnormality on direct and retroflexion       views. Impression:               - One 2 mm polyp in the rectum, removed with a cold                            snare. Resected and retrieved.                           - Three 2 to 3 mm polyps in the descending colon,                            removed with a cold  snare. Resected and retrieved.                           - The examination was otherwise normal on direct                            and retroflexion views. Moderate Sedation:      Not Applicable - Patient had care per Anesthesia. Recommendation:           - Patient has a contact number available for                            emergencies. The signs and symptoms of potential                            delayed complications were discussed with the                            patient. Return to normal activities tomorrow.                            Written discharge instructions were provided to the                            patient.                           - Resume previous diet.                           - Continue present medications.                           -  Await pathology results.                           - Repeat colonoscopy date to be determined after                            pending pathology results are reviewed for                            surveillance.                           - Emerging evidence supports eating a diet of                            fruits, vegetables, grains, calcium, and yogurt                            while reducing red meat and alcohol may reduce the                            risk of colon cancer.                           - Thank you for allowing me to be involved in your                            colon cancer prevention. Procedure Code(s):        --- Professional ---                           [phone], Colonoscopy, flexible; with removal of                            tumor(s), polyp(s), or other lesion(s) by snare                            technique Diagnosis Code(s):        --- Professional ---                           Z12.11, Encounter for screening for malignant                            neoplasm of colon                           K62.1, Rectal polyp                           K63.5, Polyp of colon CPT copyright 2019 American Medical  Association. All rights reserved. The codes documented in this report are preliminary and upon coder review may  be revised to meet current compliance requirements. Thornton Park MD, MD 06/25/2021 11:14:34 AM This report has been signed electronically. Number of Addenda: 0

## 2021-06-25 NOTE — Transfer of Care (Signed)
Immediate Anesthesia Transfer of Care Note  Patient: Dustin Henry  Procedure(s) Performed: COLONOSCOPY WITH PROPOFOL POLYPECTOMY  Patient Location: PACU and Endoscopy Unit  Anesthesia Type:MAC  Level of Consciousness: awake, alert  and oriented  Airway & Oxygen Therapy: Patient Spontanous Breathing  Post-op Assessment: Report given to RN and Post -op Vital signs reviewed and stable  Post vital signs: Reviewed and stable  Last Vitals:  Vitals Value Taken Time  BP    Temp    Pulse 76 06/25/21 1113  Resp 17 06/25/21 1113  SpO2 99 % 06/25/21 1113  Vitals shown include unvalidated device data.  Last Pain:  Vitals:   06/25/21 0932  TempSrc: Temporal  PainSc: 5          Complications: No notable events documented.

## 2021-06-26 ENCOUNTER — Encounter: Payer: Self-pay | Admitting: Gastroenterology

## 2021-06-26 ENCOUNTER — Encounter (HOSPITAL_COMMUNITY): Payer: Self-pay | Admitting: Gastroenterology

## 2021-06-26 LAB — SURGICAL PATHOLOGY

## 2021-07-09 ENCOUNTER — Other Ambulatory Visit: Payer: Self-pay

## 2021-07-10 ENCOUNTER — Encounter: Payer: Self-pay | Admitting: Adult Health

## 2021-07-10 ENCOUNTER — Ambulatory Visit: Payer: 59 | Admitting: Adult Health

## 2021-07-10 VITALS — BP 122/80 | HR 109 | Temp 98.5°F | Ht 70.0 in | Wt 177.0 lb

## 2021-07-10 DIAGNOSIS — I10 Essential (primary) hypertension: Secondary | ICD-10-CM

## 2021-07-10 DIAGNOSIS — E1165 Type 2 diabetes mellitus with hyperglycemia: Secondary | ICD-10-CM | POA: Diagnosis not present

## 2021-07-10 LAB — POCT GLYCOSYLATED HEMOGLOBIN (HGB A1C): Hemoglobin A1C: 7.5 % — AB (ref 4.0–5.6)

## 2021-07-10 MED ORDER — METFORMIN HCL 1000 MG PO TABS: 1000.0000 mg | ORAL_TABLET | Freq: Two times a day (BID) | ORAL | 2 refills | Status: DC

## 2021-07-10 NOTE — Progress Notes (Signed)
Subjective:    Patient ID: Dustin Henry, male    DOB: Jan 19, 1971, 50 y.o.   MRN: [id number]  HPI 50 year old male who  has a past medical history of ALLERGIC RHINITIS (08/18/2007), Allergy, Anxiety, BACK PAIN, Depression, Diabetes mellitus without complication ([place]), Difficult intubation, History of kidney stones, HYPERLIPIDEMIA (02/12/2010), HYPERTENSION, IBS (irritable bowel syndrome), MIGRAINE HEADACHE, Neuromuscular disorder ([place]), and Nocturia.  He presents to the office today for follow-up regarding diabetes/HTN  Diabetes mellitus-newly diagnosed in January 10, 2021.  At this time his A1c was 8.4.  He was placed on metformin 1000 mg twice daily and had started making lifestyle modifications such as cutting back on sodas and sweets.  When he was last seen over the 2 weeks prior he had "fallen off the wagon" and had not been taking his medications nor checking his blood sugars.  Today he reports that he is taking his medications but his diet has been poor. He is staying active with his [place] and has been able to lose weight. Is not checking his BS at home.   Lab Results  Component Value Date   HGBA1C 7.0 (A) 04/13/2021   HTN -prescribed Norvasc 5 mg and lisinopril/hydrochlorothiazide 20-25 mg.  Denies dizziness, lightheadedness, chest pain, or shortness of breath. BP Readings from Last 3 Encounters:  07/10/21 122/80  06/25/21 122/86  04/13/21 (!) 144/86   Review of Systems See HPI   Past Medical History:  Diagnosis Date   ALLERGIC RHINITIS 08/18/2007   Allergy    seasonal   Anxiety    BACK PAIN    herniated disc   Depression    takes Paxil daily   Diabetes mellitus without complication ([place])    Difficult intubation    09/05/10: difficult airway documented (ant cords vis with head up/cricoid press), MAC 4 used   History of kidney stones    HYPERLIPIDEMIA 02/12/2010   HYPERTENSION    takes Amlodipine and Lisinopril-HCTZ daily   IBS (irritable bowel  syndrome)    MIGRAINE HEADACHE    takes Imitrex daily as needed   Neuromuscular disorder (HCC)    nerve pain left leg post op back surgery   Nocturia     Social History   Socioeconomic History   Marital status: Married    Spouse name: Not on file   Number of children: 1   Years of education: Not on file   Highest education level: Not on file  Occupational History   Occupation: pressman helper    Comment: Engineer, structural  Tobacco Use   Smoking status: Former    Packs/day: 3.00    Years: 10.00    Pack years: 30.00    Types: Cigarettes   Smokeless tobacco: Never   Tobacco comments:    quit smoking about 32yr ago  Substance and Sexual Activity   Alcohol use: Yes    Comment: occasionally   Drug use: No   Sexual activity: Not on file  Other Topics Concern   Not on file  Social History Narrative   Not on file   Social Determinants of Health   Financial Resource Strain: Not on file  Food Insecurity: Not on file  Transportation Needs: Not on file  Physical Activity: Not on file  Stress: Not on file  Social Connections: Not on file  Intimate Partner Violence: Not on file    Past Surgical History:  Procedure Laterality Date   COLONOSCOPY WITH PROPOFOL N/A 06/25/2021   Procedure: COLONOSCOPY WITH PROPOFOL;  Surgeon: Thornton Park, MD;  Location: [facility];  Service: Gastroenterology;  Laterality: N/A;   CYSTOSCOPY     FINGER SURGERY     POLYPECTOMY  06/25/2021   Procedure: POLYPECTOMY;  Surgeon: Thornton Park, MD;  Location: [facility];  Service: Gastroenterology;;   POSTERIOR CERVICAL FUSION/FORAMINOTOMY N/A 11/22/2015   Procedure: TLIF L5-S1;  Surgeon: Melina Schools, MD;  Location: [facility];  Service: Orthopedics;  Laterality: N/A;   SHOULDER SURGERY Right 03/2018   Dr. Veverly Fells    Family History  Problem Relation Age of Onset   High blood pressure Father    Diabetes Father    Arthritis Father    Diabetes Paternal Grandmother    Heart disease Other         No family history   Colon cancer Neg Hx    Esophageal cancer Neg Hx    Colon polyps Neg Hx    Rectal cancer Neg Hx    Stomach cancer Neg Hx       Current Outpatient Medications on File Prior to Visit  Medication Sig Dispense Refill   amitriptyline (ELAVIL) 25 MG tablet TAKE 1 TABLET(25 MG) BY MOUTH AT BEDTIME 30 tablet 3   amLODipine (NORVASC) 5 MG tablet TAKE 1 TABLET(5 MG) BY MOUTH DAILY 30 tablet 11   atorvastatin (LIPITOR) 40 MG tablet Take 1 tablet (40 mg total) by mouth daily. 30 tablet 11   Blood Glucose Monitoring Suppl (ONE TOUCH ULTRA 2) w/Device KIT 1 kit by Does not apply route in the morning, at noon, and at bedtime. 1 kit 0   cetirizine (ZYRTEC) 10 MG tablet Take 10 mg by mouth in the morning.     gabapentin (NEURONTIN) 800 MG tablet Take 800 mg by mouth 3 (three) times daily as needed (pain).  2   glucose blood test strip Use as instructed 300 each 6   HYDROcodone-acetaminophen (NORCO/VICODIN) 5-325 MG tablet Take 1 tablet by mouth in the morning, at noon, and at bedtime.  0   hydrOXYzine (ATARAX/VISTARIL) 10 MG tablet TAKE 1 TABLET(10 MG) BY MOUTH THREE TIMES DAILY AS NEEDED 90 tablet 0   ibuprofen (ADVIL) 200 MG tablet Take 800 mg by mouth 2 (two) times daily as needed for moderate pain.     Lancets (ONETOUCH ULTRASOFT) lancets Use as instructed 300 each 6   lisinopril-hydrochlorothiazide (ZESTORETIC) 20-25 MG tablet Take 1 tablet by mouth daily. 30 tablet 11   promethazine (PHENERGAN) 25 MG tablet Take one tablet every 8 hours as needed 30 tablet 1   SUMAtriptan (IMITREX) 100 MG tablet Take 1 tablet (100 mg total) by mouth every 2 (two) hours as needed for migraine. May repeat in 2 hours if headache persists or recurs. 15 tablet 3   metFORMIN (GLUCOPHAGE) 500 MG tablet Take 1 tablet (500 mg total) by mouth 2 (two) times daily with a meal. 60 tablet 2   PARoxetine (PAXIL) 40 MG tablet Take 1 tablet (40 mg total) by mouth daily. 30 tablet 6   peg 3350 powder (MOVIPREP)  100 g SOLR Take by mouth.     No current facility-administered medications on file prior to visit.    BP 122/80   Pulse (!) 109   Temp 98.5 F (36.9 C) (Oral)   Ht 5' 10" (1.778 m)   Wt 177 lb (80.3 kg)   SpO2 97%   BMI 25.40 kg/m       Objective:   Physical Exam Vitals and nursing note reviewed.  Constitutional:  Appearance: Normal appearance.  Cardiovascular:     Rate and Rhythm: Normal rate and regular rhythm.     Pulses: Normal pulses.     Heart sounds: Normal heart sounds.  Pulmonary:     Effort: Pulmonary effort is normal.     Breath sounds: Normal breath sounds.  Musculoskeletal:        General: Normal range of motion.  Skin:    General: Skin is warm and dry.  Neurological:     General: No focal deficit present.     Mental Status: He is alert and oriented to person, place, and time.  Psychiatric:        Mood and Affect: Mood normal.        Behavior: Behavior normal.        Thought Content: Thought content normal.        Judgment: Judgment normal.       Assessment & Plan:  1. Uncontrolled type 2 diabetes mellitus with hyperglycemia (HCC)  - POC HgB A1c- 7.5 - has increased. Will increase Metformin. Work on diet and exercise. Check blood sugars at home  - Follow up in three months  - metFORMIN (GLUCOPHAGE) 1000 MG tablet; Take 1 tablet (1,000 mg total) by mouth 2 (two) times daily with a meal.  Dispense: 60 tablet; Refill: 2  2. Essential hypertension - Controlled.  - No change in medications   Dorothyann Peng, NP

## 2021-07-13 ENCOUNTER — Ambulatory Visit: Payer: 59 | Admitting: Adult Health

## 2021-07-22 ENCOUNTER — Other Ambulatory Visit: Payer: Self-pay | Admitting: Adult Health

## 2021-07-22 DIAGNOSIS — R110 Nausea: Secondary | ICD-10-CM

## 2021-07-24 ENCOUNTER — Ambulatory Visit: Payer: 59 | Admitting: Adult Health

## 2021-07-31 ENCOUNTER — Ambulatory Visit: Payer: 59 | Admitting: Adult Health

## 2021-10-09 ENCOUNTER — Ambulatory Visit: Payer: 59 | Admitting: Adult Health

## 2021-10-16 ENCOUNTER — Ambulatory Visit: Payer: 59 | Admitting: Adult Health

## 2021-10-23 ENCOUNTER — Ambulatory Visit: Payer: 59 | Admitting: Adult Health

## 2021-11-12 ENCOUNTER — Telehealth: Payer: Self-pay | Admitting: Adult Health

## 2021-11-12 NOTE — Telephone Encounter (Signed)
Patient calling in with respiratory symptoms: Shortness of breath, chest pain, palpitations or other red words send to Triage  Does the patient have a fever over 100, cough, congestion, sore throat, runny nose, lost of taste/smell within the last 5 days (please list symptoms that patient has) sore throat, cough  Have you tested for Covid in the last 5 days? Yes   If yes, was it positive []  OR negative [] ? If positive in the last 5 days, please schedule virtual visit now. If negative, schedule for an in person OV with the next available provider if PCP has no openings. Please also let patient know they will be tested again (follow the script below)  "you will have to arrive 51mins prior to your appt time to be Covid tested. Please park in back of office at the [facility] & call [phone] to let the staff know you have arrived. A staff member will meet you at your car to do a rapid covid test. Once the test has resulted you will be notified by phone of your results to determine if appt will remain an in person visit or be converted to a virtual/phone visit. If you arrive less than 64mins before your appt time, your visit will be automatically converted to virtual & any recommended testing will happen AFTER the visit."  Pt has an appt with dr Maudie Mercury 11-13-2021 at 1 pm [place]  If no availability for virtual visit in office,  please schedule another [place] office  If no availability at another [place] office, please instruct patient that they can schedule an evisit or virtual visit through their mychart account. Visits up to 8pm  patients can be seen in office 5 days after positive COVID test   y

## 2021-11-13 ENCOUNTER — Encounter: Payer: Self-pay | Admitting: Family Medicine

## 2021-11-13 ENCOUNTER — Telehealth (INDEPENDENT_AMBULATORY_CARE_PROVIDER_SITE_OTHER): Payer: 59 | Admitting: Family Medicine

## 2021-11-13 DIAGNOSIS — R0981 Nasal congestion: Secondary | ICD-10-CM | POA: Diagnosis not present

## 2021-11-13 DIAGNOSIS — R059 Cough, unspecified: Secondary | ICD-10-CM

## 2021-11-13 MED ORDER — PROMETHAZINE-DM 6.25-15 MG/5ML PO SYRP: 5.0000 mL | ORAL_SOLUTION | Freq: Three times a day (TID) | ORAL | 0 refills | Status: DC | PRN

## 2021-11-13 NOTE — Patient Instructions (Addendum)
  HOME CARE TIPS:  -I sent the medication(s) we discussed to your pharmacy: Meds ordered this encounter  Medications   promethazine-dextromethorphan (PROMETHAZINE-DM) 6.25-15 MG/5ML syrup    Sig: Take 5 mLs by mouth 3 (three) times daily as needed for cough.    Dispense:  118 mL    Refill:  0     -gargling warm salt water and warm liquids can sometimes help the sore throat  -can use tylenol or aleve if needed for fevers, sore throat, aches and pains per instructions  -can use nasal saline a few times per day if you have nasal congestion  -sometimes  a short course of Afrin nasal spray for 3 days can help with symptoms as well  -stay hydrated, drink plenty of fluids and eat small healthy meals - avoid dairy   -follow up with your doctor in 2-3 days unless improving and feeling better   It was nice to meet you today, and I really hope you are feeling better soon. I help [place] [facility] out with telemedicine visits on Tuesdays and Thursdays and am available for visits on those days. If you have any concerns or questions following this visit please schedule a follow up visit with your Primary Care doctor or seek care at a local urgent care clinic to avoid delays in care.    Seek in person care or schedule a follow up video visit promptly if your symptoms worsen, new concerns arise or you are not improving with treatment. Call 911 and/or seek emergency care if your symptoms are severe or life threatening.

## 2021-11-13 NOTE — Progress Notes (Signed)
Virtual Visit via Video Note  I connected with Dustin Henry  on 11/13/21 at  1:00 PM EST by a video enabled telemedicine application and verified that I am speaking with the correct person using two identifiers.  Location patient: car, [place] Location provider:work or home office Persons participating in the virtual visit: patient, provider  I discussed the limitations of evaluation and management by telemedicine and the availability of in person appointments. The patient expressed understanding and agreed to proceed.   HPI:  Acute telemedicine visit for flu like symptoms: -Onset: 5 days ago -Symptoms include: sore throat, nasal congestion, PND, cough, diarrhea -wife is sick currently -home covid test negative x2  -Denies: fevers, body aches, vomiting, CP, SOB -Has tried: musinex -Pertinent past medical history:see below -Pertinent medication allergies:  Allergies  Allergen Reactions   Doxycycline Nausea Only   Other     Cough medication "starts with a B made him sick", cannot recall name or exact reaction   Penicillins Other (See Comments)      -COVID-19 vaccine status:  Immunization History  Administered Date(s) Administered   Influenza,inj,quad, With Preservative 09/28/2019   PFIZER(Purple Top)SARS-COV-2 Vaccination 01/10/2020, 02/03/2020   Tdap 02/24/2019     ROS: See pertinent positives and negatives per HPI.  Past Medical History:  Diagnosis Date   ALLERGIC RHINITIS 08/18/2007   Allergy    seasonal   Anxiety    BACK PAIN    herniated disc   Depression    takes Paxil daily   Diabetes mellitus without complication ([place])    Difficult intubation    09/05/10: difficult airway documented (ant cords vis with head up/cricoid press), MAC 4 used   History of kidney stones    HYPERLIPIDEMIA 02/12/2010   HYPERTENSION    takes Amlodipine and Lisinopril-HCTZ daily   IBS (irritable bowel syndrome)    MIGRAINE HEADACHE    takes Imitrex daily as needed   Neuromuscular  disorder ([place])    nerve pain left leg post op back surgery   Nocturia     Past Surgical History:  Procedure Laterality Date   COLONOSCOPY WITH PROPOFOL N/A 06/25/2021   Procedure: COLONOSCOPY WITH PROPOFOL;  Surgeon: Thornton Park, MD;  Location: [facility];  Service: Gastroenterology;  Laterality: N/A;   CYSTOSCOPY     FINGER SURGERY     POLYPECTOMY  06/25/2021   Procedure: POLYPECTOMY;  Surgeon: Thornton Park, MD;  Location: [facility];  Service: Gastroenterology;;   POSTERIOR CERVICAL FUSION/FORAMINOTOMY N/A 11/22/2015   Procedure: TLIF L5-S1;  Surgeon: Melina Schools, MD;  Location: [facility];  Service: Orthopedics;  Laterality: N/A;   SHOULDER SURGERY Right 03/2018   Dr. Veverly Fells     Current Outpatient Medications:    amitriptyline (ELAVIL) 25 MG tablet, TAKE 1 TABLET(25 MG) BY MOUTH AT BEDTIME, Disp: 30 tablet, Rfl: 3   amLODipine (NORVASC) 5 MG tablet, TAKE 1 TABLET(5 MG) BY MOUTH DAILY, Disp: 30 tablet, Rfl: 11   atorvastatin (LIPITOR) 40 MG tablet, Take 1 tablet (40 mg total) by mouth daily., Disp: 30 tablet, Rfl: 11   Blood Glucose Monitoring Suppl (ONE TOUCH ULTRA 2) w/Device KIT, 1 kit by Does not apply route in the morning, at noon, and at bedtime., Disp: 1 kit, Rfl: 0   cetirizine (ZYRTEC) 10 MG tablet, Take 10 mg by mouth in the morning., Disp: , Rfl:    gabapentin (NEURONTIN) 800 MG tablet, Take 800 mg by mouth 2 (two) times daily., Disp: , Rfl: 2   glucose blood test strip, Use  as instructed, Disp: 300 each, Rfl: 6   HYDROcodone-acetaminophen (NORCO/VICODIN) 5-325 MG tablet, Take 1 tablet by mouth in the morning, at noon, and at bedtime., Disp: , Rfl: 0   hydrOXYzine (ATARAX/VISTARIL) 10 MG tablet, TAKE 1 TABLET(10 MG) BY MOUTH THREE TIMES DAILY AS NEEDED, Disp: 90 tablet, Rfl: 0   ibuprofen (ADVIL) 200 MG tablet, Take 800 mg by mouth 2 (two) times daily as needed for moderate pain., Disp: , Rfl:    Lancets (ONETOUCH ULTRASOFT) lancets, Use as instructed, Disp: 300  each, Rfl: 6   lisinopril-hydrochlorothiazide (ZESTORETIC) 20-25 MG tablet, Take 1 tablet by mouth daily., Disp: 30 tablet, Rfl: 11   promethazine (PHENERGAN) 25 MG tablet, TAKE 1 TABLET BY MOUTH EVERY 8 HOURS AS NEEDED, Disp: 30 tablet, Rfl: 1   SUMAtriptan (IMITREX) 100 MG tablet, Take 1 tablet (100 mg total) by mouth every 2 (two) hours as needed for migraine. May repeat in 2 hours if headache persists or recurs., Disp: 15 tablet, Rfl: 3   metFORMIN (GLUCOPHAGE) 1000 MG tablet, Take 1 tablet (1,000 mg total) by mouth 2 (two) times daily with a meal., Disp: 60 tablet, Rfl: 2   PARoxetine (PAXIL) 40 MG tablet, Take 1 tablet (40 mg total) by mouth daily., Disp: 30 tablet, Rfl: 6   promethazine-dextromethorphan (PROMETHAZINE-DM) 6.25-15 MG/5ML syrup, Take 5 mLs by mouth 3 (three) times daily as needed for cough., Disp: 118 mL, Rfl: 0  EXAM:  VITALS per patient if applicable:  GENERAL: alert, oriented, appears well and in no acute distress  HEENT: atraumatic, conjunttiva clear, no obvious abnormalities on inspection of external nose and ears  NECK: normal movements of the head and neck  LUNGS: on inspection no signs of respiratory distress, breathing rate appears normal, no obvious gross SOB, gasping or wheezing  CV: no obvious cyanosis  MS: moves all visible extremities without noticeable abnormality  PSYCH/NEURO: pleasant and cooperative, no obvious depression or anxiety, speech and thought processing grossly intact  ASSESSMENT AND PLAN:  Discussed the following assessment and plan:  Cough, unspecified type  Nasal congestion  -we discussed possible serious and likely etiologies, options for evaluation and workup, limitations of telemedicine visit vs in person visit, treatment, treatment risks and precautions. Pt is agreeable to treatment via telemedicine at this moment. Query V[facility], vs other. He opted for an Rx cough syrup and other symptomatic care measures per patient instructions.  . Advised to seek prompt in person care if worsening, new symptoms arise, or if is not improving with treatment. Discussed options for inperson care if PCP office not available. Did let this patient know that I only do telemedicine on Tuesdays and Thursdays for [place]. Advised to schedule follow up visit with PCP or [facility] if any further questions or concerns to avoid delays in care.   I discussed the assessment and treatment plan with the patient. The patient was provided an opportunity to ask questions and all were answered. The patient agreed with the plan and demonstrated an understanding of the instructions.     Lucretia Kern, DO

## 2021-11-15 ENCOUNTER — Encounter: Payer: Self-pay | Admitting: Adult Health

## 2021-11-15 ENCOUNTER — Telehealth: Payer: Self-pay

## 2021-11-15 ENCOUNTER — Encounter: Payer: Self-pay | Admitting: Family Medicine

## 2021-11-15 NOTE — Telephone Encounter (Signed)
Patient called to schedule an appt and no appts available until next week, so patient wants to know if he can be worked in today or tomorrow

## 2021-11-15 NOTE — Telephone Encounter (Signed)
Pt stated that he was sick 7 days ago. Pt stated he had diarrhea sore throat and cough. Pt stated he has a tele health with Dr.Kim at that time he had sore throat and cough. Was given promethazine and sore throat went away. Pt stated that cough is worse now and the sore throat is back. Pt stated he took 2 Covid test and they was negative. Pt stated that he feels better today but he needs something to relieve the sore throat and cough. Pt stated he had delsym but no relief. Pt is wanting an Rx called in.

## 2021-11-16 ENCOUNTER — Telehealth: Payer: 59 | Admitting: Family Medicine

## 2021-11-16 ENCOUNTER — Other Ambulatory Visit: Payer: Self-pay | Admitting: Adult Health

## 2021-11-16 ENCOUNTER — Ambulatory Visit: Payer: 59 | Admitting: Adult Health

## 2021-11-16 ENCOUNTER — Encounter: Payer: Self-pay | Admitting: Adult Health

## 2021-11-16 VITALS — BP 160/100 | HR 97 | Temp 98.5°F | Ht 70.0 in | Wt 188.0 lb

## 2021-11-16 DIAGNOSIS — J069 Acute upper respiratory infection, unspecified: Secondary | ICD-10-CM

## 2021-11-16 DIAGNOSIS — J029 Acute pharyngitis, unspecified: Secondary | ICD-10-CM | POA: Diagnosis not present

## 2021-11-16 MED ORDER — AZITHROMYCIN 250 MG PO TABS: ORAL_TABLET | ORAL | 0 refills | Status: AC

## 2021-11-16 MED ORDER — PREDNISONE 10 MG PO TABS: ORAL_TABLET | ORAL | 0 refills | Status: DC

## 2021-11-16 MED ORDER — ALBUTEROL SULFATE HFA 108 (90 BASE) MCG/ACT IN AERS: 2.0000 | INHALATION_SPRAY | Freq: Four times a day (QID) | RESPIRATORY_TRACT | 0 refills | Status: DC | PRN

## 2021-11-16 MED ORDER — MAGIC MOUTHWASH W/LIDOCAINE: 5.0000 mL | Freq: Three times a day (TID) | ORAL | 0 refills | Status: DC | PRN

## 2021-11-16 NOTE — Telephone Encounter (Signed)
Pt has been scheduled for today due to a cancellation a spot was available.

## 2021-11-16 NOTE — Progress Notes (Signed)
Subjective:    Patient ID: Dustin Henry, male    DOB: May 18, 1971, 50 y.o.   MRN: [id number]  HPI  50 year old male who  has a past medical history of ALLERGIC RHINITIS (08/18/2007), Allergy, Anxiety, BACK PAIN, Depression, Diabetes mellitus without complication ([place]), Difficult intubation, History of kidney stones, HYPERLIPIDEMIA (02/12/2010), HYPERTENSION, IBS (irritable bowel syndrome), MIGRAINE HEADACHE, Neuromuscular disorder ([place]), and Nocturia.  He presents to the office today for an acute issue.  He was seen 3 days ago virtually by another provider for sore throat, nasal congestion, cough.  His symptoms started 5 days prior.  He was thought that his symptoms were viral and he was prescribed Promethazine DM cough syrup.  Today he reports that his symptoms have not improved and the cough syrup does not seem to be working.  He is getting very little sleep due to cough keeping him awake.  He continues to have a sore throat that feels like "fire when I swallow".  His nasal congestion but no sinus pain or pressure.  Has not had any fevers chills.  Is also been doing warm salt water gargles and Delsym cough syrup which does not seem to be helping.  Review of Systems See HPI   Past Medical History:  Diagnosis Date   ALLERGIC RHINITIS 08/18/2007   Allergy    seasonal   Anxiety    BACK PAIN    herniated disc   Depression    takes Paxil daily   Diabetes mellitus without complication ([place])    Difficult intubation    09/05/10: difficult airway documented (ant cords vis with head up/cricoid press), MAC 4 used   History of kidney stones    HYPERLIPIDEMIA 02/12/2010   HYPERTENSION    takes Amlodipine and Lisinopril-HCTZ daily   IBS (irritable bowel syndrome)    MIGRAINE HEADACHE    takes Imitrex daily as needed   Neuromuscular disorder (HCC)    nerve pain left leg post op back surgery   Nocturia     Social History   Socioeconomic History   Marital status: Married    Spouse  name: Not on file   Number of children: 1   Years of education: Not on file   Highest education level: Not on file  Occupational History   Occupation: pressman helper    Comment: Engineer, structural  Tobacco Use   Smoking status: Former    Packs/day: 3.00    Years: 10.00    Pack years: 30.00    Types: Cigarettes   Smokeless tobacco: Never   Tobacco comments:    quit smoking about 60yr ago  Substance and Sexual Activity   Alcohol use: Yes    Comment: occasionally   Drug use: No   Sexual activity: Not on file  Other Topics Concern   Not on file  Social History Narrative   Not on file   Social Determinants of Health   Financial Resource Strain: Not on file  Food Insecurity: Not on file  Transportation Needs: Not on file  Physical Activity: Not on file  Stress: Not on file  Social Connections: Not on file  Intimate Partner Violence: Not on file    Past Surgical History:  Procedure Laterality Date   COLONOSCOPY WITH PROPOFOL N/A 06/25/2021   Procedure: COLONOSCOPY WITH PROPOFOL;  Surgeon: BThornton Park MD;  Location: [facility];  Service: Gastroenterology;  Laterality: N/A;   CYSTOSCOPY     FINGER SURGERY     POLYPECTOMY  06/25/2021  Procedure: POLYPECTOMY;  Surgeon: Thornton Park, MD;  Location: [facility];  Service: Gastroenterology;;   POSTERIOR CERVICAL FUSION/FORAMINOTOMY N/A 11/22/2015   Procedure: TLIF L5-S1;  Surgeon: Melina Schools, MD;  Location: [place];  Service: Orthopedics;  Laterality: N/A;   SHOULDER SURGERY Right 03/2018   Dr. Veverly Fells    Family History  Problem Relation Age of Onset   High blood pressure Father    Diabetes Father    Arthritis Father    Diabetes Paternal Grandmother    Heart disease Other        No family history   Colon cancer Neg Hx    Esophageal cancer Neg Hx    Colon polyps Neg Hx    Rectal cancer Neg Hx    Stomach cancer Neg Hx       Current Outpatient Medications on File Prior to Visit  Medication Sig Dispense  Refill   amitriptyline (ELAVIL) 25 MG tablet TAKE 1 TABLET(25 MG) BY MOUTH AT BEDTIME 30 tablet 3   amLODipine (NORVASC) 5 MG tablet TAKE 1 TABLET(5 MG) BY MOUTH DAILY 30 tablet 11   atorvastatin (LIPITOR) 40 MG tablet Take 1 tablet (40 mg total) by mouth daily. 30 tablet 11   Blood Glucose Monitoring Suppl (ONE TOUCH ULTRA 2) w/Device KIT 1 kit by Does not apply route in the morning, at noon, and at bedtime. 1 kit 0   cetirizine (ZYRTEC) 10 MG tablet Take 10 mg by mouth in the morning.     gabapentin (NEURONTIN) 800 MG tablet Take 800 mg by mouth 2 (two) times daily.  2   glucose blood test strip Use as instructed 300 each 6   HYDROcodone-acetaminophen (NORCO/VICODIN) 5-325 MG tablet Take 1 tablet by mouth in the morning, at noon, and at bedtime.  0   hydrOXYzine (ATARAX/VISTARIL) 10 MG tablet TAKE 1 TABLET(10 MG) BY MOUTH THREE TIMES DAILY AS NEEDED 90 tablet 0   ibuprofen (ADVIL) 200 MG tablet Take 800 mg by mouth 2 (two) times daily as needed for moderate pain.     Lancets (ONETOUCH ULTRASOFT) lancets Use as instructed 300 each 6   lisinopril-hydrochlorothiazide (ZESTORETIC) 20-25 MG tablet Take 1 tablet by mouth daily. 30 tablet 11   promethazine (PHENERGAN) 25 MG tablet TAKE 1 TABLET BY MOUTH EVERY 8 HOURS AS NEEDED 30 tablet 1   SUMAtriptan (IMITREX) 100 MG tablet Take 1 tablet (100 mg total) by mouth every 2 (two) hours as needed for migraine. May repeat in 2 hours if headache persists or recurs. 15 tablet 3   metFORMIN (GLUCOPHAGE) 1000 MG tablet Take 1 tablet (1,000 mg total) by mouth 2 (two) times daily with a meal. 60 tablet 2   PARoxetine (PAXIL) 40 MG tablet Take 1 tablet (40 mg total) by mouth daily. 30 tablet 6   No current facility-administered medications on file prior to visit.    BP (!) 160/100   Pulse 97   Temp 98.5 F (36.9 C) (Oral)   Ht _0  (1.778 m)   Wt 188 lb (85.3 kg)   SpO2 98%   BMI 26.98 kg/m       Objective:   Physical Exam Vitals and nursing note  reviewed.  Constitutional:      Appearance: Normal appearance.  HENT:     Nose: Nose normal. No congestion or rhinorrhea.     Mouth/Throat:     Mouth: Mucous membranes are moist.     Pharynx: Oropharynx is clear. No oropharyngeal exudate or posterior oropharyngeal erythema.  Tonsils: No tonsillar exudate or tonsillar abscesses. 0 on the right. 0 on the left.  Cardiovascular:     Rate and Rhythm: Normal rate and regular rhythm.     Pulses: Normal pulses.     Heart sounds: Normal heart sounds.  Pulmonary:     Effort: Pulmonary effort is normal.     Breath sounds: Normal breath sounds. No stridor. No wheezing or rhonchi.  Skin:    General: Skin is warm and dry.     Capillary Refill: Capillary refill takes less than 2 seconds.  Neurological:     General: No focal deficit present.     Mental Status: He is alert and oriented to person, place, and time.  Psychiatric:        Mood and Affect: Mood normal.        Behavior: Behavior normal.        Thought Content: Thought content normal.        Judgment: Judgment normal.       Assessment & Plan:  1. Viral URI with cough -Appears more viral in nature.  He does have a pain contract in place with Dr. Herma Mering, will forego Hycodan cough syrup at this time.  Prescribed prednisone, albuterol and azithromycin to cover for bacterial infection due to duration of symptoms.  He was advised that prednisone will cause his blood sugars to increase, he needs to drink more water while taking the medication as well as stay away from carbs and sugars. - predniSONE (DELTASONE) 10 MG tablet; 40 mg x 3 days, 20 mg x 3 days, 10 mg x 3 days  Dispense: 21 tablet; Refill: 0 - albuterol (VENTOLIN HFA) 108 (90 Base) MCG/ACT inhaler; Inhale 2 puffs into the lungs every 6 (six) hours as needed for wheezing or shortness of breath.  Dispense: 8 g; Refill: 0 - azithromycin (ZITHROMAX) 250 MG tablet; Take 2 tablets on day 1, then 1 tablet daily on days 2 through 5   Dispense: 6 tablet; Refill: 0  2. Sore throat -No signs of strep.  Will provide Magic mouthwash with lidocaine. - magic mouthwash w/lidocaine SOLN; Take 5 mLs by mouth 3 (three) times daily as needed. Gargle and spit  Dispense: 180 mL; Refill: 0 Follow-up if symptoms not resolving in the next 4 to 5 days  Dorothyann Peng, NP

## 2021-11-17 ENCOUNTER — Other Ambulatory Visit: Payer: Self-pay | Admitting: Adult Health

## 2021-11-17 DIAGNOSIS — F411 Generalized anxiety disorder: Secondary | ICD-10-CM

## 2021-12-04 ENCOUNTER — Ambulatory Visit: Payer: 59 | Admitting: Adult Health

## 2021-12-04 NOTE — Progress Notes (Deleted)
Subjective:    Patient ID: Dustin Henry, male    DOB: February 05, 1971, 50 y.o.   MRN: [id number]  HPI  50 year old male who  has a past medical history of ALLERGIC RHINITIS (08/18/2007), Allergy, Anxiety, BACK PAIN, Depression, Diabetes mellitus without complication ([place]), Difficult intubation, History of kidney stones, HYPERLIPIDEMIA (02/12/2010), HYPERTENSION, IBS (irritable bowel syndrome), MIGRAINE HEADACHE, Neuromuscular disorder ([place]), and Nocturia.  He presents to the office today for follow up regarding DM and HTN   DM -diagnosed in February 2022.  At this time his A1c was 8.4.  He was placed on metformin 1000 mg twice daily.  He reports that he has been taking his medications but his diet has been poor.  He does not check his blood sugars at home. Lab Results  Component Value Date   HGBA1C 7.5 (A) 07/10/2021   HTN -prescribed Norvasc 5 mg and lisinopril/hydrochlorothiazide 20-25 mg daily.  Denies dizziness, lightheadedness, chest pain, shortness of breath. BP Readings from Last 3 Encounters:  11/16/21 (!) 160/100  07/10/21 122/80  06/25/21 122/86    Review of Systems See HPI   Past Medical History:  Diagnosis Date   ALLERGIC RHINITIS 08/18/2007   Allergy    seasonal   Anxiety    BACK PAIN    herniated disc   Depression    takes Paxil daily   Diabetes mellitus without complication ([place])    Difficult intubation    09/05/10: difficult airway documented (ant cords vis with head up/cricoid press), MAC 4 used   History of kidney stones    HYPERLIPIDEMIA 02/12/2010   HYPERTENSION    takes Amlodipine and Lisinopril-HCTZ daily   IBS (irritable bowel syndrome)    MIGRAINE HEADACHE    takes Imitrex daily as needed   Neuromuscular disorder (HCC)    nerve pain left leg post op back surgery   Nocturia     Social History   Socioeconomic History   Marital status: Married    Spouse name: Not on file   Number of children: 1   Years of education: Not on file   Highest  education level: Not on file  Occupational History   Occupation: pressman helper    Comment: Engineer, structural  Tobacco Use   Smoking status: Former    Packs/day: 3.00    Years: 10.00    Pack years: 30.00    Types: Cigarettes   Smokeless tobacco: Never   Tobacco comments:    quit smoking about 46yr ago  Substance and Sexual Activity   Alcohol use: Yes    Comment: occasionally   Drug use: No   Sexual activity: Not on file  Other Topics Concern   Not on file  Social History Narrative   Not on file   Social Determinants of Health   Financial Resource Strain: Not on file  Food Insecurity: Not on file  Transportation Needs: Not on file  Physical Activity: Not on file  Stress: Not on file  Social Connections: Not on file  Intimate Partner Violence: Not on file    Past Surgical History:  Procedure Laterality Date   COLONOSCOPY WITH PROPOFOL N/A 06/25/2021   Procedure: COLONOSCOPY WITH PROPOFOL;  Surgeon: BThornton Park MD;  Location: [facility];  Service: Gastroenterology;  Laterality: N/A;   CYSTOSCOPY     FINGER SURGERY     POLYPECTOMY  06/25/2021   Procedure: POLYPECTOMY;  Surgeon: BThornton Park MD;  Location: [facility];  Service: Gastroenterology;;   POSTERIOR CERVICAL FUSION/FORAMINOTOMY N/A  11/22/2015   Procedure: TLIF L5-S1;  Surgeon: Melina Schools, MD;  Location: [facility];  Service: Orthopedics;  Laterality: N/A;   SHOULDER SURGERY Right 03/2018   Dr. Veverly Fells    Family History  Problem Relation Age of Onset   High blood pressure Father    Diabetes Father    Arthritis Father    Diabetes Paternal Grandmother    Heart disease Other        No family history   Colon cancer Neg Hx    Esophageal cancer Neg Hx    Colon polyps Neg Hx    Rectal cancer Neg Hx    Stomach cancer Neg Hx     Allergies  Allergen Reactions   Doxycycline Nausea Only   Other     Cough medication "starts with a B made him sick", cannot recall name or exact reaction   Penicillins  Other (See Comments)    Has patient had a PCN reaction causing immediate rash, facial/tongue/throat swelling, SOB or lightheadedness with hypotension: {Yes/No:30480221} Has patient had a PCN reaction causing severe rash involving mucus membranes or skin necrosis: {Yes/No:30480221} Has patient had a PCN reaction that required hospitalization {Yes/No:30480221} Has patient had a PCN reaction occurring within the last 10 years: {Yes/No:30480221} If all of the above answers are "NO", then may proceed with Cephalosporin use.   Tessalon [Benzonatate] Other (See Comments)    Unknown     Current Outpatient Medications on File Prior to Visit  Medication Sig Dispense Refill   albuterol (VENTOLIN HFA) 108 (90 Base) MCG/ACT inhaler Inhale 2 puffs into the lungs every 6 (six) hours as needed for wheezing or shortness of breath. 8 g 0   amitriptyline (ELAVIL) 25 MG tablet TAKE 1 TABLET(25 MG) BY MOUTH AT BEDTIME 30 tablet 3   amLODipine (NORVASC) 5 MG tablet TAKE 1 TABLET(5 MG) BY MOUTH DAILY 30 tablet 11   atorvastatin (LIPITOR) 40 MG tablet Take 1 tablet (40 mg total) by mouth daily. 30 tablet 11   Blood Glucose Monitoring Suppl (ONE TOUCH ULTRA 2) w/Device KIT 1 kit by Does not apply route in the morning, at noon, and at bedtime. 1 kit 0   cetirizine (ZYRTEC) 10 MG tablet Take 10 mg by mouth in the morning.     gabapentin (NEURONTIN) 800 MG tablet Take 800 mg by mouth 2 (two) times daily.  2   glucose blood test strip Use as instructed 300 each 6   HYDROcodone-acetaminophen (NORCO/VICODIN) 5-325 MG tablet Take 1 tablet by mouth in the morning, at noon, and at bedtime.  0   hydrOXYzine (ATARAX) 10 MG tablet TAKE 1 TABLET(10 MG) BY MOUTH THREE TIMES DAILY AS NEEDED 90 tablet 0   ibuprofen (ADVIL) 200 MG tablet Take 800 mg by mouth 2 (two) times daily as needed for moderate pain.     Lancets (ONETOUCH ULTRASOFT) lancets Use as instructed 300 each 6   lisinopril-hydrochlorothiazide (ZESTORETIC) 20-25 MG  tablet Take 1 tablet by mouth daily. 30 tablet 11   magic mouthwash w/lidocaine SOLN Take 5 mLs by mouth 3 (three) times daily as needed. Gargle and spit 180 mL 0   metFORMIN (GLUCOPHAGE) 1000 MG tablet Take 1 tablet (1,000 mg total) by mouth 2 (two) times daily with a meal. 60 tablet 2   PARoxetine (PAXIL) 40 MG tablet Take 1 tablet (40 mg total) by mouth daily. 30 tablet 6   predniSONE (DELTASONE) 10 MG tablet 40 mg x 3 days, 20 mg x 3 days, 10 mg  x 3 days 21 tablet 0   promethazine (PHENERGAN) 25 MG tablet TAKE 1 TABLET BY MOUTH EVERY 8 HOURS AS NEEDED 30 tablet 1   SUMAtriptan (IMITREX) 100 MG tablet Take 1 tablet (100 mg total) by mouth every 2 (two) hours as needed for migraine. May repeat in 2 hours if headache persists or recurs. 15 tablet 3   No current facility-administered medications on file prior to visit.    There were no vitals taken for this visit.      Objective:   Physical Exam Vitals and nursing note reviewed.  Constitutional:      Appearance: Normal appearance.  Cardiovascular:     Rate and Rhythm: Normal rate and regular rhythm.     Pulses: Normal pulses.     Heart sounds: Normal heart sounds.  Pulmonary:     Effort: Pulmonary effort is normal.     Breath sounds: Normal breath sounds.  Skin:    General: Skin is warm and dry.     Capillary Refill: Capillary refill takes less than 2 seconds.  Neurological:     General: No focal deficit present.     Mental Status: He is alert and oriented to person, place, and time.  Psychiatric:        Mood and Affect: Mood normal.        Behavior: Behavior normal.        Thought Content: Thought content normal.        Judgment: Judgment normal.       Assessment & Plan:

## 2021-12-05 ENCOUNTER — Encounter: Payer: Self-pay | Admitting: Adult Health

## 2021-12-05 ENCOUNTER — Ambulatory Visit: Payer: 59 | Admitting: Adult Health

## 2021-12-05 VITALS — BP 121/79 | HR 103 | Temp 98.0°F | Wt 186.4 lb

## 2021-12-05 DIAGNOSIS — I10 Essential (primary) hypertension: Secondary | ICD-10-CM

## 2021-12-05 DIAGNOSIS — E1165 Type 2 diabetes mellitus with hyperglycemia: Secondary | ICD-10-CM | POA: Diagnosis not present

## 2021-12-05 LAB — POCT GLYCOSYLATED HEMOGLOBIN (HGB A1C): Hemoglobin A1C: 7.2 % — AB (ref 4.0–5.6)

## 2021-12-05 MED ORDER — METFORMIN HCL 1000 MG PO TABS: 1000.0000 mg | ORAL_TABLET | Freq: Two times a day (BID) | ORAL | 2 refills | Status: DC

## 2021-12-05 NOTE — Progress Notes (Signed)
Subjective:    Patient ID: Dustin Henry, male    DOB: 1971-11-19, 50 y.o.   MRN: [id number]  HPI 50 year old male who  has a past medical history of ALLERGIC RHINITIS (08/18/2007), Allergy, Anxiety, BACK PAIN, Depression, Diabetes mellitus without complication ([place]), Difficult intubation, History of kidney stones, HYPERLIPIDEMIA (02/12/2010), HYPERTENSION, IBS (irritable bowel syndrome), MIGRAINE HEADACHE, Neuromuscular disorder ([place]), and Nocturia.  He presents to the office today for follow-up regarding diabetes and hypertension  Diabetes mellitus type 2-newly diagnosed in February 2022 at which time his A1c was 8.4.  He was placed on metformin 1000 mg twice daily at this time. He has not been checking his blood sugars on a routine basis. He continues to work on diet but is having a hard time with sodas. He is not exercising much.   Lab Results  Component Value Date   HGBA1C 7.5 (A) 07/10/2021   Wt Readings from Last 3 Encounters:  12/05/21 186 lb 6.4 oz (84.6 kg)  11/16/21 188 lb (85.3 kg)  07/10/21 177 lb (80.3 kg)     HTN -managed with amlodipine 5 mg daily and lisinopril/hydrochlorothiazide 20-25 mg daily.  He denies dizziness, lightheadedness, chest pain, shortness of breath BP Readings from Last 3 Encounters:  12/05/21 121/79  11/16/21 (!) 160/100  07/10/21 122/80    Review of Systems See HPI   Past Medical History:  Diagnosis Date   ALLERGIC RHINITIS 08/18/2007   Allergy    seasonal   Anxiety    BACK PAIN    herniated disc   Depression    takes Paxil daily   Diabetes mellitus without complication ([place])    Difficult intubation    09/05/10: difficult airway documented (ant cords vis with head up/cricoid press), MAC 4 used   History of kidney stones    HYPERLIPIDEMIA 02/12/2010   HYPERTENSION    takes Amlodipine and Lisinopril-HCTZ daily   IBS (irritable bowel syndrome)    MIGRAINE HEADACHE    takes Imitrex daily as needed   Neuromuscular disorder  (HCC)    nerve pain left leg post op back surgery   Nocturia     Social History   Socioeconomic History   Marital status: Married    Spouse name: Not on file   Number of children: 1   Years of education: Not on file   Highest education level: Not on file  Occupational History   Occupation: pressman helper    Comment: Engineer, structural  Tobacco Use   Smoking status: Former    Packs/day: 3.00    Years: 10.00    Pack years: 30.00    Types: Cigarettes   Smokeless tobacco: Never   Tobacco comments:    quit smoking about 15yr ago  Substance and Sexual Activity   Alcohol use: Yes    Comment: occasionally   Drug use: No   Sexual activity: Not on file  Other Topics Concern   Not on file  Social History Narrative   Not on file   Social Determinants of Health   Financial Resource Strain: Not on file  Food Insecurity: Not on file  Transportation Needs: Not on file  Physical Activity: Not on file  Stress: Not on file  Social Connections: Not on file  Intimate Partner Violence: Not on file    Past Surgical History:  Procedure Laterality Date   COLONOSCOPY WITH PROPOFOL N/A 06/25/2021   Procedure: COLONOSCOPY WITH PROPOFOL;  Surgeon: BThornton Park MD;  Location: [facility];  Service:  Gastroenterology;  Laterality: N/A;   CYSTOSCOPY     FINGER SURGERY     POLYPECTOMY  06/25/2021   Procedure: POLYPECTOMY;  Surgeon: Thornton Park, MD;  Location: [facility];  Service: Gastroenterology;;   POSTERIOR CERVICAL FUSION/FORAMINOTOMY N/A 11/22/2015   Procedure: TLIF L5-S1;  Surgeon: Melina Schools, MD;  Location: [facility];  Service: Orthopedics;  Laterality: N/A;   SHOULDER SURGERY Right 03/2018   Dr. Veverly Fells    Family History  Problem Relation Age of Onset   High blood pressure Father    Diabetes Father    Arthritis Father    Diabetes Paternal Grandmother    Heart disease Other        No family history   Colon cancer Neg Hx    Esophageal cancer Neg Hx    Colon polyps  Neg Hx    Rectal cancer Neg Hx    Stomach cancer Neg Hx       Current Outpatient Medications on File Prior to Visit  Medication Sig Dispense Refill   albuterol (VENTOLIN HFA) 108 (90 Base) MCG/ACT inhaler Inhale 2 puffs into the lungs every 6 (six) hours as needed for wheezing or shortness of breath. 8 g 0   amitriptyline (ELAVIL) 25 MG tablet TAKE 1 TABLET(25 MG) BY MOUTH AT BEDTIME 30 tablet 3   amLODipine (NORVASC) 5 MG tablet TAKE 1 TABLET(5 MG) BY MOUTH DAILY 30 tablet 11   atorvastatin (LIPITOR) 40 MG tablet Take 1 tablet (40 mg total) by mouth daily. 30 tablet 11   Blood Glucose Monitoring Suppl (ONE TOUCH ULTRA 2) w/Device KIT 1 kit by Does not apply route in the morning, at noon, and at bedtime. 1 kit 0   cetirizine (ZYRTEC) 10 MG tablet Take 10 mg by mouth in the morning.     gabapentin (NEURONTIN) 800 MG tablet Take 800 mg by mouth 2 (two) times daily.  2   glucose blood test strip Use as instructed 300 each 6   HYDROcodone-acetaminophen (NORCO/VICODIN) 5-325 MG tablet Take 1 tablet by mouth in the morning, at noon, and at bedtime.  0   hydrOXYzine (ATARAX) 10 MG tablet TAKE 1 TABLET(10 MG) BY MOUTH THREE TIMES DAILY AS NEEDED 90 tablet 0   ibuprofen (ADVIL) 200 MG tablet Take 800 mg by mouth 2 (two) times daily as needed for moderate pain.     Lancets (ONETOUCH ULTRASOFT) lancets Use as instructed 300 each 6   lisinopril-hydrochlorothiazide (ZESTORETIC) 20-25 MG tablet Take 1 tablet by mouth daily. 30 tablet 11   magic mouthwash w/lidocaine SOLN Take 5 mLs by mouth 3 (three) times daily as needed. Gargle and spit 180 mL 0   promethazine (PHENERGAN) 25 MG tablet TAKE 1 TABLET BY MOUTH EVERY 8 HOURS AS NEEDED 30 tablet 1   SUMAtriptan (IMITREX) 100 MG tablet Take 1 tablet (100 mg total) by mouth every 2 (two) hours as needed for migraine. May repeat in 2 hours if headache persists or recurs. 15 tablet 3   metFORMIN (GLUCOPHAGE) 1000 MG tablet Take 1 tablet (1,000 mg total) by mouth  2 (two) times daily with a meal. 60 tablet 2   PARoxetine (PAXIL) 40 MG tablet Take 1 tablet (40 mg total) by mouth daily. 30 tablet 6   predniSONE (DELTASONE) 10 MG tablet 40 mg x 3 days, 20 mg x 3 days, 10 mg x 3 days (Patient not taking: Reported on 12/05/2021) 21 tablet 0   No current facility-administered medications on file prior to visit.  BP 121/79 (BP Location: Left Arm, Patient Position: Sitting, Cuff Size: Normal)    Pulse (!) 103    Temp 98 F (36.7 C) (Oral)    Wt 186 lb 6.4 oz (84.6 kg)    BMI 26.75 kg/m       Objective:   Physical Exam Vitals and nursing note reviewed.  Constitutional:      Appearance: Normal appearance. He is obese.  Cardiovascular:     Rate and Rhythm: Normal rate and regular rhythm.     Pulses: Normal pulses.     Heart sounds: Normal heart sounds.  Pulmonary:     Effort: Pulmonary effort is normal.     Breath sounds: Normal breath sounds.  Musculoskeletal:        General: Normal range of motion.  Skin:    General: Skin is warm and dry.  Neurological:     General: No focal deficit present.     Mental Status: He is alert and oriented to person, place, and time.  Psychiatric:        Mood and Affect: Mood normal.        Behavior: Behavior normal.        Thought Content: Thought content normal.      Assessment & Plan:  1. Uncontrolled type 2 diabetes mellitus with hyperglycemia (HCC)  - metFORMIN (GLUCOPHAGE) 1000 MG tablet; Take 1 tablet (1,000 mg total) by mouth 2 (two) times daily with a meal.  Dispense: 60 tablet; Refill: 2 - POC HgB A1c- 7.2 - has improved  - Encouraged aoerobic exercise - If he can cut out sodas his A1 would likely be at goal. Recommened soda stream.   2. Essential hypertension - At goal - No change in medications   Dorothyann Peng, NP

## 2021-12-26 ENCOUNTER — Telehealth: Payer: Self-pay | Admitting: Adult Health

## 2021-12-26 ENCOUNTER — Encounter: Payer: Self-pay | Admitting: Adult Health

## 2021-12-26 ENCOUNTER — Ambulatory Visit (INDEPENDENT_AMBULATORY_CARE_PROVIDER_SITE_OTHER): Payer: 59 | Admitting: Adult Health

## 2021-12-26 VITALS — BP 128/86 | HR 110 | Temp 99.0°F | Ht 70.0 in | Wt 184.0 lb

## 2021-12-26 DIAGNOSIS — J40 Bronchitis, not specified as acute or chronic: Secondary | ICD-10-CM | POA: Diagnosis not present

## 2021-12-26 MED ORDER — AZITHROMYCIN 250 MG PO TABS: ORAL_TABLET | ORAL | 0 refills | Status: AC

## 2021-12-26 NOTE — Telephone Encounter (Signed)
Patient calling in with respiratory symptoms: Shortness of breath, chest pain, palpitations or other red words send to Triage  Does the patient have a fever over 100, cough, congestion, sore throat, runny nose, lost of taste/smell (please list symptoms that patient has)? Cough   What date did symptoms start?11-20-2022 (If over 5 days ago, pt may be scheduled for in person visit)  Have you tested for Covid in the last 5 days? No   If yes, was it positive []  OR negative [] ? If positive in the last 5 days, please schedule virtual visit now. If negative, schedule for an in person OV with the next available provider if PCP has no openings. Please also let patient know they will be tested again (follow the script below)  "you will have to arrive 42mins prior to your appt time to be Covid tested. Please park in back of office at the cone & call [phone] to let the staff know you have arrived. A staff member will meet you at your car to do a rapid covid test. Once the test has resulted you will be notified by phone of your results to determine if appt will remain an in person visit or be converted to a virtual/phone visit. If you arrive less than 48mins before your appt time, your visit will be automatically converted to virtual & any recommended testing will happen AFTER the visit." Pt has an appt with cory 12-26-2021 If no availability for virtual visit in office,  please schedule another [place] office  If no availability at another [place] office, please instruct patient that they can schedule an evisit or virtual visit through their mychart account. Visits up to 8pm  patients can be seen in office 5 days after positive COVID test

## 2021-12-26 NOTE — Progress Notes (Signed)
Subjective:    Patient ID: Dustin Henry, male    DOB: 04/11/1971, 51 y.o.   MRN: [id number]  HPI 51 year old male who  has a past medical history of ALLERGIC RHINITIS (08/18/2007), Allergy, Anxiety, BACK PAIN, Depression, Diabetes mellitus without complication ([place]), Difficult intubation, History of kidney stones, HYPERLIPIDEMIA (02/12/2010), HYPERTENSION, IBS (irritable bowel syndrome), MIGRAINE HEADACHE, Neuromuscular disorder ([place]), and Nocturia.  He presents to the office today for an acute issue. His symptoms have been present for roughly one week. Symptoms include a productive cough with discolored thick mucus. Denies fevers, chills, shortness of breath, or wheezing.   Took an at home covid test which was negative   Has been using Mucinex, sudafed and promethazine cough syrup without improvement.   Has not been using his albuterol inhaler.    Review of Systems See HPI   Past Medical History:  Diagnosis Date   ALLERGIC RHINITIS 08/18/2007   Allergy    seasonal   Anxiety    BACK PAIN    herniated disc   Depression    takes Paxil daily   Diabetes mellitus without complication ([place])    Difficult intubation    09/05/10: difficult airway documented (ant cords vis with head up/cricoid press), MAC 4 used   History of kidney stones    HYPERLIPIDEMIA 02/12/2010   HYPERTENSION    takes Amlodipine and Lisinopril-HCTZ daily   IBS (irritable bowel syndrome)    MIGRAINE HEADACHE    takes Imitrex daily as needed   Neuromuscular disorder ([facility])    nerve pain left leg post op back surgery   Nocturia     Social History   Socioeconomic History   Marital status: Married    Spouse name: Not on file   Number of children: 1   Years of education: Not on file   Highest education level: Not on file  Occupational History   Occupation: pressman helper    Comment: Engineer, structural  Tobacco Use   Smoking status: Former    Packs/day: 3.00    Years: 10.00    Pack years: 30.00     Types: Cigarettes   Smokeless tobacco: Never   Tobacco comments:    quit smoking about 36yr ago  Substance and Sexual Activity   Alcohol use: Yes    Comment: occasionally   Drug use: No   Sexual activity: Not on file  Other Topics Concern   Not on file  Social History Narrative   Not on file   Social Determinants of Health   Financial Resource Strain: Not on file  Food Insecurity: Not on file  Transportation Needs: Not on file  Physical Activity: Not on file  Stress: Not on file  Social Connections: Not on file  Intimate Partner Violence: Not on file    Past Surgical History:  Procedure Laterality Date   COLONOSCOPY WITH PROPOFOL N/A 06/25/2021   Procedure: COLONOSCOPY WITH PROPOFOL;  Surgeon: BThornton Park MD;  Location: [facility];  Service: Gastroenterology;  Laterality: N/A;   CYSTOSCOPY     FINGER SURGERY     POLYPECTOMY  06/25/2021   Procedure: POLYPECTOMY;  Surgeon: BThornton Park MD;  [facility];  Service: Gastroenterology;;   POSTERIOR CERVICAL FUSION/FORAMINOTOMY N/A 11/22/2015   Procedure: TLIF L5-S1;  Surgeon: DMelina Schools MD;  [facility]  Service: Orthopedics;  Laterality: N/A;   SHOULDER SURGERY Right 03/2018   Dr. NVeverly Fells   Family History  Problem Relation Age of Onset   High  blood pressure Father    Diabetes Father    Arthritis Father    Diabetes Paternal Grandmother    Heart disease Other        No family history   Colon cancer Neg Hx    Esophageal cancer Neg Hx    Colon polyps Neg Hx    Rectal cancer Neg Hx    Stomach cancer Neg Hx       Current Outpatient Medications on File Prior to Visit  Medication Sig Dispense Refill   albuterol (VENTOLIN HFA) 108 (90 Base) MCG/ACT inhaler Inhale 2 puffs into the lungs every 6 (six) hours as needed for wheezing or shortness of breath. 8 g 0   amitriptyline (ELAVIL) 25 MG tablet TAKE 1 TABLET(25 MG) BY MOUTH AT BEDTIME 30 tablet 3   amLODipine (NORVASC) 5 MG tablet TAKE 1  TABLET(5 MG) BY MOUTH DAILY 30 tablet 11   atorvastatin (LIPITOR) 40 MG tablet Take 1 tablet (40 mg total) by mouth daily. 30 tablet 11   Blood Glucose Monitoring Suppl (ONE TOUCH ULTRA 2) w/Device KIT 1 kit by Does not apply route in the morning, at noon, and at bedtime. 1 kit 0   cetirizine (ZYRTEC) 10 MG tablet Take 10 mg by mouth in the morning.     diphenhydrAMINE (BENADRYL) 12.5 MG/5ML liquid Take by mouth.     gabapentin (NEURONTIN) 800 MG tablet Take 800 mg by mouth 2 (two) times daily.  2   glucose blood test strip Use as instructed 300 each 6   HYDROcodone-acetaminophen (NORCO/VICODIN) 5-325 MG tablet Take 1 tablet by mouth in the morning, at noon, and at bedtime.  0   hydrOXYzine (ATARAX) 10 MG tablet TAKE 1 TABLET(10 MG) BY MOUTH THREE TIMES DAILY AS NEEDED 90 tablet 0   ibuprofen (ADVIL) 200 MG tablet Take 800 mg by mouth 2 (two) times daily as needed for moderate pain.     Lancets (ONETOUCH ULTRASOFT) lancets Use as instructed 300 each 6   lisinopril-hydrochlorothiazide (ZESTORETIC) 20-25 MG tablet Take 1 tablet by mouth daily. 30 tablet 11   metFORMIN (GLUCOPHAGE) 1000 MG tablet Take 1 tablet (1,000 mg total) by mouth 2 (two) times daily with a meal. 60 tablet 2   promethazine (PHENERGAN) 25 MG tablet TAKE 1 TABLET BY MOUTH EVERY 8 HOURS AS NEEDED 30 tablet 1   SUMAtriptan (IMITREX) 100 MG tablet Take 1 tablet (100 mg total) by mouth every 2 (two) hours as needed for migraine. May repeat in 2 hours if headache persists or recurs. 15 tablet 3   PARoxetine (PAXIL) 40 MG tablet Take 1 tablet (40 mg total) by mouth daily. 30 tablet 6   No current facility-administered medications on file prior to visit.    BP 128/86    Pulse (!) 110    Temp 99 F (37.2 C) (Oral)    Ht _0  (1.778 m)    Wt 184 lb (83.5 kg)    SpO2 96%    BMI 26.40 kg/m       Objective:   Physical Exam Vitals and nursing note reviewed.  Constitutional:      Appearance: Normal appearance.  Cardiovascular:      Rate and Rhythm: Normal rate and regular rhythm.     Pulses: Normal pulses.     Heart sounds: Normal heart sounds.  Pulmonary:     Effort: Pulmonary effort is normal.     Breath sounds: No stridor. Wheezing (expiratory wheezing) present. No rhonchi or rales.  Chest:     Chest wall: No tenderness.  Skin:    General: Skin is warm and dry.  Neurological:     General: No focal deficit present.     Mental Status: He is alert and oriented to person, place, and time.  Psychiatric:        Mood and Affect: Mood normal.        Behavior: Behavior normal.        Thought Content: Thought content normal.        Judgment: Judgment normal.      Assessment & Plan:  1. Bronchitis - Will treat for bronchitis. Advised to start using his albuterol inhaler that he has at home.  - azithromycin (ZITHROMAX) 250 MG tablet; Take 2 tablets on day 1, then 1 tablet daily on days 2 through 5  Dispense: 6 tablet; Refill: 0 - Follow up if not improving in the next 2-3 days   Dorothyann Peng, NP

## 2021-12-27 ENCOUNTER — Other Ambulatory Visit: Payer: Self-pay | Admitting: Adult Health

## 2021-12-27 DIAGNOSIS — R110 Nausea: Secondary | ICD-10-CM

## 2021-12-27 NOTE — Telephone Encounter (Signed)
Please advise 

## 2021-12-28 ENCOUNTER — Other Ambulatory Visit: Payer: Self-pay | Admitting: Adult Health

## 2021-12-28 MED ORDER — PREDNISONE 10 MG PO TABS: 10.0000 mg | ORAL_TABLET | Freq: Every day | ORAL | 0 refills | Status: DC

## 2022-01-07 ENCOUNTER — Other Ambulatory Visit: Payer: Self-pay | Admitting: Adult Health

## 2022-01-07 DIAGNOSIS — F411 Generalized anxiety disorder: Secondary | ICD-10-CM

## 2022-01-20 ENCOUNTER — Other Ambulatory Visit: Payer: Self-pay | Admitting: Adult Health

## 2022-01-20 DIAGNOSIS — E785 Hyperlipidemia, unspecified: Secondary | ICD-10-CM

## 2022-01-20 DIAGNOSIS — I10 Essential (primary) hypertension: Secondary | ICD-10-CM

## 2022-02-05 ENCOUNTER — Ambulatory Visit: Payer: 59 | Admitting: Adult Health

## 2022-02-27 ENCOUNTER — Other Ambulatory Visit: Payer: Self-pay | Admitting: Adult Health

## 2022-02-27 DIAGNOSIS — F411 Generalized anxiety disorder: Secondary | ICD-10-CM

## 2022-02-28 NOTE — Telephone Encounter (Signed)
Attempted to call pt to determine if pt is seeing a therapist. Pt will also need f/u appt with PCP for refills for anxiety medications. ?

## 2022-03-06 ENCOUNTER — Ambulatory Visit: Payer: 59 | Admitting: Adult Health

## 2022-03-06 ENCOUNTER — Encounter: Payer: Self-pay | Admitting: Adult Health

## 2022-03-06 VITALS — BP 130/86 | HR 85 | Temp 98.5°F | Ht 70.0 in | Wt 189.0 lb

## 2022-03-06 DIAGNOSIS — G43009 Migraine without aura, not intractable, without status migrainosus: Secondary | ICD-10-CM

## 2022-03-06 DIAGNOSIS — I10 Essential (primary) hypertension: Secondary | ICD-10-CM | POA: Diagnosis not present

## 2022-03-06 DIAGNOSIS — E1165 Type 2 diabetes mellitus with hyperglycemia: Secondary | ICD-10-CM | POA: Diagnosis not present

## 2022-03-06 LAB — POCT GLYCOSYLATED HEMOGLOBIN (HGB A1C): Hemoglobin A1C: 7.1 % — AB (ref 4.0–5.6)

## 2022-03-06 MED ORDER — METFORMIN HCL 1000 MG PO TABS: 1000.0000 mg | ORAL_TABLET | Freq: Two times a day (BID) | ORAL | 0 refills | Status: DC

## 2022-03-06 MED ORDER — AMLODIPINE BESYLATE 5 MG PO TABS: ORAL_TABLET | ORAL | 1 refills | Status: DC

## 2022-03-06 MED ORDER — AMITRIPTYLINE HCL 25 MG PO TABS: ORAL_TABLET | ORAL | 1 refills | Status: DC

## 2022-03-06 NOTE — Progress Notes (Signed)
? ?Subjective:  ? ? Patient ID: Dustin Henry, male    DOB: Jun 15, 1971, 51 y.o.   MRN: [id number] ? ?HPI ?51 year old male who  has a past medical history of ALLERGIC RHINITIS (08/18/2007), Allergy, Anxiety, BACK PAIN, Depression, Diabetes mellitus without complication ([place]), Difficult intubation, History of kidney stones, HYPERLIPIDEMIA (02/12/2010), HYPERTENSION, IBS (irritable bowel syndrome), MIGRAINE HEADACHE, Neuromuscular disorder ([place]), and Nocturia. ? ?Presents to the office today for follow-up regarding diabetes and hypertension ? ?Diabetes mellitus type 2-newly diagnosed in February 2022 at which time his A1c was 8.4.  He was placed on metformin 1000 mg twice daily. ?His last A1c in December 2022 was 7.2. 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 Diet continues to be an issue. He is not checking his BS at home  ?Wt Readings from Last 10 Encounters:  ?03/06/22 189 lb (85.7 kg)  ?12/26/21 184 lb (83.5 kg)  ?12/05/21 186 lb 6.4 oz (84.6 kg)  ?11/16/21 188 lb (85.3 kg)  ?07/10/21 177 lb (80.3 kg)  ?06/25/21 182 lb (82.6 kg)  ?06/20/21 182 lb (82.6 kg)  ?04/13/21 181 lb 3.2 oz (82.2 kg)  ?02/14/21 187 lb 6 oz (85 kg)  ?01/12/21 183 lb (83 kg)  ? ? ?HTN -managed with amlodipine 5 mg daily and lisinopril/hydrochlorothiazide 20-25 mg daily.  He denies dizziness, lightheadedness, chest pain, or shortness of breath ? ?BP Readings from Last 3 Encounters:  ?03/06/22 130/86  ?12/26/21 128/86  ?12/05/21 121/79  ? ?He also needs some refills on medications to get him till his CPE  ? ?Review of Systems ?See HPI  ? ?Past Medical History:  ?Diagnosis Date  ? ALLERGIC RHINITIS 08/18/2007  ? Allergy   ? seasonal  ? Anxiety   ? BACK PAIN   ? herniated disc  ? Depression   ? takes Paxil daily  ? Diabetes mellitus without complication ([place])   ? Difficult intubation   ? 09/05/10: difficult airway documented (ant cords vis with head up/cricoid press), MAC 4 used  ? History of kidney stones   ? HYPERLIPIDEMIA  02/12/2010  ? HYPERTENSION   ? takes Amlodipine and Lisinopril-HCTZ daily  ? IBS (irritable bowel syndrome)   ? MIGRAINE HEADACHE   ? takes Imitrex daily as needed  ? Neuromuscular disorder ([place])   ? nerve pain left leg post op back surgery  ? Nocturia   ? ? ?Social History  ? ?Socioeconomic History  ? Marital status: Married  ?  Spouse name: Not on file  ? Number of children: 1  ? Years of education: Not on file  ? Highest education level: Not on file  ?Occupational History  ? 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 Smoking status: Former  ?  Packs/day: 3.00  ?  Years: 10.00  ?  Pack years: 30.00  ?  Types: Cigarettes  ? Smokeless tobacco: Never  ? Tobacco comments:  ?  quit smoking about 48yr ago  ?Substance and Sexual Activity  ? Alcohol use: Yes  ?  Comment: occasionally  ? Drug use: No  ? Sexual activity: Not on file  ?Other Topics Concern  ? Not on file  ?Social History Narrative  ? Not on file  ? ?Social Determinants of Health  ? ?Financial Resource Strain: Not on file  ?Food Insecurity: Not on file  ?Transportation Needs: Not on file  ?Physical Activity: Not on file  ?Stress: Not on file  ?Social Connections:  Not on file  ?Intimate Partner Violence: Not on file  ? ? ?Past Surgical History:  ?Procedure Laterality Date  ? COLONOSCOPY WITH PROPOFOL N/A 06/25/2021  ? Procedure: COLONOSCOPY WITH PROPOFOL;  Surgeon: Thornton Park, MD;  [facility]: [facility];  Service: Gastroenterology;  Laterality: N/A;  ? CYSTOSCOPY    ? FINGER SURGERY    ? POLYPECTOMY  06/25/2021  ? Procedure: POLYPECTOMY;  Surgeon: Thornton Park, MD;  [facility];  Service: Gastroenterology;;  ? POSTERIOR CERVICAL FUSION/FORAMINOTOMY N/A 11/22/2015  ? Procedure: TLIF L5-S1;  Surgeon: Melina Schools, MD;  Location: [facility];  Service: Orthopedics;  Laterality: N/A;  ? SHOULDER SURGERY Right 03/2018  ? Dr. Veverly Fells  ? ? ?Family History  ?Problem Relation Age of Onset  ? High blood pressure Father   ?  Diabetes Father   ? Arthritis Father   ? Diabetes Paternal Grandmother   ? Heart disease Other   ?     No family history  ? Colon cancer Neg Hx   ? Esophageal cancer Neg Hx   ? Colon polyps Neg Hx   ? Rectal cancer Neg Hx   ? Stomach cancer Neg Hx   ? ? ? ? ?Current Outpatient Medications on File Prior to Visit  ?Medication Sig Dispense Refill  ? amitriptyline (ELAVIL) 25 MG tablet TAKE 1 TABLET(25 MG) BY MOUTH AT BEDTIME 30 tablet 3  ? amLODipine (NORVASC) 5 MG tablet TAKE 1 TABLET(5 MG) BY MOUTH DAILY 30 tablet 11  ? atorvastatin (LIPITOR) 40 MG tablet TAKE 1 TABLET(40 MG) BY MOUTH DAILY 30 tablet 11  ? cetirizine (ZYRTEC) 10 MG tablet Take 10 mg by mouth in the morning.    ? gabapentin (NEURONTIN) 800 MG tablet Take 800 mg by mouth 2 (two) times daily.  2  ? HYDROcodone-acetaminophen (NORCO/VICODIN) 5-325 MG tablet Take 1 tablet by mouth in the morning, at noon, and at bedtime.  0  ? hydrOXYzine (ATARAX) 10 MG tablet TAKE 1 TABLET(10 MG) BY MOUTH THREE TIMES DAILY AS NEEDED 90 tablet 0  ? ibuprofen (ADVIL) 200 MG tablet Take 800 mg by mouth 2 (two) times daily as needed for moderate pain.    ? Lancets (ONETOUCH ULTRASOFT) lancets Use as instructed 300 each 6  ? lisinopril-hydrochlorothiazide (ZESTORETIC) 20-25 MG tablet TAKE 1 TABLET BY MOUTH DAILY 30 tablet 11  ? PARoxetine (PAXIL) 40 MG tablet TAKE 1 TABLET(40 MG) BY MOUTH DAILY 90 tablet 0  ? SUMAtriptan (IMITREX) 100 MG tablet Take 1 tablet (100 mg total) by mouth every 2 (two) hours as needed for migraine. May repeat in 2 hours if headache persists or recurs. 15 tablet 3  ? albuterol (VENTOLIN HFA) 108 (90 Base) MCG/ACT inhaler Inhale 2 puffs into the lungs every 6 (six) hours as needed for wheezing or shortness of breath. (Patient not taking: Reported on 03/06/2022) 8 g 0  ? Blood Glucose Monitoring Suppl (ONE TOUCH ULTRA 2) w/Device KIT 1 kit by Does not apply route in the morning, at noon, and at bedtime. (Patient not taking: Reported on 03/06/2022) 1 kit 0   ? diphenhydrAMINE (BENADRYL) 12.5 MG/5ML liquid Take by mouth. (Patient not taking: Reported on 03/06/2022)    ? glucose blood test strip Use as instructed (Patient not taking: Reported on 03/06/2022) 300 each 6  ? metFORMIN (GLUCOPHAGE) 1000 MG tablet Take 1 tablet (1,000 mg total) by mouth 2 (two) times daily with a meal. 60 tablet 2  ? ?No current facility-administered medications on file prior to visit.  ? ? ?  BP 130/86   Pulse 85   Temp 98.5 ?F (36.9 ?C) (Oral)   Ht _0  (1.778 m)   Wt 189 lb (85.7 kg)   SpO2 99%   BMI 27.12 kg/m?  ? ? ?   ?Objective:  ? Physical Exam ?Vitals and nursing note reviewed.  ?Constitutional:   ?   Appearance: Normal appearance.  ?Cardiovascular:  ?   Rate and Rhythm: Normal rate and regular rhythm.  ?   Pulses: Normal pulses.  ?   Heart sounds: Normal heart sounds.  ?Pulmonary:  ?   Effort: Pulmonary effort is normal.  ?   Breath sounds: Normal breath sounds.  ?Musculoskeletal:     ?   General: Normal range of motion.  ?Skin: ?   General: Skin is warm and dry.  ?Neurological:  ?   General: No focal deficit present.  ?   Mental Status: He is alert and oriented to person, place, and time.  ?Psychiatric:     ?   Mood and Affect: Mood normal.     ?   Behavior: Behavior normal.     ?   Thought Content: Thought content normal.  ? ?   ?Assessment & Plan:  ?1. Uncontrolled type 2 diabetes mellitus with hyperglycemia ([place]) ? ?- POC HgB A1c- 7.1 - improved marginally  ?- Continue with Metformin 1000 mg BID.  ?- Follow up in 3 months. Start monitoring BS at home. Work on low carb/low sugar diet. Exercise more  ?- metFORMIN (GLUCOPHAGE) 1000 MG tablet; Take 1 tablet (1,000 mg total) by mouth 2 (two) times daily with a meal.  Dispense: 180 tablet; Refill: 0 ? ?2. Essential hypertension ? ?- amLODipine (NORVASC) 5 MG tablet; TAKE 1 TABLET(5 MG) BY MOUTH DAILY  Dispense: 90 tablet; Refill: 1 ? ?3. Migraine without aura and without status migrainosus, not intractable ? ?- amitriptyline  (ELAVIL) 25 MG tablet; TAKE 1 TABLET(25 MG) BY MOUTH AT BEDTIME  Dispense: 90 tablet; Refill: 1 ? ?Dorothyann Peng, NP ? ?

## 2022-03-06 NOTE — Patient Instructions (Signed)
Health Maintenance Due  ?Topic Date Due  ? HIV Screening  Never done  ? ? ? ? Row Labels 12/05/2021  ?  7:29 AM 01/12/2021  ?  8:28 AM 08/26/2018  ?  8:12 AM  ?Depression screen PHQ 2/9   Section Header. No data exists in this row.     ?Decreased Interest   1 0 0  ?Down, Depressed, Hopeless   0 2 0  ?PHQ - 2 Score   1 2 0  ?Altered sleeping   2 2   ?Tired, decreased energy   0 2   ?Change in appetite   0 0   ?Feeling bad or failure about yourself    0 0   ?Trouble concentrating   0 2   ?Moving slowly or fidgety/restless   1 1   ?Suicidal thoughts   0 0   ?PHQ-9 Score   4 9   ?Difficult doing work/chores   Not difficult at all Somewhat difficult   ? ? ?

## 2022-03-08 ENCOUNTER — Encounter: Payer: Self-pay | Admitting: Adult Health

## 2022-03-08 ENCOUNTER — Ambulatory Visit: Payer: 59 | Admitting: Adult Health

## 2022-03-08 VITALS — BP 128/68 | HR 80 | Temp 97.9°F | Wt 188.0 lb

## 2022-03-08 DIAGNOSIS — L723 Sebaceous cyst: Secondary | ICD-10-CM | POA: Diagnosis not present

## 2022-03-08 MED ORDER — CEPHALEXIN 500 MG PO CAPS
500.0000 mg | ORAL_CAPSULE | Freq: Three times a day (TID) | ORAL | 0 refills | Status: AC
Start: 2022-03-08 — End: 2022-03-15

## 2022-03-08 NOTE — Progress Notes (Signed)
? ?Subjective:  ? ? Patient ID: Dustin Henry, male    DOB: [birth year], 51 y.o.   MRN: [id number] ? ?HPI ?51 year old male who  has a past medical history of ALLERGIC RHINITIS (08/18/2007), Allergy, Anxiety, BACK PAIN, Depression, Diabetes mellitus without complication ([place]), Difficult intubation, History of kidney stones, HYPERLIPIDEMIA (02/12/2010), HYPERTENSION, IBS (irritable bowel syndrome), MIGRAINE HEADACHE, Neuromuscular disorder ([place]), and Nocturia. ? ?He presents to the office today for incision and drainage of large sebaceous cyst on his right upper neck.  This has been present for roughly 1 year and has been growing larger in size as time goes on.  Over the last few weeks he started to develop more pain.  Has not had any drainage, redness, warmth ? ? ?Review of Systems ?See HPI  ? ?Past Medical History:  ?Diagnosis Date  ? ALLERGIC RHINITIS 08/18/2007  ? Allergy   ? seasonal  ? Anxiety   ? BACK PAIN   ? herniated disc  ? Depression   ? takes Paxil daily  ? Diabetes mellitus without complication ([place])   ? Difficult intubation   ? 09/05/10: difficult airway documented (ant cords vis with head up/cricoid press), MAC 4 used  ? History of kidney stones   ? HYPERLIPIDEMIA 02/12/2010  ? HYPERTENSION   ? takes Amlodipine and Lisinopril-HCTZ daily  ? IBS (irritable bowel syndrome)   ? MIGRAINE HEADACHE   ? takes Imitrex daily as needed  ? Neuromuscular disorder ([place])   ? nerve pain left leg post op back surgery  ? Nocturia   ? ? ?Social History  ? ?Socioeconomic History  ? Marital status: Married  ?  Spouse name: Not on file  ? Number of children: 1  ? Years of education: Not on file  ? Highest education level: Not on file  ?Occupational History  ? 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 Smoking status: Former  ?  Packs/day: 3.00  ?  Years: 10.00  ?  Pack years: 30.00  ?  Types: Cigarettes  ? Smokeless tobacco: Never  ? Tobacco comments:  ?  quit smoking about 49yr ago   ?Substance and Sexual Activity  ? Alcohol use: Yes  ?  Comment: occasionally  ? Drug use: No  ? Sexual activity: Not on file  ?Other Topics Concern  ? Not on file  ?Social History Narrative  ? Not on file  ? ?Social Determinants of Health  ? ?Financial Resource Strain: Not on file  ?Food Insecurity: Not on file  ?Transportation Needs: Not on file  ?Physical Activity: Not on file  ?Stress: Not on file  ?Social Connections: Not on file  ?Intimate Partner Violence: Not on file  ? ? ?Past Surgical History:  ?Procedure Laterality Date  ? COLONOSCOPY WITH PROPOFOL N/A 06/25/2021  ? Procedure: COLONOSCOPY WITH PROPOFOL;  Surgeon: BThornton Park MD;  [facility];  Service: Gastroenterology;  Laterality: N/A;  ? CYSTOSCOPY    ? FINGER SURGERY    ? POLYPECTOMY  06/25/2021  ? Procedure: POLYPECTOMY;  Surgeon: BThornton Park MD;  [facility];  Service: Gastroenterology;;  ? POSTERIOR CERVICAL FUSION/FORAMINOTOMY N/A 11/22/2015  ? Procedure: TLIF L5-S1;  Surgeon: DMelina Schools MD;  [facility]  Service: Orthopedics;  Laterality: N/A;  ? SHOULDER SURGERY Right 03/2018  ? Dr. NVeverly Fells ? ? ?Family History  ?Problem Relation Age of Onset  ? High blood pressure Father   ? Diabetes Father   ?  Arthritis Father   ? Diabetes Paternal Grandmother   ? Heart disease Other   ?     No family history  ? Colon cancer Neg Hx   ? Esophageal cancer Neg Hx   ? Colon polyps Neg Hx   ? Rectal cancer Neg Hx   ? Stomach cancer Neg Hx   ? ? ? ? ?Current Outpatient Medications on File Prior to Visit  ?Medication Sig Dispense Refill  ? albuterol (VENTOLIN HFA) 108 (90 Base) MCG/ACT inhaler Inhale 2 puffs into the lungs every 6 (six) hours as needed for wheezing or shortness of breath. (Patient not taking: Reported on 03/06/2022) 8 g 0  ? amitriptyline (ELAVIL) 25 MG tablet TAKE 1 TABLET(25 MG) BY MOUTH AT BEDTIME 90 tablet 1  ? amLODipine (NORVASC) 5 MG tablet TAKE 1 TABLET(5 MG) BY MOUTH DAILY 90 tablet 1  ? atorvastatin  (LIPITOR) 40 MG tablet TAKE 1 TABLET(40 MG) BY MOUTH DAILY 30 tablet 11  ? Blood Glucose Monitoring Suppl (ONE TOUCH ULTRA 2) w/Device KIT 1 kit by Does not apply route in the morning, at noon, and at bedtime. (Patient not taking: Reported on 03/06/2022) 1 kit 0  ? cetirizine (ZYRTEC) 10 MG tablet Take 10 mg by mouth in the morning.    ? diphenhydrAMINE (BENADRYL) 12.5 MG/5ML liquid Take by mouth. (Patient not taking: Reported on 03/06/2022)    ? gabapentin (NEURONTIN) 800 MG tablet Take 800 mg by mouth 2 (two) times daily.  2  ? glucose blood test strip Use as instructed (Patient not taking: Reported on 03/06/2022) 300 each 6  ? HYDROcodone-acetaminophen (NORCO/VICODIN) 5-325 MG tablet Take 1 tablet by mouth in the morning, at noon, and at bedtime.  0  ? hydrOXYzine (ATARAX) 10 MG tablet TAKE 1 TABLET(10 MG) BY MOUTH THREE TIMES DAILY AS NEEDED 90 tablet 0  ? ibuprofen (ADVIL) 200 MG tablet Take 800 mg by mouth 2 (two) times daily as needed for moderate pain.    ? Lancets (ONETOUCH ULTRASOFT) lancets Use as instructed 300 each 6  ? lisinopril-hydrochlorothiazide (ZESTORETIC) 20-25 MG tablet TAKE 1 TABLET BY MOUTH DAILY 30 tablet 11  ? metFORMIN (GLUCOPHAGE) 1000 MG tablet Take 1 tablet (1,000 mg total) by mouth 2 (two) times daily with a meal. 180 tablet 0  ? PARoxetine (PAXIL) 40 MG tablet TAKE 1 TABLET(40 MG) BY MOUTH DAILY 90 tablet 0  ? SUMAtriptan (IMITREX) 100 MG tablet Take 1 tablet (100 mg total) by mouth every 2 (two) hours as needed for migraine. May repeat in 2 hours if headache persists or recurs. 15 tablet 3  ? ?No current facility-administered medications on file prior to visit.  ? ? ?BP 128/68   Pulse 80   Temp 97.9 ?F (36.6 ?C)   Wt 188 lb (85.3 kg)   SpO2 95%   BMI 26.98 kg/m?  ? ? ?   ?Objective:  ? Physical Exam ?Vitals and nursing note reviewed.  ?Skin: ?   General: Skin is warm and dry.  ?   Capillary Refill: Capillary refill takes less than 2 seconds.  ? ?    ?Neurological:  ?   General: No  focal deficit present.  ?   Mental Status: He is oriented to person, place, and time.  ?Psychiatric:     ?   Mood and Affect: Mood normal.     ?   Behavior: Behavior normal.     ?   Thought Content: Thought content normal.  ? ?   ?  Assessment & Plan:  ? ?1. Sebaceous cyst ?Procedure:  Incision and drainage of abscess ?Risks, benefits, and alternatives explained and consent obtained. ?Time out conducted. ?Surface cleaned with alcohol and betadine ? 1.5cc lidocaine with epinephine infiltrated around cyst ?Adequate anesthesia ensured. ?Area prepped and draped in a sterile fashion. ?#11 blade used to make a slice incision into abscess. ?Sebaceous material expressed with pressure. ?Curved hemostat used to explore 4 quadrants and loculations broken up. ?Further purulence expressed. ?Three 4-0 Ethilon sutures placed ?Hemostasis achieved. ?Pt stable. ?Aftercare and follow-up advised. ? ?- cephALEXin (KEFLEX) 500 MG capsule; Take 1 capsule (500 mg total) by mouth 3 (three) times daily for 7 days.  Dispense: 21 capsule; Refill: 0 ? ?Dorothyann Peng, NP ? ?

## 2022-03-13 ENCOUNTER — Encounter: Payer: Self-pay | Admitting: Adult Health

## 2022-03-13 ENCOUNTER — Ambulatory Visit: Payer: 59 | Admitting: Adult Health

## 2022-03-13 VITALS — BP 110/70 | HR 73 | Temp 98.5°F | Ht 70.0 in | Wt 188.0 lb

## 2022-03-13 DIAGNOSIS — L723 Sebaceous cyst: Secondary | ICD-10-CM

## 2022-03-13 DIAGNOSIS — Z4802 Encounter for removal of sutures: Secondary | ICD-10-CM

## 2022-03-13 NOTE — Progress Notes (Signed)
? ?Subjective:  ? ? Patient ID: Dustin Henry, male    DOB: 27-Apr-1971, 51 y.o.   MRN: [id number] ? ?HPI ?51 year old male who Zentz to the office today for follow-up regarding suture removal.  Roughly 6 days ago he had an incision and drainage of a sebaceous cyst on his right upper neck.  We closed the wound with 3 simple sutures.  Denies any symptoms of infection ? ?He has two days of antibiotics left.  ? ? ?Review of Systems ?See HPI  ? ?Past Medical History:  ?Diagnosis Date  ? ALLERGIC RHINITIS 08/18/2007  ? Allergy   ? seasonal  ? Anxiety   ? BACK PAIN   ? herniated disc  ? Depression   ? takes Paxil daily  ? Diabetes mellitus without complication ([place])   ? Difficult intubation   ? 09/05/10: difficult airway documented (ant cords vis with head up/cricoid press), MAC 4 used  ? History of kidney stones   ? HYPERLIPIDEMIA 02/12/2010  ? HYPERTENSION   ? takes Amlodipine and Lisinopril-HCTZ daily  ? IBS (irritable bowel syndrome)   ? MIGRAINE HEADACHE   ? takes Imitrex daily as needed  ? Neuromuscular disorder ([place])   ? nerve pain left leg post op back surgery  ? Nocturia   ? ? ?Social History  ? ?Socioeconomic History  ? Marital status: Married  ?  Spouse name: Not on file  ? Number of children: 1  ? Years of education: Not on file  ? Highest education level: Not on file  ?Occupational History  ? 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 Smoking status: Former  ?  Packs/day: 3.00  ?  Years: 10.00  ?  Pack years: 30.00  ?  Types: Cigarettes  ? Smokeless tobacco: Never  ? Tobacco comments:  ?  quit smoking about 45yr ago  ?Substance and Sexual Activity  ? Alcohol use: Yes  ?  Comment: occasionally  ? Drug use: No  ? Sexual activity: Not on file  ?Other Topics Concern  ? Not on file  ?Social History Narrative  ? Not on file  ? ?Social Determinants of Health  ? ?Financial Resource Strain: Not on file  ?Food Insecurity: Not on file  ?Transportation Needs: Not on file  ?Physical  Activity: Not on file  ?Stress: Not on file  ?Social Connections: Not on file  ?Intimate Partner Violence: Not on file  ? ? ?Past Surgical History:  ?Procedure Laterality Date  ? COLONOSCOPY WITH PROPOFOL N/A 06/25/2021  ? Procedure: COLONOSCOPY WITH PROPOFOL;  Surgeon: BThornton Park MD;  [facility];  Service: Gastroenterology;  Laterality: N/A;  ? CYSTOSCOPY    ? FINGER SURGERY    ? POLYPECTOMY  06/25/2021  ? Procedure: POLYPECTOMY;  Surgeon: BThornton Park MD;  [facility];  Service: Gastroenterology;;  ? POSTERIOR CERVICAL FUSION/FORAMINOTOMY N/A 11/22/2015  ? Procedure: TLIF L5-S1;  Surgeon: DMelina Schools MD;  [facility]  Service: Orthopedics;  Laterality: N/A;  ? SHOULDER SURGERY Right 03/2018  ? Dr. NVeverly Fells ? ? ?Family History  ?Problem Relation Age of Onset  ? High blood pressure Father   ? Diabetes Father   ? Arthritis Father   ? Diabetes Paternal Grandmother   ? Heart disease Other   ?     No family history  ? Colon cancer Neg Hx   ? Esophageal cancer Neg Hx   ? Colon polyps Neg Hx   ? Rectal  cancer Neg Hx   ? Stomach cancer Neg Hx   ? ? ? ? ?Current Outpatient Medications on File Prior to Visit  ?Medication Sig Dispense Refill  ? albuterol (VENTOLIN HFA) 108 (90 Base) MCG/ACT inhaler Inhale 2 puffs into the lungs every 6 (six) hours as needed for wheezing or shortness of breath. 8 g 0  ? amitriptyline (ELAVIL) 25 MG tablet TAKE 1 TABLET(25 MG) BY MOUTH AT BEDTIME 90 tablet 1  ? amLODipine (NORVASC) 5 MG tablet TAKE 1 TABLET(5 MG) BY MOUTH DAILY 90 tablet 1  ? atorvastatin (LIPITOR) 40 MG tablet TAKE 1 TABLET(40 MG) BY MOUTH DAILY 30 tablet 11  ? Blood Glucose Monitoring Suppl (ONE TOUCH ULTRA 2) w/Device KIT 1 kit by Does not apply route in the morning, at noon, and at bedtime. 1 kit 0  ? cephALEXin (KEFLEX) 500 MG capsule Take 1 capsule (500 mg total) by mouth 3 (three) times daily for 7 days. 21 capsule 0  ? cetirizine (ZYRTEC) 10 MG tablet Take 10 mg by mouth in the  morning.    ? diphenhydrAMINE (BENADRYL) 12.5 MG/5ML liquid Take by mouth.    ? gabapentin (NEURONTIN) 800 MG tablet Take 800 mg by mouth 2 (two) times daily.  2  ? glucose blood test strip Use as instructed 300 each 6  ? HYDROcodone-acetaminophen (NORCO/VICODIN) 5-325 MG tablet Take 1 tablet by mouth in the morning, at noon, and at bedtime.  0  ? hydrOXYzine (ATARAX) 10 MG tablet TAKE 1 TABLET(10 MG) BY MOUTH THREE TIMES DAILY AS NEEDED 90 tablet 0  ? ibuprofen (ADVIL) 200 MG tablet Take 800 mg by mouth 2 (two) times daily as needed for moderate pain.    ? Lancets (ONETOUCH ULTRASOFT) lancets Use as instructed 300 each 6  ? lisinopril-hydrochlorothiazide (ZESTORETIC) 20-25 MG tablet TAKE 1 TABLET BY MOUTH DAILY 30 tablet 11  ? metFORMIN (GLUCOPHAGE) 1000 MG tablet Take 1 tablet (1,000 mg total) by mouth 2 (two) times daily with a meal. 180 tablet 0  ? PARoxetine (PAXIL) 40 MG tablet TAKE 1 TABLET(40 MG) BY MOUTH DAILY 90 tablet 0  ? SUMAtriptan (IMITREX) 100 MG tablet Take 1 tablet (100 mg total) by mouth every 2 (two) hours as needed for migraine. May repeat in 2 hours if headache persists or recurs. 15 tablet 3  ? ?No current facility-administered medications on file prior to visit.  ? ? ?BP 110/70   Pulse 73   Temp 98.5 ?F (36.9 ?C) (Oral)   Ht _0  (1.778 m)   Wt 188 lb (85.3 kg)   SpO2 97%   BMI 26.98 kg/m?  ? ? ?   ?Objective:  ? Physical Exam ?Vitals and nursing note reviewed.  ?Constitutional:   ?   Appearance: Normal appearance.  ?Skin: ?   General: Skin is warm and dry.  ?   Comments: Wound appears well healed with no signs of infection.   ?Neurological:  ?   General: No focal deficit present.  ?   Mental Status: He is alert and oriented to person, place, and time.  ?Psychiatric:     ?   Mood and Affect: Mood normal.     ?   Behavior: Behavior normal.     ?   Thought Content: Thought content normal.     ?   Judgment: Judgment normal.  ? ?   ?Assessment & Plan:  ?1. Visit for suture removal ?-  three sutures removed. Patient tolerated procedure well.  ? ?  2. Sebaceous cyst ?- Finish antibiotic therapy.  ?- Follow up as needed ? ?Dorothyann Peng, NP ? ?

## 2022-04-23 LAB — HM DIABETES EYE EXAM

## 2022-04-28 ENCOUNTER — Other Ambulatory Visit: Payer: Self-pay | Admitting: Adult Health

## 2022-04-28 DIAGNOSIS — F411 Generalized anxiety disorder: Secondary | ICD-10-CM

## 2022-05-07 ENCOUNTER — Encounter: Payer: Self-pay | Admitting: Adult Health

## 2022-05-09 ENCOUNTER — Other Ambulatory Visit: Payer: Self-pay | Admitting: Adult Health

## 2022-05-09 DIAGNOSIS — R110 Nausea: Secondary | ICD-10-CM

## 2022-05-09 DIAGNOSIS — I10 Essential (primary) hypertension: Secondary | ICD-10-CM

## 2022-05-10 ENCOUNTER — Other Ambulatory Visit: Payer: Self-pay | Admitting: Adult Health

## 2022-05-10 DIAGNOSIS — I10 Essential (primary) hypertension: Secondary | ICD-10-CM

## 2022-05-11 ENCOUNTER — Other Ambulatory Visit: Payer: Self-pay | Admitting: Adult Health

## 2022-05-11 DIAGNOSIS — R110 Nausea: Secondary | ICD-10-CM

## 2022-05-13 NOTE — Telephone Encounter (Signed)
Last OV 03/13/22

## 2022-06-01 ENCOUNTER — Other Ambulatory Visit: Payer: Self-pay | Admitting: Adult Health

## 2022-06-01 DIAGNOSIS — E1165 Type 2 diabetes mellitus with hyperglycemia: Secondary | ICD-10-CM

## 2022-06-01 DIAGNOSIS — F411 Generalized anxiety disorder: Secondary | ICD-10-CM

## 2022-06-12 ENCOUNTER — Ambulatory Visit: Payer: 59 | Admitting: Adult Health

## 2022-06-27 ENCOUNTER — Encounter: Payer: Self-pay | Admitting: Adult Health

## 2022-06-27 ENCOUNTER — Ambulatory Visit: Payer: 59 | Admitting: Adult Health

## 2022-06-27 VITALS — BP 112/80 | HR 80 | Temp 97.9°F | Ht 70.0 in | Wt 186.6 lb

## 2022-06-27 DIAGNOSIS — I10 Essential (primary) hypertension: Secondary | ICD-10-CM

## 2022-06-27 DIAGNOSIS — L739 Follicular disorder, unspecified: Secondary | ICD-10-CM

## 2022-06-27 DIAGNOSIS — E118 Type 2 diabetes mellitus with unspecified complications: Secondary | ICD-10-CM

## 2022-06-27 LAB — POCT GLYCOSYLATED HEMOGLOBIN (HGB A1C): Hemoglobin A1C: 6.4 % — AB (ref 4.0–5.6)

## 2022-06-27 MED ORDER — HYDROXYZINE HCL 50 MG PO TABS: 50.0000 mg | ORAL_TABLET | Freq: Three times a day (TID) | ORAL | 0 refills | Status: DC | PRN

## 2022-06-27 MED ORDER — SULFAMETHOXAZOLE-TRIMETHOPRIM 800-160 MG PO TABS: 1.0000 | ORAL_TABLET | Freq: Two times a day (BID) | ORAL | 0 refills | Status: AC

## 2022-06-27 NOTE — Progress Notes (Signed)
Subjective:    Patient ID: Dustin Henry, male    DOB: 27-Dec-1970, 51 y.o.   MRN: [id number]  HPI  51 year old male who  has a past medical history of ALLERGIC RHINITIS (08/18/2007), Allergy, Anxiety, BACK PAIN, Depression, Diabetes mellitus without complication ([place]), Difficult intubation, History of kidney stones, HYPERLIPIDEMIA (02/12/2010), HYPERTENSION, IBS (irritable bowel syndrome), MIGRAINE HEADACHE, Neuromuscular disorder ([place]), and Nocturia.  DM type 2 -diagnosed in February 2022 at which time his A1c was 8.4.  Currently managed with metformin 1000 mg twice daily. His last A1c in March 2023 was 7.1. He reports that he has been taking his medications as directed. Still has not started using [place] system though. He is eating healthier and drinking more water.   Wt Readings from Last 10 Encounters:  06/27/22 186 lb 9.6 oz (84.6 kg)  03/13/22 188 lb (85.3 kg)  03/08/22 188 lb (85.3 kg)  03/06/22 189 lb (85.7 kg)  12/26/21 184 lb (83.5 kg)  12/05/21 186 lb 6.4 oz (84.6 kg)  11/16/21 188 lb (85.3 kg)  07/10/21 177 lb (80.3 kg)  06/25/21 182 lb (82.6 kg)  06/20/21 182 lb (82.6 kg)    HTN -managed with amlodipine 5 mg daily and lisinopril/hydrochlorothiazide 20-25 mg daily.  He denies dizziness, lightheadedness, chest pain, or shortness of breath BP Readings from Last 3 Encounters:  06/27/22 112/80  03/13/22 110/70  03/08/22 128/68   Acutely, he has a red itchy rash on his back that has been present for two weeks. At home he has been using Atarax 10 mg, Benadryl, and Serna cream without relief.   Review of Systems See HPI   Past Medical History:  Diagnosis Date   ALLERGIC RHINITIS 08/18/2007   Allergy    seasonal   Anxiety    BACK PAIN    herniated disc   Depression    takes Paxil daily   Diabetes mellitus without complication ([place])    Difficult intubation    09/05/10: difficult airway documented (ant cords vis with head up/cricoid press), MAC 4 used   History  of kidney stones    HYPERLIPIDEMIA 02/12/2010   HYPERTENSION    takes Amlodipine and Lisinopril-HCTZ daily   IBS (irritable bowel syndrome)    MIGRAINE HEADACHE    takes Imitrex daily as needed   Neuromuscular disorder (HCC)    nerve pain left leg post op back surgery   Nocturia     Social History   Socioeconomic History   Marital status: Married    Spouse name: Not on file   Number of children: 1   Years of education: Not on file   Highest education level: Not on file  Occupational History   Occupation: pressman helper    Comment: Engineer, structural  Tobacco Use   Smoking status: Former    Packs/day: 3.00    Years: 10.00    Total pack years: 30.00    Types: Cigarettes   Smokeless tobacco: Never   Tobacco comments:    quit smoking about 8yr ago  Substance and Sexual Activity   Alcohol use: Yes    Comment: occasionally   Drug use: No   Sexual activity: Not on file  Other Topics Concern   Not on file  Social History Narrative   Not on file   Social Determinants of Health   Financial Resource Strain: Not on file  Food Insecurity: Not on file  Transportation Needs: Not on file  Physical Activity: Not on file  Stress:  Not on file  Social Connections: Not on file  Intimate Partner Violence: Not on file    Past Surgical History:  Procedure Laterality Date   COLONOSCOPY WITH PROPOFOL N/A 06/25/2021   Procedure: COLONOSCOPY WITH PROPOFOL;  Surgeon: Thornton Park, MD;  [facility];  Service: Gastroenterology;  Laterality: N/A;   CYSTOSCOPY     FINGER SURGERY     POLYPECTOMY  06/25/2021   Procedure: POLYPECTOMY;  Surgeon: Thornton Park, MD;  [facility];  Service: Gastroenterology;;   POSTERIOR CERVICAL FUSION/FORAMINOTOMY N/A 11/22/2015   Procedure: TLIF L5-S1;  Surgeon: Melina Schools, MD;  Location: [facility];  Service: Orthopedics;  Laterality: N/A;   SHOULDER SURGERY Right 03/2018   Dr. Veverly Fells    Family History  Problem Relation Age of  Onset   High blood pressure Father    Diabetes Father    Arthritis Father    Diabetes Paternal Grandmother    Heart disease Other        No family history   Colon cancer Neg Hx    Esophageal cancer Neg Hx    Colon polyps Neg Hx    Rectal cancer Neg Hx    Stomach cancer Neg Hx       Current Outpatient Medications on File Prior to Visit  Medication Sig Dispense Refill   amitriptyline (ELAVIL) 25 MG tablet TAKE 1 TABLET(25 MG) BY MOUTH AT BEDTIME 90 tablet 1   amLODipine (NORVASC) 5 MG tablet TAKE 1 TABLET(5 MG) BY MOUTH DAILY 30 tablet 0   atorvastatin (LIPITOR) 40 MG tablet TAKE 1 TABLET(40 MG) BY MOUTH DAILY 30 tablet 11   Blood Glucose Monitoring Suppl (ONE TOUCH ULTRA 2) w/Device KIT 1 kit by Does not apply route in the morning, at noon, and at bedtime. 1 kit 0   cetirizine (ZYRTEC) 10 MG tablet Take 10 mg by mouth in the morning.     gabapentin (NEURONTIN) 800 MG tablet Take 800 mg by mouth 2 (two) times daily.  2   glucose blood test strip Use as instructed 300 each 6   HYDROcodone-acetaminophen (NORCO/VICODIN) 5-325 MG tablet Take 1 tablet by mouth in the morning, at noon, and at bedtime.  0   hydrOXYzine (ATARAX) 10 MG tablet TAKE 1 TABLET(10 MG) BY MOUTH THREE TIMES DAILY AS NEEDED 90 tablet 2   ibuprofen (ADVIL) 200 MG tablet Take 800 mg by mouth 2 (two) times daily as needed for moderate pain.     Lancets (ONETOUCH ULTRASOFT) lancets Use as instructed 300 each 6   lisinopril-hydrochlorothiazide (ZESTORETIC) 20-25 MG tablet TAKE 1 TABLET BY MOUTH DAILY 30 tablet 11   metFORMIN (GLUCOPHAGE) 1000 MG tablet TAKE 1 TABLET(1000 MG) BY MOUTH TWICE DAILY WITH A MEAL 180 tablet 0   PARoxetine (PAXIL) 40 MG tablet TAKE 1 TABLET(40 MG) BY MOUTH DAILY 90 tablet 0   promethazine (PHENERGAN) 25 MG tablet TAKE 1 TABLET BY MOUTH EVERY 8 HOURS AS NEEDED 30 tablet 1   SUMAtriptan (IMITREX) 100 MG tablet Take 1 tablet (100 mg total) by mouth every 2 (two) hours as needed for migraine. May  repeat in 2 hours if headache persists or recurs. 15 tablet 3   No current facility-administered medications on file prior to visit.    BP 112/80 (BP Location: Left Arm, Patient Position: Sitting, Cuff Size: Normal)   Pulse 80   Temp 97.9 F (36.6 C) (Oral)   Ht _0  (1.778 m)   Wt 186 lb 9.6 oz (84.6 kg)  SpO2 97%   BMI 26.77 kg/m       Objective:   Physical Exam Vitals and nursing note reviewed.  Constitutional:      Appearance: Normal appearance.  Cardiovascular:     Rate and Rhythm: Normal rate and regular rhythm.     Pulses: Normal pulses.     Heart sounds: Normal heart sounds.  Pulmonary:     Effort: Pulmonary effort is normal.     Breath sounds: Normal breath sounds.  Musculoskeletal:        General: Normal range of motion.  Skin:    General: Skin is warm and dry.     Findings: Rash present. Rash is crusting and pustular.          Comments: Red pustular rash noted throughout back and on chest. No active drainage noted.   Neurological:     General: No focal deficit present.     Mental Status: He is alert and oriented to person, place, and time.  Psychiatric:        Mood and Affect: Mood normal.        Behavior: Behavior normal.        Thought Content: Thought content normal.        Judgment: Judgment normal.       Assessment & Plan:  1. Controlled type 2 diabetes mellitus with complication, without long-term current use of insulin (HCC)  - POC HgB A1c- 6.4  - He has improved and now at goal  - Encouraged to monitor his BS with [facility] system  - Follow up in 3 months   2. Essential hypertension - Well controlled.  - No change in medications   3. Folliculitis  - sulfamethoxazole-trimethoprim (BACTRIM DS) 800-160 MG tablet; Take 1 tablet by mouth 2 (two) times daily for 10 days.  Dispense: 20 tablet; Refill: 0 - hydrOXYzine (ATARAX) 50 MG tablet; Take 1 tablet (50 mg total) by mouth 3 (three) times daily as needed.  Dispense: 30 tablet; Refill:  0  Dorothyann Peng, NP

## 2022-07-04 ENCOUNTER — Encounter: Payer: Self-pay | Admitting: Adult Health

## 2022-07-04 NOTE — Telephone Encounter (Signed)
Please advise 

## 2022-08-19 ENCOUNTER — Other Ambulatory Visit: Payer: Self-pay | Admitting: Adult Health

## 2022-08-19 DIAGNOSIS — F411 Generalized anxiety disorder: Secondary | ICD-10-CM

## 2022-08-19 DIAGNOSIS — E1165 Type 2 diabetes mellitus with hyperglycemia: Secondary | ICD-10-CM

## 2022-09-06 ENCOUNTER — Encounter: Payer: Self-pay | Admitting: Adult Health

## 2022-09-06 ENCOUNTER — Ambulatory Visit (INDEPENDENT_AMBULATORY_CARE_PROVIDER_SITE_OTHER): Payer: 59 | Admitting: Adult Health

## 2022-09-06 VITALS — BP 122/82 | HR 88 | Temp 98.2°F | Ht 70.0 in | Wt 181.2 lb

## 2022-09-06 DIAGNOSIS — G8929 Other chronic pain: Secondary | ICD-10-CM

## 2022-09-06 DIAGNOSIS — E785 Hyperlipidemia, unspecified: Secondary | ICD-10-CM | POA: Diagnosis not present

## 2022-09-06 DIAGNOSIS — E118 Type 2 diabetes mellitus with unspecified complications: Secondary | ICD-10-CM

## 2022-09-06 DIAGNOSIS — Z125 Encounter for screening for malignant neoplasm of prostate: Secondary | ICD-10-CM | POA: Diagnosis not present

## 2022-09-06 DIAGNOSIS — I10 Essential (primary) hypertension: Secondary | ICD-10-CM | POA: Diagnosis not present

## 2022-09-06 DIAGNOSIS — G43009 Migraine without aura, not intractable, without status migrainosus: Secondary | ICD-10-CM

## 2022-09-06 DIAGNOSIS — Z0000 Encounter for general adult medical examination without abnormal findings: Secondary | ICD-10-CM | POA: Diagnosis not present

## 2022-09-06 DIAGNOSIS — F411 Generalized anxiety disorder: Secondary | ICD-10-CM

## 2022-09-06 DIAGNOSIS — M545 Low back pain, unspecified: Secondary | ICD-10-CM

## 2022-09-06 LAB — CBC WITH DIFFERENTIAL/PLATELET
Basophils Absolute: 0 10*3/uL (ref 0.0–0.1)
Basophils Relative: 0.7 % (ref 0.0–3.0)
Eosinophils Absolute: 0.3 10*3/uL (ref 0.0–0.7)
Eosinophils Relative: 4.9 % (ref 0.0–5.0)
HCT: 45.4 % (ref 39.0–52.0)
Hemoglobin: 15.7 g/dL (ref 13.0–17.0)
Lymphocytes Relative: 26.4 % (ref 12.0–46.0)
Lymphs Abs: 1.5 10*3/uL (ref 0.7–4.0)
MCHC: 34.7 g/dL (ref 30.0–36.0)
MCV: 88.2 fl (ref 78.0–100.0)
Monocytes Absolute: 0.5 10*3/uL (ref 0.1–1.0)
Monocytes Relative: 8.8 % (ref 3.0–12.0)
Neutro Abs: 3.3 10*3/uL (ref 1.4–7.7)
Neutrophils Relative %: 59.2 % (ref 43.0–77.0)
Platelets: 343 10*3/uL (ref 150.0–400.0)
RBC: 5.15 Mil/uL (ref 4.22–5.81)
RDW: 13 % (ref 11.5–15.5)
WBC: 5.5 10*3/uL (ref 4.0–10.5)

## 2022-09-06 LAB — LIPID PANEL
Cholesterol: 164 mg/dL (ref 0–200)
HDL: 35 mg/dL — ABNORMAL LOW (ref >39.00)
NonHDL: 129.04
Total CHOL/HDL Ratio: 5
Triglycerides: 237 mg/dL — ABNORMAL HIGH (ref 0.0–149.0)
VLDL: 47.4 mg/dL — ABNORMAL HIGH (ref 0.0–40.0)

## 2022-09-06 LAB — COMPREHENSIVE METABOLIC PANEL
ALT: 31 U/L (ref 0–53)
AST: 16 U/L (ref 0–37)
Albumin: 5.2 g/dL (ref 3.5–5.2)
Alkaline Phosphatase: 57 U/L (ref 39–117)
BUN: 21 mg/dL (ref 6–23)
CO2: 29 mEq/L (ref 19–32)
Calcium: 10.6 mg/dL — ABNORMAL HIGH (ref 8.4–10.5)
Chloride: 97 mEq/L (ref 96–112)
Creatinine, Ser: 1.2 mg/dL (ref 0.40–1.50)
GFR: 70.97 mL/min (ref >60.00)
Glucose, Bld: 129 mg/dL — ABNORMAL HIGH (ref 70–99)
Potassium: 4.7 mEq/L (ref 3.5–5.1)
Sodium: 137 mEq/L (ref 135–145)
Total Bilirubin: 1 mg/dL (ref 0.2–1.2)
Total Protein: 7.7 g/dL (ref 6.0–8.3)

## 2022-09-06 LAB — LDL CHOLESTEROL, DIRECT: Direct LDL: 98 mg/dL

## 2022-09-06 LAB — HEMOGLOBIN A1C: Hgb A1c MFr Bld: 6.4 % (ref 4.6–6.5)

## 2022-09-06 LAB — TSH: TSH: 1.11 u[IU]/mL (ref 0.35–5.50)

## 2022-09-06 LAB — PSA: PSA: 1.72 ng/mL (ref 0.10–4.00)

## 2022-09-06 MED ORDER — AMITRIPTYLINE HCL 25 MG PO TABS: ORAL_TABLET | ORAL | 3 refills | Status: DC

## 2022-09-06 NOTE — Progress Notes (Signed)
Subjective:    Patient ID: Dustin Henry, male    DOB: 1971-03-06, 51 y.o.   MRN: [id number]  HPI Patient presents for yearly preventative medicine examination. He is a pleasant 51 year old male who  has a past medical history of ALLERGIC RHINITIS (08/18/2007), Allergy, Anxiety, BACK PAIN, Depression, Diabetes mellitus without complication ([place]), Difficult intubation, History of kidney stones, HYPERLIPIDEMIA (02/12/2010), HYPERTENSION, IBS (irritable bowel syndrome), MIGRAINE HEADACHE, Neuromuscular disorder ([place]), and Nocturia.  DM Type 2 -currently maintained with metformin 1000 mg twice daily.  He has been taking his medications as directed.  He is eating healthier and drinking more water throughout the day.  He does have a libre system to monitor his blood sugars but has not starting wearing it yet.  Lab Results  Component Value Date   HGBA1C 6.4 (A) 06/27/2022   Hypertension-managed with Norvasc 5 mg daily and lisinopril/hydrochlorothiazide 20-25 mg daily.  He denies dizziness, lightheadedness, chest pain, or shortness of breath BP Readings from Last 3 Encounters:  09/06/22 122/82  06/27/22 112/80  03/13/22 110/70   Hyperlipidemia-managed with Lipitor 40 mg daily.  He denies myalgia or fatigue Lab Results  Component Value Date   CHOL 226 (H) 01/12/2021   HDL 37.60 (L) 01/12/2021   LDLDIRECT 117.0 01/12/2021   TRIG 391.0 (H) 01/12/2021   CHOLHDL 6 01/12/2021   Anxiety and depression-managed with Paxil 40 mg daily and Atarax 10 mg QHS. Feels well controlled.   Migraine headaches-takes Imitrex as needed and Elavil 25 mg QHS. Has very few migraine headaches   Chronic back and shoulder pain-seen by orthopedics and is managed gabapentin and Norco.  All immunizations and health maintenance protocols were reviewed with the patient and needed orders were placed.  Appropriate screening laboratory values were ordered for the patient including screening of hyperlipidemia, renal  function and hepatic function. If indicated by BPH, a PSA was ordered.  Medication reconciliation,  past medical history, social history, problem list and allergies were reviewed in detail with the patient  Goals were established with regard to weight loss, exercise, and  diet in compliance with medications. He is outside working with Biomedical scientist and is eating healthier.  Wt Readings from Last 3 Encounters:  09/06/22 181 lb 3.2 oz (82.2 kg)  06/27/22 186 lb 9.6 oz (84.6 kg)  03/13/22 188 lb (85.3 kg)   He is up-to-date on routine colon cancer screening  Review of Systems  Constitutional: Negative.   HENT: Negative.    Eyes: Negative.   Respiratory: Negative.    Cardiovascular: Negative.   Gastrointestinal: Negative.   Endocrine: Negative.   Genitourinary: Negative.   Musculoskeletal:  Positive for back pain.  Skin: Negative.   Allergic/Immunologic: Negative.   Neurological: Negative.   Hematological: Negative.   Psychiatric/Behavioral: Negative.    All other systems reviewed and are negative.  Past Medical History:  Diagnosis Date   ALLERGIC RHINITIS 08/18/2007   Allergy    seasonal   Anxiety    BACK PAIN    herniated disc   Depression    takes Paxil daily   Diabetes mellitus without complication ([place])    Difficult intubation    09/05/10: difficult airway documented (ant cords vis with head up/cricoid press), MAC 4 used   History of kidney stones    HYPERLIPIDEMIA 02/12/2010   HYPERTENSION    takes Amlodipine and Lisinopril-HCTZ daily   IBS (irritable bowel syndrome)    MIGRAINE HEADACHE    takes Imitrex daily as needed  Neuromuscular disorder ([place])    nerve pain left leg post op back surgery   Nocturia     Social History   Socioeconomic History   Marital status: Married    Spouse name: Not on file   Number of children: 1   Years of education: Not on file   Highest education level: Not on file  Occupational History   Occupation: pressman helper     Comment: Engineer, structural  Tobacco Use   Smoking status: Former    Packs/day: 3.00    Years: 10.00    Total pack years: 30.00    Types: Cigarettes   Smokeless tobacco: Never   Tobacco comments:    quit smoking about 31yr ago  Substance and Sexual Activity   Alcohol use: Yes    Comment: occasionally   Drug use: No   Sexual activity: Not on file  Other Topics Concern   Not on file  Social History Narrative   Not on file   Social Determinants of Health   Financial Resource Strain: Not on file  Food Insecurity: Not on file  Transportation Needs: Not on file  Physical Activity: Not on file  Stress: Not on file  Social Connections: Not on file  Intimate Partner Violence: Not on file    Past Surgical History:  Procedure Laterality Date   COLONOSCOPY WITH PROPOFOL N/A 06/25/2021   Procedure: COLONOSCOPY WITH PROPOFOL;  Surgeon: BThornton Park MD;  Location: [facility];  Service: Gastroenterology;  Laterality: N/A;   CYSTOSCOPY     FINGER SURGERY     POLYPECTOMY  06/25/2021   Procedure: POLYPECTOMY;  Surgeon: BThornton Park MD;  [facility];  Service: Gastroenterology;;   POSTERIOR CERVICAL FUSION/FORAMINOTOMY N/A 11/22/2015   Procedure: TLIF L5-S1;  Surgeon: DMelina Schools MD;  [facility]  Service: Orthopedics;  Laterality: N/A;   SHOULDER SURGERY Right 03/2018   Dr. NVeverly Fells   Family History  Problem Relation Age of Onset   High blood pressure Father    Diabetes Father    Arthritis Father    Diabetes Paternal Grandmother    Heart disease Other        No family history   Colon cancer Neg Hx    Esophageal cancer Neg Hx    Colon polyps Neg Hx    Rectal cancer Neg Hx    Stomach cancer Neg Hx       Current Outpatient Medications on File Prior to Visit  Medication Sig Dispense Refill   amLODipine (NORVASC) 5 MG tablet TAKE 1 TABLET(5 MG) BY MOUTH DAILY 30 tablet 0   atorvastatin (LIPITOR) 40 MG tablet TAKE 1 TABLET(40 MG) BY MOUTH DAILY 30  tablet 11   Blood Glucose Monitoring Suppl (ONE TOUCH ULTRA 2) w/Device KIT 1 kit by Does not apply route in the morning, at noon, and at bedtime. 1 kit 0   cetirizine (ZYRTEC) 10 MG tablet Take 10 mg by mouth in the morning.     gabapentin (NEURONTIN) 800 MG tablet Take 800 mg by mouth 2 (two) times daily.  2   glucose blood test strip Use as instructed 300 each 6   HYDROcodone-acetaminophen (NORCO/VICODIN) 5-325 MG tablet Take 1 tablet by mouth in the morning, at noon, and at bedtime.  0   hydrOXYzine (ATARAX) 10 MG tablet TAKE 1 TABLET(10 MG) BY MOUTH THREE TIMES DAILY AS NEEDED 90 tablet 2   ibuprofen (ADVIL) 200 MG tablet Take 800 mg by mouth 2 (two) times daily as  needed for moderate pain.     Lancets (ONETOUCH ULTRASOFT) lancets Use as instructed 300 each 6   lisinopril-hydrochlorothiazide (ZESTORETIC) 20-25 MG tablet TAKE 1 TABLET BY MOUTH DAILY 30 tablet 11   metFORMIN (GLUCOPHAGE) 1000 MG tablet TAKE 1 TABLET(1000 MG) BY MOUTH TWICE DAILY WITH A MEAL 180 tablet 0   PARoxetine (PAXIL) 40 MG tablet TAKE 1 TABLET(40 MG) BY MOUTH DAILY 90 tablet 0   promethazine (PHENERGAN) 25 MG tablet TAKE 1 TABLET BY MOUTH EVERY 8 HOURS AS NEEDED 30 tablet 1   SUMAtriptan (IMITREX) 100 MG tablet Take 1 tablet (100 mg total) by mouth every 2 (two) hours as needed for migraine. May repeat in 2 hours if headache persists or recurs. 15 tablet 3   No current facility-administered medications on file prior to visit.    BP 122/82   Pulse 88   Temp 98.2 F (36.8 C)   Ht _0  (1.778 m)   Wt 181 lb 3.2 oz (82.2 kg)   SpO2 98%   BMI 26.00 kg/m       Objective:   Physical Exam Vitals and nursing note reviewed.  Constitutional:      General: He is not in acute distress.    Appearance: Normal appearance. He is well-developed and normal weight.  HENT:     Head: Normocephalic and atraumatic.     Right Ear: Tympanic membrane, ear canal and external ear normal. There is no impacted cerumen.     Left  Ear: Tympanic membrane, ear canal and external ear normal. There is no impacted cerumen.     Nose: Nose normal. No congestion or rhinorrhea.     Mouth/Throat:     Mouth: Mucous membranes are moist.     Pharynx: Oropharynx is clear. No oropharyngeal exudate or posterior oropharyngeal erythema.  Eyes:     General:        Right eye: No discharge.        Left eye: No discharge.     Extraocular Movements: Extraocular movements intact.     Conjunctiva/sclera: Conjunctivae normal.     Pupils: Pupils are equal, round, and reactive to light.  Neck:     Vascular: No carotid bruit.     Trachea: No tracheal deviation.  Cardiovascular:     Rate and Rhythm: Normal rate and regular rhythm.     Pulses: Normal pulses.     Heart sounds: Normal heart sounds. No murmur heard.    No friction rub. No gallop.  Pulmonary:     Effort: Pulmonary effort is normal. No respiratory distress.     Breath sounds: Normal breath sounds. No stridor. No wheezing, rhonchi or rales.  Chest:     Chest wall: No tenderness.  Abdominal:     General: Bowel sounds are normal. There is no distension.     Palpations: Abdomen is soft. There is no mass.     Tenderness: There is no abdominal tenderness. There is no right CVA tenderness, left CVA tenderness, guarding or rebound.     Hernia: No hernia is present.  Musculoskeletal:        General: No swelling, tenderness, deformity or signs of injury. Normal range of motion.     Right lower leg: No edema.     Left lower leg: No edema.  Lymphadenopathy:     Cervical: No cervical adenopathy.  Skin:    General: Skin is warm and dry.     Capillary Refill: Capillary refill takes less than 2 seconds.  Coloration: Skin is not jaundiced or pale.     Findings: No bruising, erythema, lesion or rash.  Neurological:     General: No focal deficit present.     Mental Status: He is alert and oriented to person, place, and time.     Cranial Nerves: No cranial nerve deficit.     Sensory:  No sensory deficit.     Motor: No weakness.     Coordination: Coordination normal.     Gait: Gait normal.     Deep Tendon Reflexes: Reflexes normal.  Psychiatric:        Mood and Affect: Mood normal.        Behavior: Behavior normal.        Thought Content: Thought content normal.        Judgment: Judgment normal.       Assessment & Plan:  1. Routine general medical examination at a health care facility - Continue to stay active and eat healthier - Follow up in one year or sooner if needed - CBC with Differential/Platelet; Future - Comprehensive metabolic panel; Future - Hemoglobin A1c; Future - Lipid panel; Future - TSH; Future - TSH - Lipid panel - Hemoglobin A1c - Comprehensive metabolic panel - CBC with Differential/Platelet  2. Controlled type 2 diabetes mellitus with complication, without long-term current use of insulin ([place]) - Consider adding agent  - 3 month follow up  - CBC with Differential/Platelet; Future - Comprehensive metabolic panel; Future - Hemoglobin A1c; Future - Lipid panel; Future - TSH; Future - TSH - Lipid panel - Hemoglobin A1c - Comprehensive metabolic panel - CBC with Differential/Platelet  3. Essential hypertension - Well controlled. No change in medications  - CBC with Differential/Platelet; Future - Comprehensive metabolic panel; Future - Hemoglobin A1c; Future - Lipid panel; Future - TSH; Future - TSH - Lipid panel - Hemoglobin A1c - Comprehensive metabolic panel - CBC with Differential/Platelet  4. Anxiety state - Well controlled on current medications  - CBC with Differential/Platelet; Future - Comprehensive metabolic panel; Future - Hemoglobin A1c; Future - Lipid panel; Future - TSH; Future - TSH - Lipid panel - Hemoglobin A1c - Comprehensive metabolic panel - CBC with Differential/Platelet  5. Prostate cancer screening  - PSA; Future - PSA  6. Dyslipidemia - Consider increase in statin  - CBC with  Differential/Platelet; Future - Comprehensive metabolic panel; Future - Hemoglobin A1c; Future - Lipid panel; Future - TSH; Future - TSH - Lipid panel - Hemoglobin A1c - Comprehensive metabolic panel - CBC with Differential/Platelet  7. Migraine without aura and without status migrainosus, not intractable - Well controlled.  - CBC with Differential/Platelet; Future - Comprehensive metabolic panel; Future - Hemoglobin A1c; Future - Lipid panel; Future - TSH; Future - amitriptyline (ELAVIL) 25 MG tablet; TAKE 1 TABLET(25 MG) BY MOUTH AT BEDTIME  Dispense: 90 tablet; Refill: 3 - TSH - Lipid panel - Hemoglobin A1c - Comprehensive metabolic panel - CBC with Differential/Platelet  8. Chronic low back pain without sciatica, unspecified back pain laterality - Follow up with Dr. Steffanie Dunn, NP

## 2022-11-03 ENCOUNTER — Other Ambulatory Visit: Payer: Self-pay | Admitting: Adult Health

## 2022-11-03 DIAGNOSIS — G43009 Migraine without aura, not intractable, without status migrainosus: Secondary | ICD-10-CM

## 2022-11-09 ENCOUNTER — Other Ambulatory Visit: Payer: Self-pay | Admitting: Adult Health

## 2022-11-09 DIAGNOSIS — E1165 Type 2 diabetes mellitus with hyperglycemia: Secondary | ICD-10-CM

## 2022-11-09 DIAGNOSIS — F411 Generalized anxiety disorder: Secondary | ICD-10-CM

## 2022-11-25 NOTE — Progress Notes (Unsigned)
ACUTE VISIT No chief complaint on file.  HPI: [name] is a 51 y.o. male, who is here today complaining of *** HPI  Review of Systems See other pertinent positives and negatives in HPI.  Current Outpatient Medications on File Prior to Visit  Medication Sig Dispense Refill   amitriptyline (ELAVIL) 25 MG tablet TAKE 1 TABLET(25 MG) BY MOUTH AT BEDTIME 90 tablet 3   amLODipine (NORVASC) 5 MG tablet TAKE 1 TABLET(5 MG) BY MOUTH DAILY 30 tablet 0   atorvastatin (LIPITOR) 40 MG tablet TAKE 1 TABLET(40 MG) BY MOUTH DAILY 30 tablet 11   Blood Glucose Monitoring Suppl (ONE TOUCH ULTRA 2) w/Device KIT 1 kit by Does not apply route in the morning, at noon, and at bedtime. 1 kit 0   cetirizine (ZYRTEC) 10 MG tablet Take 10 mg by mouth in the morning.     gabapentin (NEURONTIN) 800 MG tablet Take 800 mg by mouth 2 (two) times daily.  2   glucose blood test strip Use as instructed 300 each 6   HYDROcodone-acetaminophen (NORCO/VICODIN) 5-325 MG tablet Take 1 tablet by mouth in the morning, at noon, and at bedtime.  0   hydrOXYzine (ATARAX) 10 MG tablet TAKE 1 TABLET(10 MG) BY MOUTH THREE TIMES DAILY AS NEEDED 90 tablet 2   ibuprofen (ADVIL) 200 MG tablet Take 800 mg by mouth 2 (two) times daily as needed for moderate pain.     Lancets (ONETOUCH ULTRASOFT) lancets Use as instructed 300 each 6   lisinopril-hydrochlorothiazide (ZESTORETIC) 20-25 MG tablet TAKE 1 TABLET BY MOUTH DAILY 30 tablet 11   metFORMIN (GLUCOPHAGE) 1000 MG tablet TAKE 1 TABLET(1000 MG) BY MOUTH TWICE DAILY WITH A MEAL 180 tablet 0   PARoxetine (PAXIL) 40 MG tablet TAKE 1 TABLET(40 MG) BY MOUTH DAILY 90 tablet 0   promethazine (PHENERGAN) 25 MG tablet TAKE 1 TABLET BY MOUTH EVERY 8 HOURS AS NEEDED 30 tablet 1   SUMAtriptan (IMITREX) 100 MG tablet TAKE 1 TABLET(100 MG) BY MOUTH EVERY 2 HOURS AS NEEDED FOR MIGRAINE. MAY REPEAT IN 2 HOURS IF HEADACHE PERSISTS OR RECURS 15 tablet 3   No current facility-administered  medications on file prior to visit.    Past Medical History:  Diagnosis Date   ALLERGIC RHINITIS 08/18/2007   Allergy    seasonal   Anxiety    BACK PAIN    herniated disc   Depression    takes Paxil daily   Diabetes mellitus without complication ([place])    Difficult intubation    09/05/10: difficult airway documented (ant cords vis with head up/cricoid press), MAC 4 used   History of kidney stones    HYPERLIPIDEMIA 02/12/2010   HYPERTENSION    takes Amlodipine and Lisinopril-HCTZ daily   IBS (irritable bowel syndrome)    MIGRAINE HEADACHE    takes Imitrex daily as needed   Neuromuscular disorder ([facility])    nerve pain left leg post op back surgery   Nocturia    Allergies  Allergen Reactions   Doxycycline Nausea Only   Other     Cough medication "starts with a B made him sick", cannot recall name or exact reaction   Penicillins Other (See Comments)    Has patient had a PCN reaction causing immediate rash, facial/tongue/throat swelling, SOB or lightheadedness with hypotension: {Yes/No:30480221} Has patient had a PCN reaction causing severe rash involving mucus membranes or skin necrosis: {Yes/No:30480221} Has patient had a PCN reaction that required hospitalization {Yes/No:30480221} Has patient had a  PCN reaction occurring within the last 10 years: {Yes/No:30480221} If all of the above answers are "NO", then may proceed with Cephalosporin use.   Tessalon [Benzonatate] Other (See Comments)    Unknown     Social History   Socioeconomic History   Marital status: Married    Spouse name: Not on file   Number of children: 1   Years of education: Not on file   Highest education level: Not on file  Occupational History   Occupation: pressman helper    Comment: Engineer, structural  Tobacco Use   Smoking status: Former    Packs/day: 3.00    Years: 10.00    Total pack years: 30.00    Types: Cigarettes   Smokeless tobacco: Never   Tobacco comments:    quit smoking about 66yr ago   Substance and Sexual Activity   Alcohol use: Yes    Comment: occasionally   Drug use: No   Sexual activity: Not on file  Other Topics Concern   Not on file  Social History Narrative   Not on file   Social Determinants of Health   Financial Resource Strain: Not on file  Food Insecurity: Not on file  Transportation Needs: Not on file  Physical Activity: Not on file  Stress: Not on file  Social Connections: Not on file    There were no vitals filed for this visit. There is no height or weight on file to calculate BMI.  Physical Exam  ASSESSMENT AND PLAN: There are no diagnoses linked to this encounter.  No follow-ups on file.  Joelle Roswell G. JMartinique MD  [facility].  Discharge Instructions   None

## 2022-11-26 ENCOUNTER — Encounter: Payer: Self-pay | Admitting: Family Medicine

## 2022-11-26 ENCOUNTER — Ambulatory Visit: Payer: 59 | Admitting: Family Medicine

## 2022-11-26 VITALS — BP 120/80 | HR 82 | Temp 97.8°F | Resp 12 | Ht 70.0 in | Wt 192.5 lb

## 2022-11-26 DIAGNOSIS — M5412 Radiculopathy, cervical region: Secondary | ICD-10-CM

## 2022-11-26 DIAGNOSIS — G43009 Migraine without aura, not intractable, without status migrainosus: Secondary | ICD-10-CM | POA: Diagnosis not present

## 2022-11-26 DIAGNOSIS — R519 Headache, unspecified: Secondary | ICD-10-CM | POA: Diagnosis not present

## 2022-11-26 NOTE — Patient Instructions (Addendum)
A few things to remember from today's visit:  Migraine without aura and without status migrainosus, not intractable  Persistent headaches  Cervical radiculopathy  It seems improved, so I do not think you need imaging of your head today. If it gets worse again, please go to the ED/ER. Can be tension headache and aggravated by neck pain. Continue imitrex. Chronic pain meds can also cause headaches. Take Phenergan for nausea.  If you need refills for medications you take chronically, please call your pharmacy. Do not use My Chart to request refills or for acute issues that need immediate attention. If you send a my chart message, it may take a few days to be addressed, specially if I am not in the office.  Please be sure medication list is accurate. If a new problem present, please set up appointment sooner than planned today.

## 2022-11-28 ENCOUNTER — Ambulatory Visit: Payer: 59 | Admitting: Adult Health

## 2022-12-10 ENCOUNTER — Encounter: Payer: Self-pay | Admitting: Adult Health

## 2022-12-10 ENCOUNTER — Ambulatory Visit (INDEPENDENT_AMBULATORY_CARE_PROVIDER_SITE_OTHER): Payer: 59 | Admitting: Adult Health

## 2022-12-10 VITALS — BP 120/82 | HR 88 | Temp 98.1°F | Ht 70.0 in | Wt 190.0 lb

## 2022-12-10 DIAGNOSIS — R519 Headache, unspecified: Secondary | ICD-10-CM | POA: Diagnosis not present

## 2022-12-10 DIAGNOSIS — G43009 Migraine without aura, not intractable, without status migrainosus: Secondary | ICD-10-CM | POA: Diagnosis not present

## 2022-12-10 DIAGNOSIS — I10 Essential (primary) hypertension: Secondary | ICD-10-CM | POA: Diagnosis not present

## 2022-12-10 MED ORDER — METHYLPREDNISOLONE ACETATE 80 MG/ML IJ SUSP
80.0000 mg | Freq: Once | INTRAMUSCULAR | Status: AC
  Administered 2022-12-10: 80 mg via INTRAMUSCULAR

## 2022-12-10 MED ORDER — AMLODIPINE BESYLATE 5 MG PO TABS: ORAL_TABLET | ORAL | 3 refills | Status: DC

## 2022-12-10 MED ORDER — KETOROLAC TROMETHAMINE 60 MG/2ML IM SOLN
60.0000 mg | Freq: Once | INTRAMUSCULAR | Status: AC
  Administered 2022-12-10: 60 mg via INTRAMUSCULAR

## 2022-12-10 MED ORDER — METHYLPREDNISOLONE ACETATE 80 MG/ML IJ SUSP: 80.0000 mg | Freq: Once | INTRAMUSCULAR | Status: DC

## 2022-12-10 MED ORDER — AMITRIPTYLINE HCL 50 MG PO TABS: 50.0000 mg | ORAL_TABLET | Freq: Every day | ORAL | 3 refills | Status: DC

## 2022-12-10 NOTE — Progress Notes (Signed)
Subjective:    Patient ID: Dustin Henry, male    DOB: [birth year], 52 y.o.   MRN: [id number]  HPI 52 year old male who  has a past medical history of ALLERGIC RHINITIS (08/18/2007), Allergy, Anxiety, BACK PAIN, Depression, Diabetes mellitus without complication ([place]), Difficult intubation, History of kidney stones, HYPERLIPIDEMIA (02/12/2010), HYPERTENSION, IBS (irritable bowel syndrome), MIGRAINE HEADACHE, Neuromuscular disorder ([place]), and Nocturia.  He presents to the office today for follow up regarding headache. He has a history of migraine headaches but has been well controlled on Elavil 25 mg and Imitrex PRN.   Reports that he is being seen by Dr. Veverly Fells at [facility] for a " collapsed disc in his neck". He was having a lot of numbness and tingling in his his left arm  - was sent to PT which has helped " tremendously". Orthopedics is holding off on MRI until he is finished with PT   Over the last few weeks he has had a pretty constant headache and has had two migraines in the last two weeks.   He also needs a refill of Norvasc      Review of Systems See HPI   Past Medical History:  Diagnosis Date   ALLERGIC RHINITIS 08/18/2007   Allergy    seasonal   Anxiety    BACK PAIN    herniated disc   Depression    takes Paxil daily   Diabetes mellitus without complication ([place])    Difficult intubation    09/05/10: difficult airway documented (ant cords vis with head up/cricoid press), MAC 4 used   History of kidney stones    HYPERLIPIDEMIA 02/12/2010   HYPERTENSION    takes Amlodipine and Lisinopril-HCTZ daily   IBS (irritable bowel syndrome)    MIGRAINE HEADACHE    takes Imitrex daily as needed   Neuromuscular disorder (HCC)    nerve pain left leg post op back surgery   Nocturia     Social History   Socioeconomic History   Marital status: Married    Spouse name: Not on file   Number of children: 1   Years of education: Not on file   Highest education level:  Not on file  Occupational History   Occupation: pressman helper    Comment: Engineer, structural  Tobacco Use   Smoking status: Former    Packs/day: 3.00    Years: 10.00    Total pack years: 30.00    Types: Cigarettes   Smokeless tobacco: Never   Tobacco comments:    quit smoking about 32yr ago  Substance and Sexual Activity   Alcohol use: Yes    Comment: occasionally   Drug use: No   Sexual activity: Not on file  Other Topics Concern   Not on file  Social History Narrative   Not on file   Social Determinants of Health   Financial Resource Strain: Not on file  Food Insecurity: Not on file  Transportation Needs: Not on file  Physical Activity: Not on file  Stress: Not on file  Social Connections: Not on file  Intimate Partner Violence: Not on file    Past Surgical History:  Procedure Laterality Date   COLONOSCOPY WITH PROPOFOL N/A 06/25/2021   Procedure: COLONOSCOPY WITH PROPOFOL;  Surgeon: BThornton Park MD;  Location: [facility];  Service: Gastroenterology;  Laterality: N/A;   CYSTOSCOPY     FINGER SURGERY     POLYPECTOMY  06/25/2021   Procedure: POLYPECTOMY;  Surgeon: BThornton Park MD;  Location:  [facility];  Service: Gastroenterology;;   POSTERIOR CERVICAL FUSION/FORAMINOTOMY N/A 11/22/2015   Procedure: TLIF L5-S1;  Surgeon: Melina Schools, MD;  Location: [facility];  Service: Orthopedics;  Laterality: N/A;   SHOULDER SURGERY Right 03/2018   Dr. Veverly Fells    Family History  Problem Relation Age of Onset   High blood pressure Father    Diabetes Father    Arthritis Father    Diabetes Paternal Grandmother    Heart disease Other        No family history   Colon cancer Neg Hx    Esophageal cancer Neg Hx    Colon polyps Neg Hx    Rectal cancer Neg Hx    Stomach cancer Neg Hx       Current Outpatient Medications on File Prior to Visit  Medication Sig Dispense Refill   amitriptyline (ELAVIL) 25 MG tablet TAKE 1 TABLET(25 MG) BY MOUTH AT BEDTIME 90 tablet 3    amLODipine (NORVASC) 5 MG tablet TAKE 1 TABLET(5 MG) BY MOUTH DAILY 30 tablet 0   atorvastatin (LIPITOR) 40 MG tablet TAKE 1 TABLET(40 MG) BY MOUTH DAILY 30 tablet 11   Blood Glucose Monitoring Suppl (ONE TOUCH ULTRA 2) w/Device KIT 1 kit by Does not apply route in the morning, at noon, and at bedtime. 1 kit 0   cetirizine (ZYRTEC) 10 MG tablet Take 10 mg by mouth in the morning.     gabapentin (NEURONTIN) 800 MG tablet Take 800 mg by mouth 2 (two) times daily.  2   glucose blood test strip Use as instructed 300 each 6   HYDROcodone-acetaminophen (NORCO/VICODIN) 5-325 MG tablet Take 1 tablet by mouth in the morning, at noon, and at bedtime.  0   hydrOXYzine (ATARAX) 10 MG tablet TAKE 1 TABLET(10 MG) BY MOUTH THREE TIMES DAILY AS NEEDED 90 tablet 2   ibuprofen (ADVIL) 200 MG tablet Take 800 mg by mouth 2 (two) times daily as needed for moderate pain.     Lancets (ONETOUCH ULTRASOFT) lancets Use as instructed 300 each 6   lisinopril-hydrochlorothiazide (ZESTORETIC) 20-25 MG tablet TAKE 1 TABLET BY MOUTH DAILY 30 tablet 11   metFORMIN (GLUCOPHAGE) 1000 MG tablet TAKE 1 TABLET(1000 MG) BY MOUTH TWICE DAILY WITH A MEAL 180 tablet 0   PARoxetine (PAXIL) 40 MG tablet TAKE 1 TABLET(40 MG) BY MOUTH DAILY 90 tablet 0   promethazine (PHENERGAN) 25 MG tablet TAKE 1 TABLET BY MOUTH EVERY 8 HOURS AS NEEDED 30 tablet 1   SUMAtriptan (IMITREX) 100 MG tablet TAKE 1 TABLET(100 MG) BY MOUTH EVERY 2 HOURS AS NEEDED FOR MIGRAINE. MAY REPEAT IN 2 HOURS IF HEADACHE PERSISTS OR RECURS 15 tablet 3   No current facility-administered medications on file prior to visit.    There were no vitals taken for this visit.      Objective:   Physical Exam Vitals and nursing note reviewed.  Constitutional:      Appearance: Normal appearance.  Cardiovascular:     Rate and Rhythm: Normal rate and regular rhythm.     Pulses: Normal pulses.     Heart sounds: Normal heart sounds.  Pulmonary:     Effort: Pulmonary effort is  normal.     Breath sounds: Normal breath sounds.  Musculoskeletal:        General: Normal range of motion.  Skin:    General: Skin is warm and dry.  Neurological:     General: No focal deficit present.     Mental Status: He  is alert and oriented to person, place, and time.  Psychiatric:        Mood and Affect: Mood normal.        Behavior: Behavior normal.        Thought Content: Thought content normal.       Assessment & Plan:  1. Migraine without aura and without status migrainosus, not intractable - Toradol and Depo Medrol injection today to see if we can abort his headache. Will increase Elavil to 50 mg QHS  - Follow up in 3-4 days if not resolved  - amitriptyline (ELAVIL) 50 MG tablet; Take 1 tablet (50 mg total) by mouth at bedtime.  Dispense: 90 tablet; Refill: 3 - ketorolac (TORADOL) injection 60 mg - methylPREDNISolone acetate (DEPO-MEDROL) injection 80 mg  2. Persistent headaches - ketorolac (TORADOL) injection 60 mg - methylPREDNISolone acetate (DEPO-MEDROL) injection 80 mg  3. Essential hypertension  - amLODipine (NORVASC) 5 MG tablet; TAKE 1 TABLET(5 MG) BY MOUTH DAILY  Dispense: 90 tablet; Refill: 3  Dorothyann Peng, NP

## 2022-12-10 NOTE — Patient Instructions (Signed)
It was great seeing you today   I have given you a steroid injection and a injection of a pain medication called Toradol to hopefully abort your headache.   I have also increased your Elavil to 50 mg nightly.

## 2022-12-16 ENCOUNTER — Encounter: Payer: Self-pay | Admitting: Adult Health

## 2023-01-06 ENCOUNTER — Other Ambulatory Visit: Payer: Self-pay | Admitting: Adult Health

## 2023-01-06 DIAGNOSIS — I10 Essential (primary) hypertension: Secondary | ICD-10-CM

## 2023-01-11 ENCOUNTER — Other Ambulatory Visit: Payer: Self-pay | Admitting: Adult Health

## 2023-01-11 DIAGNOSIS — I10 Essential (primary) hypertension: Secondary | ICD-10-CM

## 2023-02-09 ENCOUNTER — Other Ambulatory Visit: Payer: Self-pay | Admitting: Adult Health

## 2023-02-09 DIAGNOSIS — E785 Hyperlipidemia, unspecified: Secondary | ICD-10-CM

## 2023-02-09 DIAGNOSIS — F411 Generalized anxiety disorder: Secondary | ICD-10-CM

## 2023-02-09 DIAGNOSIS — E1165 Type 2 diabetes mellitus with hyperglycemia: Secondary | ICD-10-CM

## 2023-03-20 ENCOUNTER — Other Ambulatory Visit: Payer: Self-pay | Admitting: Adult Health

## 2023-03-20 DIAGNOSIS — F411 Generalized anxiety disorder: Secondary | ICD-10-CM

## 2023-03-21 MED ORDER — PAROXETINE HCL 40 MG PO TABS: 40.0000 mg | ORAL_TABLET | Freq: Every day | ORAL | 0 refills | Status: DC

## 2023-05-07 ENCOUNTER — Other Ambulatory Visit: Payer: Self-pay

## 2023-05-07 DIAGNOSIS — Z5321 Procedure and treatment not carried out due to patient leaving prior to being seen by health care provider: Secondary | ICD-10-CM | POA: Insufficient documentation

## 2023-05-07 DIAGNOSIS — S40811A Abrasion of right upper arm, initial encounter: Secondary | ICD-10-CM | POA: Diagnosis not present

## 2023-05-07 DIAGNOSIS — S51832A Puncture wound without foreign body of left forearm, initial encounter: Secondary | ICD-10-CM | POA: Diagnosis not present

## 2023-05-07 DIAGNOSIS — W540XXA Bitten by dog, initial encounter: Secondary | ICD-10-CM | POA: Insufficient documentation

## 2023-05-08 ENCOUNTER — Encounter: Payer: Self-pay | Admitting: Family Medicine

## 2023-05-08 ENCOUNTER — Encounter (HOSPITAL_BASED_OUTPATIENT_CLINIC_OR_DEPARTMENT_OTHER): Payer: Self-pay | Admitting: Emergency Medicine

## 2023-05-08 ENCOUNTER — Ambulatory Visit: Payer: BC Managed Care – PPO | Admitting: Family Medicine

## 2023-05-08 ENCOUNTER — Emergency Department (HOSPITAL_BASED_OUTPATIENT_CLINIC_OR_DEPARTMENT_OTHER)
Admission: EM | Admit: 2023-05-08 | Discharge: 2023-05-08 | Discharge status: Against Medical Advice | Payer: BC Managed Care – PPO | Attending: Emergency Medicine | Admitting: Emergency Medicine

## 2023-05-08 VITALS — BP 136/78 | HR 82 | Temp 98.4°F | Wt 178.4 lb

## 2023-05-08 DIAGNOSIS — M79632 Pain in left forearm: Secondary | ICD-10-CM | POA: Diagnosis not present

## 2023-05-08 DIAGNOSIS — S51842A Puncture wound with foreign body of left forearm, initial encounter: Secondary | ICD-10-CM | POA: Diagnosis not present

## 2023-05-08 DIAGNOSIS — W540XXA Bitten by dog, initial encounter: Secondary | ICD-10-CM

## 2023-05-08 MED ORDER — METRONIDAZOLE 500 MG PO TABS
500.0000 mg | ORAL_TABLET | Freq: Three times a day (TID) | ORAL | 0 refills | Status: AC
Start: 2023-05-08 — End: 2023-05-13

## 2023-05-08 MED ORDER — SULFAMETHOXAZOLE-TRIMETHOPRIM 800-160 MG PO TABS
1.0000 | ORAL_TABLET | Freq: Two times a day (BID) | ORAL | 0 refills | Status: AC
Start: 2023-05-08 — End: 2023-05-15

## 2023-05-08 NOTE — Progress Notes (Signed)
Established Patient Office Visit   Subjective  Patient ID: Dustin Henry, male    DOB: 04-04-1971  Age: 52 y.o. MRN: [id number]  Chief Complaint  Patient presents with   Animal Bite    Last night his dog bit him on the left arm. Dog has had all shots. Dog is aggressive when it comes to food, was getting aggressive with other dogs and pt tried to prevent it but dog attacked  and bit him. Went to [facility] but did not wait to be seen. Has nerve pain in left arm, but bite is causing pain and burning.    Patient is a 52 year old male followed by Shirline Frees, NP and seen for acute concern.  Patient bit on L forearm by his rescue pitbull last night.  Dog's vaccines are up to date.  Went to ER, wound washed with saline, but pt LWOB due to wait time.  Pt is L handed.  Denies weakness in L hand, tightness in forearm or hand, drainage from wounds.  Last tetanus shot 02/24/2019.  Patient with history of penicillin allergy, unsure of reaction.  Animal Bite  The incident occurred yesterday. The incident occurred at home. He came to the ER via personal transport. There is an injury to the Left forearm. The pain is moderate. It is unknown if a foreign body is present. He is Left-handed. His tetanus status is UTD.      ROS Negative unless stated above    Objective:     BP 136/78 (BP Location: Right Arm, Patient Position: Sitting, Cuff Size: Normal)   Pulse 82   Temp 98.4 F (36.9 C) (Oral)   Wt 178 lb 6.4 oz (80.9 kg)   SpO2 96%   BMI 25.60 kg/m    Physical Exam Constitutional:      Appearance: Normal appearance.  HENT:     Head: Normocephalic and atraumatic.     Nose: Nose normal.     Mouth/Throat:     Mouth: Mucous membranes are moist.  Eyes:     Extraocular Movements: Extraocular movements intact.     Conjunctiva/sclera: Conjunctivae normal.  Cardiovascular:     Rate and Rhythm: Normal rate.  Pulmonary:     Effort: Pulmonary effort is normal.  Musculoskeletal:         General: Signs of injury present. Normal range of motion.     Comments: Normal ROM of left UE.  No edema in left hand or tenderness.  Compartments soft.  Skin:    General: Skin is warm and dry.          Comments: L forearm with several puncture wounds/bite marks.  Majority with eschar.  Mild TTP of left forearm.    Neurological:     Mental Status: He is alert and oriented to person, place, and time.      No results found for any visits on 05/08/23.    Assessment & Plan:  Left forearm pain -     metroNIDAZOLE; Take 1 tablet (500 mg total) by mouth 3 (three) times daily for 5 days.  Dispense: 15 tablet; Refill: 0 -     Sulfamethoxazole-Trimethoprim; Take 1 tablet by mouth 2 (two) times daily for 7 days.  Dispense: 14 tablet; Refill: 0  Dog bite, initial encounter -     metroNIDAZOLE; Take 1 tablet (500 mg total) by mouth 3 (three) times daily for 5 days.  Dispense: 15 tablet; Refill: 0 -     Sulfamethoxazole-Trimethoprim; Take 1 tablet by  mouth 2 (two) times daily for 7 days.  Dispense: 14 tablet; Refill: 0   Start PPx antibiotics for animal bite.  Given history of penicillin allergy will start Flagyl and Bactrim.  Tetanus up-to-date done 02/24/2019.  Advised to keep area clean and dry using gentle antibacterial soap.  Discussed S/S of infection.  Report bite to local HD.  Return if symptoms worsen or fail to improve.   Dustin Saint, MD

## 2023-05-08 NOTE — ED Triage Notes (Signed)
Dog bite. Happened around 11pm Personal dog. Up to date on vaccines Puncture type would on left lower arm and scratches to right arm Pitbull mix, first time bite, [place]. Per patient animal control was notified

## 2023-07-23 ENCOUNTER — Ambulatory Visit: Payer: BC Managed Care – PPO | Admitting: Adult Health

## 2023-07-23 ENCOUNTER — Encounter: Payer: Self-pay | Admitting: Adult Health

## 2023-07-23 VITALS — BP 120/80 | HR 105 | Temp 98.1°F | Ht 70.0 in | Wt 169.0 lb

## 2023-07-23 DIAGNOSIS — R5383 Other fatigue: Secondary | ICD-10-CM | POA: Diagnosis not present

## 2023-07-23 DIAGNOSIS — R197 Diarrhea, unspecified: Secondary | ICD-10-CM | POA: Diagnosis not present

## 2023-07-23 DIAGNOSIS — A084 Viral intestinal infection, unspecified: Secondary | ICD-10-CM

## 2023-07-23 LAB — POC COVID19 BINAXNOW: SARS Coronavirus 2 Ag: NEGATIVE

## 2023-07-23 MED ORDER — ONDANSETRON HCL 8 MG PO TABS
8.0000 mg | ORAL_TABLET | Freq: Three times a day (TID) | ORAL | 0 refills | Status: DC | PRN
Start: 2023-07-23 — End: 2024-06-22

## 2023-07-23 MED ORDER — ONDANSETRON HCL 4 MG/2ML IJ SOLN
4.0000 mg | Freq: Once | INTRAMUSCULAR | Status: AC
Start: 2023-07-23 — End: 2023-07-23
  Administered 2023-07-23: 4 mg via INTRAMUSCULAR

## 2023-07-23 NOTE — Progress Notes (Addendum)
Subjective:    Patient ID: Dustin Henry, male    DOB: Jan 21, 1971, 52 y.o.   MRN: [id number]  HPI 52 year old male who  has a past medical history of ALLERGIC RHINITIS (08/18/2007), Allergy, Anxiety, BACK PAIN, Depression, Diabetes mellitus without complication ([facility]), Difficult intubation, History of kidney stones, HYPERLIPIDEMIA (02/12/2010), HYPERTENSION, IBS (irritable bowel syndrome), MIGRAINE HEADACHE, Neuromuscular disorder ([facility]), and Nocturia.  He presents to the office today for an acute issue. His symptoms have been present for 4 days.   He reports that the evening where his symptoms started he had 2 episodes of vomiting, when he woke up the next day he had nausea, 1 episode of vomiting, felt fatigued, had 2 episodes of diarrhea and low grade fever up to 100.  The next day he had an episode of vomiting continue to feel nauseous and fatigued.  This morning when he woke up he had an episode of diarrhea that appeared "black" but has not had a bowel movement since.  He has not had any vomiting today but has continued to feel nauseous.  Associated symptoms include decreased appetite  Denies eating any suspicious foods.  No one else in the home is sick with the same symptoms.  At home he has been using his prescribed Phenergan without any improvement in nausea as well as Motrin to help with low-grade fever   He has been trying to force fluids stay hydrated   Review of Systems See HPI   Past Medical History:  Diagnosis Date   ALLERGIC RHINITIS 08/18/2007   Allergy    seasonal   Anxiety    BACK PAIN    herniated disc   Depression    takes Paxil daily   Diabetes mellitus without complication ([facility])    Difficult intubation    09/05/10: difficult airway documented (ant cords vis with head up/cricoid press), MAC 4 used   History of kidney stones    HYPERLIPIDEMIA 02/12/2010   HYPERTENSION    takes Amlodipine and Lisinopril-HCTZ daily   IBS (irritable bowel syndrome)     MIGRAINE HEADACHE    takes Imitrex daily as needed   Neuromuscular disorder (HCC)    nerve pain left leg post op back surgery   Nocturia     Social History   Socioeconomic History   Marital status: Married    Spouse name: Not on file   Number of children: 1   Years of education: Not on file   Highest education level: Not on file  Occupational History   Occupation: pressman helper    Comment: Presenter, broadcasting  Tobacco Use   Smoking status: Former    Current packs/day: 3.00    Average packs/day: 3.0 packs/day for 10.0 years (30.0 ttl pk-yrs)    Types: Cigarettes   Smokeless tobacco: Never   Tobacco comments:    quit smoking about 73yrs ago  Substance and Sexual Activity   Alcohol use: Yes    Comment: occasionally   Drug use: No   Sexual activity: Not on file  Other Topics Concern   Not on file  Social History Narrative   Not on file   Social Determinants of Health   Financial Resource Strain: Not on file  Food Insecurity: Not on file  Transportation Needs: Not on file  Physical Activity: Not on file  Stress: Not on file  Social Connections: Not on file  Intimate Partner Violence: Not on file    Past Surgical History:  Procedure Laterality Date  COLONOSCOPY WITH PROPOFOL N/A 06/25/2021   Procedure: COLONOSCOPY WITH PROPOFOL;  Surgeon: Tressia Danas, MD;  Location: [facility];  Service: Gastroenterology;  Laterality: N/A;   CYSTOSCOPY     FINGER SURGERY     POLYPECTOMY  06/25/2021   Procedure: POLYPECTOMY;  Surgeon: Tressia Danas, MD;  Location: [facility];  Service: Gastroenterology;;   POSTERIOR CERVICAL FUSION/FORAMINOTOMY N/A 11/22/2015   Procedure: TLIF L5-S1;  Surgeon: Venita Lick, MD;  Location: [facility];  Service: Orthopedics;  Laterality: N/A;   SHOULDER SURGERY Right 03/2018   Dr. Ranell Patrick    Family History  Problem Relation Age of Onset   High blood pressure Father    Diabetes Father    Arthritis Father    Diabetes Paternal Grandmother     Heart disease Other        No family history   Colon cancer Neg Hx    Esophageal cancer Neg Hx    Colon polyps Neg Hx    Rectal cancer Neg Hx    Stomach cancer Neg Hx       Current Outpatient Medications on File Prior to Visit  Medication Sig Dispense Refill   amitriptyline (ELAVIL) 50 MG tablet Take 1 tablet (50 mg total) by mouth at bedtime. 90 tablet 3   amLODipine (NORVASC) 5 MG tablet TAKE 1 TABLET(5 MG) BY MOUTH DAILY 90 tablet 3   atorvastatin (LIPITOR) 40 MG tablet TAKE 1 TABLET(40 MG) BY MOUTH DAILY 30 tablet 11   Blood Glucose Monitoring Suppl (ONE TOUCH ULTRA 2) w/Device KIT 1 kit by Does not apply route in the morning, at noon, and at bedtime. 1 kit 0   cetirizine (ZYRTEC) 10 MG tablet Take 10 mg by mouth in the morning.     gabapentin (NEURONTIN) 800 MG tablet Take 800 mg by mouth 3 (three) times daily.  2   glucose blood test strip Use as instructed 300 each 6   HYDROcodone-acetaminophen (NORCO/VICODIN) 5-325 MG tablet Take 1 tablet by mouth in the morning, at noon, and at bedtime.  0   hydrOXYzine (ATARAX) 10 MG tablet TAKE 1 TABLET(10 MG) BY MOUTH THREE TIMES DAILY AS NEEDED 90 tablet 2   ibuprofen (ADVIL) 200 MG tablet Take 800 mg by mouth 2 (two) times daily as needed for moderate pain.     Lancets (ONETOUCH ULTRASOFT) lancets Use as instructed 300 each 6   lisinopril-hydrochlorothiazide (ZESTORETIC) 20-25 MG tablet TAKE 1 TABLET BY MOUTH DAILY 30 tablet 11   metFORMIN (GLUCOPHAGE) 1000 MG tablet TAKE 1 TABLET(1000 MG) BY MOUTH TWICE DAILY WITH A MEAL 180 tablet 0   PARoxetine (PAXIL) 40 MG tablet Take 1 tablet (40 mg total) by mouth daily at 6 (six) AM. 90 tablet 0   promethazine (PHENERGAN) 25 MG tablet TAKE 1 TABLET BY MOUTH EVERY 8 HOURS AS NEEDED 30 tablet 1   SUMAtriptan (IMITREX) 100 MG tablet TAKE 1 TABLET(100 MG) BY MOUTH EVERY 2 HOURS AS NEEDED FOR MIGRAINE. MAY REPEAT IN 2 HOURS IF HEADACHE PERSISTS OR RECURS 15 tablet 3   No current facility-administered  medications on file prior to visit.    BP 120/80   Pulse (!) 105   Temp 98.1 F (36.7 C) (Oral)   Ht 5\' 10"  (1.778 m)   Wt 169 lb (76.7 kg)   SpO2 96%   BMI 24.25 kg/m       Objective:   Physical Exam Vitals and nursing note reviewed.  Constitutional:      Appearance: Normal appearance. He  is ill-appearing.  Cardiovascular:     Rate and Rhythm: Tachycardia present.     Pulses: Normal pulses.     Heart sounds: Normal heart sounds.  Pulmonary:     Effort: Pulmonary effort is normal.     Breath sounds: Normal breath sounds.  Abdominal:     General: Abdomen is flat. Bowel sounds are increased.     Palpations: Abdomen is soft. There is no mass.     Tenderness: There is no abdominal tenderness.     Hernia: No hernia is present.  Genitourinary:    Rectum: Normal. Guaiac result negative.  Musculoskeletal:        General: Normal range of motion.  Skin:    General: Skin is warm and dry.  Neurological:     General: No focal deficit present.     Mental Status: He is alert and oriented to person, place, and time.  Psychiatric:        Mood and Affect: Mood normal.        Behavior: Behavior normal.        Thought Content: Thought content normal.        Judgment: Judgment normal.           Assessment & Plan:  1. Viral gastroenteritis -40 mg  IM Zofran given in the office today.  He was encouraged to drink water and no sugar Gatorade or Powerade and eat a bland diet for the next 48 to 72 hours.  Will send in prescription for Zofran.  Advise follow-up if not improving in the next 24 to 36 hours - ondansetron (ZOFRAN) 8 MG tablet; Take 1 tablet (8 mg total) by mouth every 8 (eight) hours as needed for nausea or vomiting.  Dispense: 20 tablet; Refill: 0 - ondansetron (ZOFRAN) injection 4 mg  2. Diarrhea, unspecified type - Can take Pepto as needed - POC COVID-19- negative   3. Fatigue, unspecified type  - POC COVID-19  Shirline Frees, NP  Time spent with patient today  was 32 minutes which consisted of chart review, discussing viral gastroenteritis, work up, treatment answering questions and documentation.

## 2023-07-23 NOTE — Patient Instructions (Signed)
Health Maintenance Due  Topic Date Due   HIV Screening  Never done   Diabetic kidney evaluation - Urine ACR  Never done   Hepatitis C Screening  Never done   COVID-19 Vaccine (3 - 2023-24 season) 08/09/2022   Zoster Vaccines- Shingrix (1 of 2) Never done   INFLUENZA VACCINE  07/10/2023       11/26/2022    2:54 PM 09/06/2022    7:54 AM 09/06/2022    7:26 AM  Depression screen PHQ 2/9  Decreased Interest 1 1 1   Down, Depressed, Hopeless 1 0 0  PHQ - 2 Score 2 1 1   Altered sleeping 0 0 0  Tired, decreased energy 2 1 1   Change in appetite 0 0 0  Feeling bad or failure about yourself  0 0   Trouble concentrating 2 2 2   Moving slowly or fidgety/restless 0 0 0  Suicidal thoughts 0 0 0  PHQ-9 Score 6 4 4   Difficult doing work/chores Somewhat difficult Not difficult at all

## 2023-07-25 ENCOUNTER — Telehealth: Payer: Self-pay | Admitting: Adult Health

## 2023-07-25 NOTE — Telephone Encounter (Signed)
Prescription Request  07/25/2023  LOV: 07/23/2023  Requested medication:  hydrOXYzine (ATARAX) 10 MG tablet  metFORMIN (GLUCOPHAGE) 1000 MG tablet  Have you contacted your pharmacy to request a refill? No   Which pharmacy would you like this sent to?  [id number] [place], [place] - [address] AT [facility] OF [facility] [address] [place] [address] Phone: [phone] Fax: [phone]    Patient notified that their request is being sent to the clinical staff for review and that they should receive a response within 2 business days.   Please advise at Mobile [phone] (mobile)

## 2023-07-29 ENCOUNTER — Other Ambulatory Visit: Payer: Self-pay

## 2023-07-29 DIAGNOSIS — F411 Generalized anxiety disorder: Secondary | ICD-10-CM

## 2023-07-29 DIAGNOSIS — E1165 Type 2 diabetes mellitus with hyperglycemia: Secondary | ICD-10-CM

## 2023-07-29 MED ORDER — METFORMIN HCL 1000 MG PO TABS
1000.0000 mg | ORAL_TABLET | Freq: Two times a day (BID) | ORAL | 0 refills | Status: DC
Start: 2023-07-29 — End: 2023-10-28

## 2023-07-29 MED ORDER — HYDROXYZINE HCL 10 MG PO TABS
10.0000 mg | ORAL_TABLET | Freq: Three times a day (TID) | ORAL | 2 refills | Status: DC | PRN
Start: 2023-07-29 — End: 2023-12-26

## 2023-07-29 NOTE — Telephone Encounter (Signed)
Rx has been refilled.  

## 2023-07-31 ENCOUNTER — Encounter: Payer: Self-pay | Admitting: Adult Health

## 2023-07-31 ENCOUNTER — Ambulatory Visit: Payer: BC Managed Care – PPO | Admitting: Adult Health

## 2023-07-31 VITALS — BP 138/86 | HR 81 | Temp 98.5°F | Ht 70.0 in | Wt 180.0 lb

## 2023-07-31 DIAGNOSIS — M25511 Pain in right shoulder: Secondary | ICD-10-CM | POA: Diagnosis not present

## 2023-07-31 DIAGNOSIS — I10 Essential (primary) hypertension: Secondary | ICD-10-CM

## 2023-07-31 DIAGNOSIS — Z7984 Long term (current) use of oral hypoglycemic drugs: Secondary | ICD-10-CM

## 2023-07-31 DIAGNOSIS — E119 Type 2 diabetes mellitus without complications: Secondary | ICD-10-CM | POA: Diagnosis not present

## 2023-07-31 LAB — POCT GLYCOSYLATED HEMOGLOBIN (HGB A1C): Hemoglobin A1C: 5.8 % — AB (ref 4.0–5.6)

## 2023-07-31 MED ORDER — METHYLPREDNISOLONE ACETATE 80 MG/ML IJ SUSP
80.0000 mg | Freq: Once | INTRAMUSCULAR | Status: AC
Start: 2023-07-31 — End: 2023-07-31
  Administered 2023-07-31: 80 mg via INTRA_ARTICULAR

## 2023-07-31 NOTE — Progress Notes (Signed)
Subjective:    Patient ID: Dustin Henry, male    DOB: [birth year], 52 y.o.   MRN: [id number]  Diabetes   52 year old male who  has a past medical history of ALLERGIC RHINITIS (08/18/2007), Allergy, Anxiety, BACK PAIN, Depression, Diabetes mellitus without complication (HCC), Difficult intubation, History of kidney stones, HYPERLIPIDEMIA (02/12/2010), HYPERTENSION, IBS (irritable bowel syndrome), MIGRAINE HEADACHE, Neuromuscular disorder (HCC), and Nocturia.  He presents to the office today for follow up regarding DM and HTN   DM Type 2 -currently maintained with metformin 1000 mg twice daily.  He has been taking his medications as directed.  He is eating healthier and drinking more water throughout the day. He does not check his blood sugars at home but has the [place] system. His last A1c was 6.4   Hypertension-managed with Norvasc 5 mg daily and lisinopril/hydrochlorothiazide 20-25 mg daily.  He denies dizziness, lightheadedness, chest pain, or shortness of breath BP Readings from Last 3 Encounters:  07/31/23 138/86  07/23/23 120/80  05/08/23 136/78   Right shoulder pain - has been present for about a month. He believes he injured his shoulder picking something up. Has had loss of ROM and pretty constant pain since. OTC treatments have not really helped.   Review of Systems See HPI   Past Medical History:  Diagnosis Date   ALLERGIC RHINITIS 08/18/2007   Allergy    seasonal   Anxiety    BACK PAIN    herniated disc   Depression    takes Paxil daily   Diabetes mellitus without complication (HCC)    Difficult intubation    09/05/10: difficult airway documented (ant cords vis with head up/cricoid press), MAC 4 used   History of kidney stones    HYPERLIPIDEMIA 02/12/2010   HYPERTENSION    takes Amlodipine and Lisinopril-HCTZ daily   IBS (irritable bowel syndrome)    MIGRAINE HEADACHE    takes Imitrex daily as needed   Neuromuscular disorder (HCC)    nerve pain left leg  post op back surgery   Nocturia     Social History   Socioeconomic History   Marital status: Married    Spouse name: Not on file   Number of children: 1   Years of education: Not on file   Highest education level: Not on file  Occupational History   Occupation: pressman helper    Comment: Presenter, broadcasting  Tobacco Use   Smoking status: Former    Current packs/day: 3.00    Average packs/day: 3.0 packs/day for 10.0 years (30.0 ttl pk-yrs)    Types: Cigarettes   Smokeless tobacco: Never   Tobacco comments:    quit smoking about 56yrs ago  Substance and Sexual Activity   Alcohol use: Yes    Comment: occasionally   Drug use: No   Sexual activity: Not on file  Other Topics Concern   Not on file  Social History Narrative   Not on file   Social Determinants of Health   Financial Resource Strain: Not on file  Food Insecurity: Not on file  Transportation Needs: Not on file  Physical Activity: Not on file  Stress: Not on file  Social Connections: Not on file  Intimate Partner Violence: Not on file    Past Surgical History:  Procedure Laterality Date   COLONOSCOPY WITH PROPOFOL N/A 06/25/2021   Procedure: COLONOSCOPY WITH PROPOFOL;  Surgeon: Tressia Danas, MD;  Location: [facility];  Service: Gastroenterology;  Laterality: N/A;   CYSTOSCOPY  FINGER SURGERY     POLYPECTOMY  06/25/2021   Procedure: POLYPECTOMY;  Surgeon: Tressia Danas, MD;  Location: [facility];  Service: Gastroenterology;;   POSTERIOR CERVICAL FUSION/FORAMINOTOMY N/A 11/22/2015   Procedure: TLIF L5-S1;  Surgeon: Venita Lick, MD;  Location: [facility];  Service: Orthopedics;  Laterality: N/A;   SHOULDER SURGERY Right 03/2018   Dr. Ranell Patrick    Family History  Problem Relation Age of Onset   High blood pressure Father    Diabetes Father    Arthritis Father    Diabetes Paternal Grandmother    Heart disease Other        No family history   Colon cancer Neg Hx    Esophageal cancer Neg Hx    Colon  polyps Neg Hx    Rectal cancer Neg Hx    Stomach cancer Neg Hx       Current Outpatient Medications on File Prior to Visit  Medication Sig Dispense Refill   amitriptyline (ELAVIL) 50 MG tablet Take 1 tablet (50 mg total) by mouth at bedtime. 90 tablet 3   amLODipine (NORVASC) 5 MG tablet TAKE 1 TABLET(5 MG) BY MOUTH DAILY 90 tablet 3   atorvastatin (LIPITOR) 40 MG tablet TAKE 1 TABLET(40 MG) BY MOUTH DAILY 30 tablet 11   Blood Glucose Monitoring Suppl (ONE TOUCH ULTRA 2) w/Device KIT 1 kit by Does not apply route in the morning, at noon, and at bedtime. 1 kit 0   cetirizine (ZYRTEC) 10 MG tablet Take 10 mg by mouth in the morning.     gabapentin (NEURONTIN) 800 MG tablet Take 800 mg by mouth 3 (three) times daily.  2   glucose blood test strip Use as instructed 300 each 6   HYDROcodone-acetaminophen (NORCO/VICODIN) 5-325 MG tablet Take 1 tablet by mouth in the morning, at noon, and at bedtime.  0   hydrOXYzine (ATARAX) 10 MG tablet Take 1 tablet (10 mg total) by mouth 3 (three) times daily as needed. TAKE 1 TABLET(10 MG) BY MOUTH THREE TIMES DAILY AS NEEDED 90 tablet 2   ibuprofen (ADVIL) 200 MG tablet Take 800 mg by mouth 2 (two) times daily as needed for moderate pain.     Lancets (ONETOUCH ULTRASOFT) lancets Use as instructed 300 each 6   lisinopril-hydrochlorothiazide (ZESTORETIC) 20-25 MG tablet TAKE 1 TABLET BY MOUTH DAILY 30 tablet 11   metFORMIN (GLUCOPHAGE) 1000 MG tablet Take 1 tablet (1,000 mg total) by mouth 2 (two) times daily with a meal. TAKE 1 TABLET(1000 MG) BY MOUTH TWICE DAILY WITH A MEAL 180 tablet 0   ondansetron (ZOFRAN) 8 MG tablet Take 1 tablet (8 mg total) by mouth every 8 (eight) hours as needed for nausea or vomiting. 20 tablet 0   PARoxetine (PAXIL) 40 MG tablet Take 1 tablet (40 mg total) by mouth daily at 6 (six) AM. 90 tablet 0   promethazine (PHENERGAN) 25 MG tablet TAKE 1 TABLET BY MOUTH EVERY 8 HOURS AS NEEDED 30 tablet 1   SUMAtriptan (IMITREX) 100 MG  tablet TAKE 1 TABLET(100 MG) BY MOUTH EVERY 2 HOURS AS NEEDED FOR MIGRAINE. MAY REPEAT IN 2 HOURS IF HEADACHE PERSISTS OR RECURS 15 tablet 3   No current facility-administered medications on file prior to visit.    BP 138/86   Pulse 81   Temp 98.5 F (36.9 C) (Oral)   Ht 5\' 10"  (1.778 m)   Wt 180 lb (81.6 kg)   SpO2 96%   BMI 25.83 kg/m  Objective:   Physical Exam Vitals and nursing note reviewed.  Constitutional:      Appearance: Normal appearance.  Cardiovascular:     Rate and Rhythm: Normal rate and regular rhythm.     Pulses: Normal pulses.     Heart sounds: Normal heart sounds.  Pulmonary:     Effort: Pulmonary effort is normal.     Breath sounds: Normal breath sounds.  Musculoskeletal:     Right shoulder: Tenderness present. No crepitus. Decreased range of motion. Normal strength.     Comments: Can raise arm above 90 degrees but has pain with doing so. Unable to perform back scratch test and has pain with empty can test.   Skin:    General: Skin is warm and dry.  Neurological:     General: No focal deficit present.     Mental Status: He is alert and oriented to person, place, and time.  Psychiatric:        Mood and Affect: Mood normal.        Behavior: Behavior normal.        Thought Content: Thought content normal.        Judgment: Judgment normal.        Assessment & Plan:  1. Diabetes mellitus treated with oral medication (HCC)  - POC HgB A1c- 5.8  - He is doing well. Will recheck at CPE this fall and consider decreasing Metformin   2. Essential hypertension - Controlled. No change in medication   3. Acute pain of right shoulder - Likely impingement but canno r/o rotator cuff injury at this this time. He opted for steroid injection. If no better in a week or two can order xray.  Shoulder injection Verbal consent obtained and verified. Sterile betadine prep. Furthur cleansed with alcohol. Topical analgesic spray: Ethyl chloride. Joint: right  subacromial injection Approached in typical fashion with: posterior approach Completed without difficulty Meds: 3 cc lidocaine 2% no epi, 1 cc depomedrol 80mg /cc Needle:1.5 inch 25 gauge Aftercare instructions and Red flags advised. Immediate improvement in pain noted  - methylPREDNISolone acetate (DEPO-MEDROL) injection 80 mg  Shirline Frees, NP

## 2023-07-31 NOTE — Patient Instructions (Signed)
 Health Maintenance Due  Topic Date Due   HIV Screening  Never done   Diabetic kidney evaluation - Urine ACR  Never done   Hepatitis C Screening  Never done   COVID-19 Vaccine (3 - 2023-24 season) 08/09/2022   Zoster Vaccines- Shingrix (1 of 2) Never done   INFLUENZA VACCINE  07/10/2023       11/26/2022    2:54 PM 09/06/2022    7:54 AM 09/06/2022    7:26 AM  Depression screen PHQ 2/9  Decreased Interest 1 1 1   Down, Depressed, Hopeless 1 0 0  PHQ - 2 Score 2 1 1   Altered sleeping 0 0 0  Tired, decreased energy 2 1 1   Change in appetite 0 0 0  Feeling bad or failure about yourself  0 0   Trouble concentrating 2 2 2   Moving slowly or fidgety/restless 0 0 0  Suicidal thoughts 0 0 0  PHQ-9 Score 6 4 4   Difficult doing work/chores Somewhat difficult Not difficult at all

## 2023-09-17 ENCOUNTER — Other Ambulatory Visit: Payer: Self-pay | Admitting: Adult Health

## 2023-09-17 DIAGNOSIS — F411 Generalized anxiety disorder: Secondary | ICD-10-CM

## 2023-10-26 ENCOUNTER — Other Ambulatory Visit: Payer: Self-pay | Admitting: Adult Health

## 2023-10-26 DIAGNOSIS — E1165 Type 2 diabetes mellitus with hyperglycemia: Secondary | ICD-10-CM

## 2023-11-05 ENCOUNTER — Ambulatory Visit (INDEPENDENT_AMBULATORY_CARE_PROVIDER_SITE_OTHER): Payer: BC Managed Care – PPO | Admitting: Adult Health

## 2023-11-05 ENCOUNTER — Encounter: Payer: Self-pay | Admitting: Adult Health

## 2023-11-05 VITALS — BP 120/70 | HR 95 | Temp 98.7°F | Ht 69.0 in | Wt 175.0 lb

## 2023-11-05 DIAGNOSIS — Z0000 Encounter for general adult medical examination without abnormal findings: Secondary | ICD-10-CM | POA: Diagnosis not present

## 2023-11-05 DIAGNOSIS — F32A Depression, unspecified: Secondary | ICD-10-CM

## 2023-11-05 DIAGNOSIS — Z125 Encounter for screening for malignant neoplasm of prostate: Secondary | ICD-10-CM | POA: Diagnosis not present

## 2023-11-05 DIAGNOSIS — I10 Essential (primary) hypertension: Secondary | ICD-10-CM

## 2023-11-05 DIAGNOSIS — G43009 Migraine without aura, not intractable, without status migrainosus: Secondary | ICD-10-CM

## 2023-11-05 DIAGNOSIS — E785 Hyperlipidemia, unspecified: Secondary | ICD-10-CM | POA: Diagnosis not present

## 2023-11-05 DIAGNOSIS — E119 Type 2 diabetes mellitus without complications: Secondary | ICD-10-CM | POA: Diagnosis not present

## 2023-11-05 DIAGNOSIS — Z7984 Long term (current) use of oral hypoglycemic drugs: Secondary | ICD-10-CM

## 2023-11-05 DIAGNOSIS — Z114 Encounter for screening for human immunodeficiency virus [HIV]: Secondary | ICD-10-CM

## 2023-11-05 DIAGNOSIS — G8929 Other chronic pain: Secondary | ICD-10-CM

## 2023-11-05 DIAGNOSIS — M545 Low back pain, unspecified: Secondary | ICD-10-CM

## 2023-11-05 DIAGNOSIS — Z1159 Encounter for screening for other viral diseases: Secondary | ICD-10-CM

## 2023-11-05 DIAGNOSIS — F419 Anxiety disorder, unspecified: Secondary | ICD-10-CM

## 2023-11-05 LAB — COMPREHENSIVE METABOLIC PANEL
ALT: 27 U/L (ref 0–53)
AST: 18 U/L (ref 0–37)
Albumin: 4.8 g/dL (ref 3.5–5.2)
Alkaline Phosphatase: 73 U/L (ref 39–117)
BUN: 14 mg/dL (ref 6–23)
CO2: 30 meq/L (ref 19–32)
Calcium: 10.1 mg/dL (ref 8.4–10.5)
Chloride: 99 meq/L (ref 96–112)
Creatinine, Ser: 0.96 mg/dL (ref 0.40–1.50)
GFR: 92 mL/min (ref >60.00)
Glucose, Bld: 142 mg/dL — ABNORMAL HIGH (ref 70–99)
Potassium: 3.6 meq/L (ref 3.5–5.1)
Sodium: 139 meq/L (ref 135–145)
Total Bilirubin: 0.5 mg/dL (ref 0.2–1.2)
Total Protein: 7 g/dL (ref 6.0–8.3)

## 2023-11-05 LAB — CBC
HCT: 43.5 % (ref 39.0–52.0)
Hemoglobin: 14.8 g/dL (ref 13.0–17.0)
MCHC: 34.1 g/dL (ref 30.0–36.0)
MCV: 89.5 fL (ref 78.0–100.0)
Platelets: 340 10*3/uL (ref 150.0–400.0)
RBC: 4.86 Mil/uL (ref 4.22–5.81)
RDW: 12.8 % (ref 11.5–15.5)
WBC: 4.7 10*3/uL (ref 4.0–10.5)

## 2023-11-05 LAB — LIPID PANEL
Cholesterol: 143 mg/dL (ref 0–200)
HDL: 37.6 mg/dL — ABNORMAL LOW (ref >39.00)
LDL Cholesterol: 78 mg/dL (ref 0–99)
NonHDL: 105.37
Total CHOL/HDL Ratio: 4
Triglycerides: 139 mg/dL (ref 0.0–149.0)
VLDL: 27.8 mg/dL (ref 0.0–40.0)

## 2023-11-05 LAB — HEMOGLOBIN A1C: Hgb A1c MFr Bld: 6.3 % (ref 4.6–6.5)

## 2023-11-05 LAB — MICROALBUMIN / CREATININE URINE RATIO
Creatinine,U: 85.1 mg/dL
Microalb Creat Ratio: 0.8 mg/g (ref 0.0–30.0)
Microalb, Ur: <0.7 mg/dL (ref 0.0–1.9)

## 2023-11-05 LAB — PSA: PSA: 3.29 ng/mL (ref 0.10–4.00)

## 2023-11-05 LAB — TSH: TSH: 1.02 u[IU]/mL (ref 0.35–5.50)

## 2023-11-05 MED ORDER — SUMATRIPTAN SUCCINATE 100 MG PO TABS
100.0000 mg | ORAL_TABLET | ORAL | 3 refills | Status: AC | PRN
Start: 2023-11-05 — End: ?

## 2023-11-05 NOTE — Patient Instructions (Signed)
It was great seeing you today   We will follow up with you regarding your lab work   Please let me know if you need anything   

## 2023-11-05 NOTE — Progress Notes (Signed)
Subjective:    Patient ID: Dustin Henry, male    DOB: 1970-12-13, 52 y.o.   MRN: [id number]  HPI Patient presents for yearly preventative medicine examination. He is a pleasant 51 year old male who  has a past medical history of ALLERGIC RHINITIS (08/18/2007), Allergy, Anxiety, BACK PAIN, Depression, Diabetes mellitus without complication (HCC), Difficult intubation, History of kidney stones, HYPERLIPIDEMIA (02/12/2010), HYPERTENSION, IBS (irritable bowel syndrome), MIGRAINE HEADACHE, Neuromuscular disorder (HCC), and Nocturia.  DM Type 2 -currently maintained with metformin 1000 mg twice daily.  He has been taking his medications as directed.  He is eating healthier and drinking more water throughout the day. He does not check his blood sugars at home but has the [place] system. Lab Results  Component Value Date   HGBA1C 5.8 (A) 07/31/2023   HGBA1C 6.4 09/06/2022   HGBA1C 6.4 (A) 06/27/2022   Hypertension-managed with  lisinopril/hydrochlorothiazide 20-25 mg daily.  He denies dizziness, lightheadedness, chest pain, or shortness of breath BP Readings from Last 3 Encounters:  11/05/23 120/70  07/31/23 138/86  07/23/23 120/80   Hyperlipidemia-managed with Lipitor 40 mg daily.  He denies myalgia or fatigue Lab Results  Component Value Date   CHOL 164 09/06/2022   HDL 35.00 (L) 09/06/2022   LDLDIRECT 98.0 09/06/2022   TRIG 237.0 (H) 09/06/2022   CHOLHDL 5 09/06/2022   Anxiety and depression-managed with Paxil 40 mg daily and Atarax 10 mg QHS. He feels as though his anxiety and depression are not controlled. He has plans to talk to a therapist after the first of the year. He does not want to see a psychiatrist just yet.   Migraine headaches-takes Imitrex as needed and Elavil 25 mg QHS. Has very few migraine headaches   Chronic back and shoulder pain-seen by orthopedics and is managed gabapentin and Norco.  All immunizations and health maintenance protocols were reviewed with the  patient and needed orders were placed.  Appropriate screening laboratory values were ordered for the patient including screening of hyperlipidemia, renal function and hepatic function. If indicated by BPH, a PSA was ordered.  Medication reconciliation,  past medical history, social history, problem list and allergies were reviewed in detail with the patient  Goals were established with regard to weight loss, exercise, and  diet in compliance with medications. 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   He is up to date on routine colon cancer screening   Review of Systems  Constitutional: Negative.   HENT: Negative.    Eyes: Negative.   Respiratory: Negative.    Cardiovascular: Negative.   Gastrointestinal: Negative.   Endocrine: Negative.   Genitourinary: Negative.   Musculoskeletal:  Positive for arthralgias and back pain.  Skin: Negative.   Allergic/Immunologic: Negative.   Neurological: Negative.   Hematological: Negative.   Psychiatric/Behavioral:  Positive for decreased concentration and dysphoric mood. The patient is nervous/anxious.   All other systems reviewed and are negative.  Past Medical History:  Diagnosis Date   ALLERGIC RHINITIS 08/18/2007   Allergy    seasonal   Anxiety    BACK PAIN    herniated disc   Depression    takes Paxil daily   Diabetes mellitus without complication (HCC)    Difficult intubation    09/05/10: difficult airway documented (ant cords vis with head up/cricoid press), MAC 4 used   History of kidney stones    HYPERLIPIDEMIA 02/12/2010   HYPERTENSION    takes  Amlodipine and Lisinopril-HCTZ daily   IBS (irritable bowel syndrome)    MIGRAINE HEADACHE    takes Imitrex daily as needed   Neuromuscular disorder (HCC)    nerve pain left leg post op back surgery   Nocturia     Social History   Socioeconomic History   Marital status: Married    Spouse name: Not on file   Number of children:  1   Years of education: Not on file   Highest education level: Not on file  Occupational History   Occupation: pressman helper    Comment: Presenter, broadcasting  Tobacco Use   Smoking status: Former    Current packs/day: 3.00    Average packs/day: 3.0 packs/day for 10.0 years (30.0 ttl pk-yrs)    Types: Cigarettes   Smokeless tobacco: Never   Tobacco comments:    quit smoking about 10yrs ago  Substance and Sexual Activity   Alcohol use: Yes    Comment: occasionally   Drug use: No   Sexual activity: Not on file  Other Topics Concern   Not on file  Social History Narrative   Not on file   Social Determinants of Health   Financial Resource Strain: Not on file  Food Insecurity: Not on file  Transportation Needs: Not on file  Physical Activity: Not on file  Stress: Not on file  Social Connections: Not on file  Intimate Partner Violence: Not on file    Past Surgical History:  Procedure Laterality Date   COLONOSCOPY WITH PROPOFOL N/A 06/25/2021   Procedure: COLONOSCOPY WITH PROPOFOL;  Surgeon: Tressia Danas, MD;  Location: [facility];  Service: Gastroenterology;  Laterality: N/A;   CYSTOSCOPY     FINGER SURGERY     POLYPECTOMY  06/25/2021   Procedure: POLYPECTOMY;  Surgeon: Tressia Danas, MD;  Location: [facility];  Service: Gastroenterology;;   POSTERIOR CERVICAL FUSION/FORAMINOTOMY N/A 11/22/2015   Procedure: TLIF L5-S1;  Surgeon: Venita Lick, MD;  Location: [facility];  Service: Orthopedics;  Laterality: N/A;   SHOULDER SURGERY Right 03/2018   Dr. Ranell Patrick    Family History  Problem Relation Age of Onset   High blood pressure Father    Diabetes Father    Arthritis Father    Diabetes Paternal Grandmother    Heart disease Other        No family history   Colon cancer Neg Hx    Esophageal cancer Neg Hx    Colon polyps Neg Hx    Rectal cancer Neg Hx    Stomach cancer Neg Hx       Current Outpatient Medications on File Prior to Visit  Medication Sig Dispense  Refill   amitriptyline (ELAVIL) 50 MG tablet Take 1 tablet (50 mg total) by mouth at bedtime. 90 tablet 3   atorvastatin (LIPITOR) 40 MG tablet TAKE 1 TABLET(40 MG) BY MOUTH DAILY 30 tablet 11   cetirizine (ZYRTEC) 10 MG tablet Take 10 mg by mouth in the morning.     gabapentin (NEURONTIN) 800 MG tablet Take 800 mg by mouth 3 (three) times daily.  2   glucose blood test strip Use as instructed 300 each 6   hydrOXYzine (ATARAX) 10 MG tablet Take 1 tablet (10 mg total) by mouth 3 (three) times daily as needed. TAKE 1 TABLET(10 MG) BY MOUTH THREE TIMES DAILY AS NEEDED 90 tablet 2   ibuprofen (ADVIL) 200 MG tablet Take 800 mg by mouth 2 (two) times daily as needed for moderate pain.  Lancets (ONETOUCH ULTRASOFT) lancets Use as instructed 300 each 6   lisinopril-hydrochlorothiazide (ZESTORETIC) 20-25 MG tablet TAKE 1 TABLET BY MOUTH DAILY 30 tablet 11   metFORMIN (GLUCOPHAGE) 1000 MG tablet TAKE 1 TABLET(1000 MG) BY MOUTH TWICE DAILY WITH A MEAL 180 tablet 0   ondansetron (ZOFRAN) 8 MG tablet Take 1 tablet (8 mg total) by mouth every 8 (eight) hours as needed for nausea or vomiting. 20 tablet 0   PARoxetine (PAXIL) 40 MG tablet TAKE 1 TABLET(40 MG) BY MOUTH DAILY 90 tablet 0   promethazine (PHENERGAN) 25 MG tablet TAKE 1 TABLET BY MOUTH EVERY 8 HOURS AS NEEDED 30 tablet 1   SUMAtriptan (IMITREX) 100 MG tablet TAKE 1 TABLET(100 MG) BY MOUTH EVERY 2 HOURS AS NEEDED FOR MIGRAINE. MAY REPEAT IN 2 HOURS IF HEADACHE PERSISTS OR RECURS 15 tablet 3   amLODipine (NORVASC) 5 MG tablet TAKE 1 TABLET(5 MG) BY MOUTH DAILY (Patient not taking: Reported on 11/05/2023) 90 tablet 3   Blood Glucose Monitoring Suppl (ONE TOUCH ULTRA 2) w/Device KIT 1 kit by Does not apply route in the morning, at noon, and at bedtime. (Patient not taking: Reported on 11/05/2023) 1 kit 0   No current facility-administered medications on file prior to visit.    BP 120/70   Pulse 95   Temp 98.7 F (37.1 C) (Oral)   Ht 5\' 9"  (1.753  m)   Wt 175 lb (79.4 kg)   SpO2 96%   BMI 25.84 kg/m       Objective:   Physical Exam Vitals and nursing note reviewed.  Constitutional:      General: He is not in acute distress.    Appearance: Normal appearance. He is not ill-appearing.  HENT:     Head: Normocephalic and atraumatic.     Right Ear: Tympanic membrane, ear canal and external ear normal. There is no impacted cerumen.     Left Ear: Tympanic membrane, ear canal and external ear normal. There is no impacted cerumen.     Nose: Nose normal. No congestion or rhinorrhea.     Mouth/Throat:     Mouth: Mucous membranes are moist.     Pharynx: Oropharynx is clear.  Eyes:     Extraocular Movements: Extraocular movements intact.     Conjunctiva/sclera: Conjunctivae normal.     Pupils: Pupils are equal, round, and reactive to light.  Neck:     Vascular: No carotid bruit.  Cardiovascular:     Rate and Rhythm: Normal rate and regular rhythm.     Pulses: Normal pulses.     Heart sounds: No murmur heard.    No friction rub. No gallop.  Pulmonary:     Effort: Pulmonary effort is normal.     Breath sounds: Normal breath sounds.  Abdominal:     General: Abdomen is flat. Bowel sounds are normal. There is no distension.     Palpations: Abdomen is soft. There is no mass.     Tenderness: There is no abdominal tenderness. There is no guarding or rebound.     Hernia: No hernia is present.  Musculoskeletal:        General: Normal range of motion.     Cervical back: Normal range of motion and neck supple.  Lymphadenopathy:     Cervical: No cervical adenopathy.  Skin:    General: Skin is warm and dry.     Capillary Refill: Capillary refill takes less than 2 seconds.  Neurological:     General: No  focal deficit present.     Mental Status: He is alert and oriented to person, place, and time.  Psychiatric:        Mood and Affect: Mood normal.        Behavior: Behavior normal.        Thought Content: Thought content normal.         Judgment: Judgment normal.        Assessment & Plan:  1. Routine general medical examination at a health care facility Today patient counseled on age appropriate routine health concerns for screening and prevention, each reviewed and up to date or declined. Immunizations reviewed and up to date or declined. Labs ordered and reviewed. Risk factors for depression reviewed and negative. Hearing function and visual acuity are intact. ADLs screened and addressed as needed. Functional ability and level of safety reviewed and appropriate. Education, counseling and referrals performed based on assessed risks today. Patient provided with a copy of personalized plan for preventive services. - Refused flu and shingles vaccination  - Encouraged to increase aerobic exercise - Follow up in one year or for CPE   2. Diabetes mellitus treated with oral medication (HCC) - Consider adding agent - encouraged to wear libre  - Follow up in three months - Lipid panel; Future - TSH; Future - CBC; Future - Comprehensive metabolic panel; Future - Hemoglobin A1c; Future - Microalbumin/Creatinine Ratio, Urine; Future  3. Essential hypertension - Well controlled. No change in medication  - Lipid panel; Future - TSH; Future - CBC; Future - Comprehensive metabolic panel; Future  4. Dyslipidemia - Consider increase in statin  - Lipid panel; Future - TSH; Future - CBC; Future - Comprehensive metabolic panel; Future  5. Anxiety and depression - Continue with Paxil and Atarax - Encouraged therapy     11/05/2023    7:37 AM 11/26/2022    2:54 PM 09/06/2022    7:54 AM  PHQ9 SCORE ONLY  PHQ-9 Total Score 8 6 4       11/05/2023    7:37 AM  GAD 7 : Generalized Anxiety Score  Nervous, Anxious, on Edge 2  Control/stop worrying 2  Worry too much - different things 2  Trouble relaxing 2  Restless 0  Easily annoyed or irritable 2  Afraid - awful might happen 0  Total GAD 7 Score 10  Anxiety Difficulty  Somewhat difficult      6. Migraine without aura and without status migrainosus, not intractable  - Lipid panel; Future - TSH; Future - CBC; Future - Comprehensive metabolic panel; Future - SUMAtriptan (IMITREX) 100 MG tablet; Take 1 tablet (100 mg total) by mouth every 2 (two) hours as needed for migraine. May repeat in 2 hours if headache persists or recurs.TAKE 1 TABLET(100 MG) BY MOUTH EVERY 2 HOURS AS NEEDED FOR MIGRAINE. MAY REPEAT IN 2 HOURS IF HEADACHE PERSISTS OR RECURS  Dispense: 15 tablet; Refill: 3  7. Chronic low back pain without sciatica, unspecified back pain laterality  - Lipid panel; Future - TSH; Future - CBC; Future - Comprehensive metabolic panel; Future  8. Encounter for screening for HIV  - HIV Antibody (routine testing w rflx); Future  9. Need for hepatitis C screening test  - Hep C Antibody  10. Prostate cancer screening  - PSA; Future  Shirline Frees, NP

## 2023-11-07 LAB — HEPATITIS C ANTIBODY: Hepatitis C Ab: NONREACTIVE

## 2023-11-07 LAB — HIV ANTIBODY (ROUTINE TESTING W REFLEX): HIV 1&2 Ab, 4th Generation: NONREACTIVE

## 2023-12-25 ENCOUNTER — Other Ambulatory Visit: Payer: Self-pay | Admitting: Adult Health

## 2023-12-25 ENCOUNTER — Encounter: Payer: Self-pay | Admitting: Adult Health

## 2023-12-25 DIAGNOSIS — E1165 Type 2 diabetes mellitus with hyperglycemia: Secondary | ICD-10-CM

## 2023-12-25 DIAGNOSIS — G43009 Migraine without aura, not intractable, without status migrainosus: Secondary | ICD-10-CM

## 2023-12-25 DIAGNOSIS — F411 Generalized anxiety disorder: Secondary | ICD-10-CM

## 2023-12-26 MED ORDER — METFORMIN HCL 1000 MG PO TABS: ORAL_TABLET | ORAL | 3 refills | Status: DC

## 2023-12-26 MED ORDER — PAROXETINE HCL 40 MG PO TABS
ORAL_TABLET | ORAL | 2 refills | Status: DC
Start: 2023-12-26 — End: 2024-10-14

## 2023-12-26 MED ORDER — HYDROXYZINE HCL 10 MG PO TABS
10.0000 mg | ORAL_TABLET | Freq: Three times a day (TID) | ORAL | 2 refills | Status: DC | PRN
Start: 2023-12-26 — End: 2024-05-04

## 2024-01-08 MED ORDER — AMITRIPTYLINE HCL 50 MG PO TABS: 50.0000 mg | ORAL_TABLET | Freq: Every day | ORAL | 3 refills | Status: DC

## 2024-01-08 NOTE — Telephone Encounter (Signed)
Please advise the requested medication hasn't been filled in a while.

## 2024-01-08 NOTE — Addendum Note (Signed)
Addended by: Waymon Amato R on: 01/08/2024 10:29 AM   Modules accepted: Orders

## 2024-01-19 ENCOUNTER — Other Ambulatory Visit: Payer: Self-pay | Admitting: Adult Health

## 2024-01-19 DIAGNOSIS — I10 Essential (primary) hypertension: Secondary | ICD-10-CM

## 2024-01-28 ENCOUNTER — Other Ambulatory Visit: Payer: Self-pay | Admitting: Adult Health

## 2024-01-28 DIAGNOSIS — E1165 Type 2 diabetes mellitus with hyperglycemia: Secondary | ICD-10-CM

## 2024-01-28 NOTE — Telephone Encounter (Signed)
Spoke with pharmacy refill request can be ignored they received the refill request from 12/26/23

## 2024-02-04 ENCOUNTER — Ambulatory Visit: Payer: BC Managed Care – PPO | Admitting: Adult Health

## 2024-02-10 ENCOUNTER — Ambulatory Visit: Payer: BC Managed Care – PPO | Admitting: Adult Health

## 2024-02-10 ENCOUNTER — Encounter: Payer: Self-pay | Admitting: Adult Health

## 2024-02-10 VITALS — BP 122/82 | HR 100 | Temp 97.6°F | Ht 69.0 in | Wt 181.4 lb

## 2024-02-10 DIAGNOSIS — I10 Essential (primary) hypertension: Secondary | ICD-10-CM | POA: Diagnosis not present

## 2024-02-10 DIAGNOSIS — Z7984 Long term (current) use of oral hypoglycemic drugs: Secondary | ICD-10-CM

## 2024-02-10 DIAGNOSIS — E119 Type 2 diabetes mellitus without complications: Secondary | ICD-10-CM

## 2024-02-10 LAB — POCT GLYCOSYLATED HEMOGLOBIN (HGB A1C): Hemoglobin A1C: 6.2 % — AB (ref 4.0–5.6)

## 2024-02-10 MED ORDER — FLUTICASONE PROPIONATE 50 MCG/ACT NA SUSP: 2.0000 | Freq: Every day | NASAL | 6 refills | Status: AC

## 2024-02-10 NOTE — Patient Instructions (Addendum)
 Your A1c was 6.2! That is great   Work on reducing carbs and sugars   Follow up in 3 months

## 2024-02-10 NOTE — Progress Notes (Signed)
 Subjective:    Patient ID: Dustin Henry, male    DOB: Sep 14, 1971, 53 y.o.   MRN: [id number]  HPI 53 year old male who  has a past medical history of ALLERGIC RHINITIS (08/18/2007), Allergy, Anxiety, BACK PAIN, Depression, Diabetes mellitus without complication (HCC), Difficult intubation, History of kidney stones, HYPERLIPIDEMIA (02/12/2010), HYPERTENSION, IBS (irritable bowel syndrome), MIGRAINE HEADACHE, Neuromuscular disorder (HCC), and Nocturia.  DM Type 2 -currently maintained with metformin 1000 mg twice daily.  He has been taking his medications as directed.  He is eating healthier and drinking more water throughout the day. He does not check his blood sugars at home.  Lab Results  Component Value Date   HGBA1C 6.2 (A) 02/10/2024   HGBA1C 6.3 11/05/2023   HGBA1C 5.8 (A) 07/31/2023   Hypertension-managed with  lisinopril/hydrochlorothiazide 20-25 mg daily.  He denies dizziness, lightheadedness, chest pain, or shortness of breath BP Readings from Last 3 Encounters:  02/10/24 122/82  11/05/23 120/70  07/31/23 138/86    Review of Systems See HPI   Past Medical History:  Diagnosis Date   ALLERGIC RHINITIS 08/18/2007   Allergy    seasonal   Anxiety    BACK PAIN    herniated disc   Depression    takes Paxil daily   Diabetes mellitus without complication (HCC)    Difficult intubation    09/05/10: difficult airway documented (ant cords vis with head up/cricoid press), MAC 4 used   History of kidney stones    HYPERLIPIDEMIA 02/12/2010   HYPERTENSION    takes Amlodipine and Lisinopril-HCTZ daily   IBS (irritable bowel syndrome)    MIGRAINE HEADACHE    takes Imitrex daily as needed   Neuromuscular disorder (HCC)    nerve pain left leg post op back surgery   Nocturia     Social History   Socioeconomic History   Marital status: Married    Spouse name: Not on file   Number of children: 1   Years of education: Not on file   Highest education level: Not on file   Occupational History   Occupation: pressman helper    Comment: Presenter, broadcasting  Tobacco Use   Smoking status: Former    Current packs/day: 3.00    Average packs/day: 3.0 packs/day for 10.0 years (30.0 ttl pk-yrs)    Types: Cigarettes   Smokeless tobacco: Never   Tobacco comments:    quit smoking about 31yrs ago  Substance and Sexual Activity   Alcohol use: Yes    Comment: occasionally   Drug use: No   Sexual activity: Not on file  Other Topics Concern   Not on file  Social History Narrative   Not on file   Social Drivers of Health   Financial Resource Strain: Not on file  Food Insecurity: Not on file  Transportation Needs: Not on file  Physical Activity: Not on file  Stress: Not on file  Social Connections: Not on file  Intimate Partner Violence: Not on file    Past Surgical History:  Procedure Laterality Date   COLONOSCOPY WITH PROPOFOL N/A 06/25/2021   Procedure: COLONOSCOPY WITH PROPOFOL;  Surgeon: Tressia Danas, MD;  Location: [facility];  Service: Gastroenterology;  Laterality: N/A;   CYSTOSCOPY     FINGER SURGERY     POLYPECTOMY  06/25/2021   Procedure: POLYPECTOMY;  Surgeon: Tressia Danas, MD;  Location: [facility];  Service: Gastroenterology;;   POSTERIOR CERVICAL FUSION/FORAMINOTOMY N/A 11/22/2015   Procedure: TLIF L5-S1;  Surgeon: Venita Lick, MD;  Location: [facility];  Service: Orthopedics;  Laterality: N/A;   SHOULDER SURGERY Right 03/2018   Dr. Ranell Patrick    Family History  Problem Relation Age of Onset   High blood pressure Father    Diabetes Father    Arthritis Father    Diabetes Paternal Grandmother    Heart disease Other        No family history   Colon cancer Neg Hx    Esophageal cancer Neg Hx    Colon polyps Neg Hx    Rectal cancer Neg Hx    Stomach cancer Neg Hx     Allergies  Allergen Reactions   Doxycycline Nausea Only   Other     Cough medication "starts with a B made him sick", cannot recall name or exact reaction    Penicillins Other (See Comments)    Unknown   Tessalon [Benzonatate] Other (See Comments)    Unknown     Current Outpatient Medications on File Prior to Visit  Medication Sig Dispense Refill   amitriptyline (ELAVIL) 50 MG tablet Take 1 tablet (50 mg total) by mouth at bedtime. 90 tablet 3   atorvastatin (LIPITOR) 40 MG tablet TAKE 1 TABLET(40 MG) BY MOUTH DAILY 30 tablet 11   Blood Glucose Monitoring Suppl (ONE TOUCH ULTRA 2) w/Device KIT 1 kit by Does not apply route in the morning, at noon, and at bedtime. 1 kit 0   cetirizine (ZYRTEC) 10 MG tablet Take 10 mg by mouth in the morning.     gabapentin (NEURONTIN) 800 MG tablet Take 800 mg by mouth 3 (three) times daily.  2   glucose blood test strip Use as instructed 300 each 6   HYDROcodone-acetaminophen (NORCO/VICODIN) 5-325 MG tablet Take 1 tablet by mouth 4 (four) times daily as needed.     hydrOXYzine (ATARAX) 10 MG tablet Take 1 tablet (10 mg total) by mouth 3 (three) times daily as needed. TAKE 1 TABLET(10 MG) BY MOUTH THREE TIMES DAILY AS NEEDED 90 tablet 2   ibuprofen (ADVIL) 200 MG tablet Take 800 mg by mouth 2 (two) times daily as needed for moderate pain.     Lancets (ONETOUCH ULTRASOFT) lancets Use as instructed 300 each 6   lisinopril-hydrochlorothiazide (ZESTORETIC) 20-25 MG tablet TAKE 1 TABLET BY MOUTH DAILY 30 tablet 11   metFORMIN (GLUCOPHAGE) 1000 MG tablet TAKE 1 TABLET(1000 MG) BY MOUTH TWICE DAILY WITH A MEAL 180 tablet 3   ondansetron (ZOFRAN) 8 MG tablet Take 1 tablet (8 mg total) by mouth every 8 (eight) hours as needed for nausea or vomiting. 20 tablet 0   PARoxetine (PAXIL) 40 MG tablet TAKE 1 TABLET(40 MG) BY MOUTH DAILY 90 tablet 2   promethazine (PHENERGAN) 25 MG tablet TAKE 1 TABLET BY MOUTH EVERY 8 HOURS AS NEEDED 30 tablet 1   SUMAtriptan (IMITREX) 100 MG tablet Take 1 tablet (100 mg total) by mouth every 2 (two) hours as needed for migraine. May repeat in 2 hours if headache persists or recurs.TAKE 1  TABLET(100 MG) BY MOUTH EVERY 2 HOURS AS NEEDED FOR MIGRAINE. MAY REPEAT IN 2 HOURS IF HEADACHE PERSISTS OR RECURS 15 tablet 3   No current facility-administered medications on file prior to visit.    BP 122/82   Pulse 100   Temp 97.6 F (36.4 C) (Oral)   Ht 5\' 9"  (1.753 m)   Wt 181 lb 6.4 oz (82.3 kg)   SpO2 97%   BMI 26.79 kg/m  Objective:   Physical Exam Vitals and nursing note reviewed.  Constitutional:      Appearance: Normal appearance.  Cardiovascular:     Rate and Rhythm: Normal rate and regular rhythm.     Pulses: Normal pulses.     Heart sounds: Normal heart sounds.  Pulmonary:     Effort: Pulmonary effort is normal.     Breath sounds: Normal breath sounds.  Skin:    General: Skin is warm and dry.  Neurological:     General: No focal deficit present.     Mental Status: He is alert and oriented to person, place, and time.  Psychiatric:        Mood and Affect: Mood normal.        Behavior: Behavior normal.        Thought Content: Thought content normal.        Judgment: Judgment normal.       Assessment & Plan:  1. Diabetes mellitus treated with oral medication (HCC) (Primary)  - POC HgB A1c- 6.2  - enocuraged to use Libre  - Continue with Metformin  - Follow up in 3 months  2. Essential hypertension - Well controlled. No change in medication   Shirline Frees, NP

## 2024-03-21 ENCOUNTER — Other Ambulatory Visit: Payer: Self-pay | Admitting: Adult Health

## 2024-03-21 DIAGNOSIS — E785 Hyperlipidemia, unspecified: Secondary | ICD-10-CM

## 2024-05-03 ENCOUNTER — Other Ambulatory Visit: Payer: Self-pay | Admitting: Adult Health

## 2024-05-03 DIAGNOSIS — F411 Generalized anxiety disorder: Secondary | ICD-10-CM

## 2024-05-12 ENCOUNTER — Ambulatory Visit: Admitting: Adult Health

## 2024-05-12 ENCOUNTER — Encounter: Payer: Self-pay | Admitting: Adult Health

## 2024-05-12 VITALS — BP 120/80 | HR 68 | Temp 97.4°F | Ht 69.0 in | Wt 184.0 lb

## 2024-05-12 DIAGNOSIS — E119 Type 2 diabetes mellitus without complications: Secondary | ICD-10-CM | POA: Diagnosis not present

## 2024-05-12 DIAGNOSIS — I10 Essential (primary) hypertension: Secondary | ICD-10-CM | POA: Diagnosis not present

## 2024-05-12 DIAGNOSIS — Z7984 Long term (current) use of oral hypoglycemic drugs: Secondary | ICD-10-CM | POA: Diagnosis not present

## 2024-05-12 LAB — POCT GLYCOSYLATED HEMOGLOBIN (HGB A1C): Hemoglobin A1C: 5.8 % — AB (ref 4.0–5.6)

## 2024-05-12 NOTE — Progress Notes (Signed)
 Subjective:    Patient ID: Dustin Henry, male    DOB: 07-14-1971, 53 y.o.   MRN: [id number]  HPI 53 year old male who  has a past medical history of ALLERGIC RHINITIS (08/18/2007), Allergy, Anxiety, BACK PAIN, Depression, Diabetes mellitus without complication (HCC), Difficult intubation, History of kidney stones, HYPERLIPIDEMIA (02/12/2010), HYPERTENSION, IBS (irritable bowel syndrome), MIGRAINE HEADACHE, Neuromuscular disorder (HCC), and Nocturia.  He presents to the office today for follow up regarding DM and HTN   DM Type 2 -currently maintained with metformin  1000 mg twice daily.  He has been taking his medications as directed.  He is eating healthier and drinking more water throughout the day. He does not check his blood sugars at home but has the [facility] system. Lab Results  Component Value Date   HGBA1C 5.8 (A) 05/12/2024   HGBA1C 6.2 (A) 02/10/2024   HGBA1C 6.3 11/05/2023   Hypertension-managed with  lisinopril /hydrochlorothiazide  20-25 mg daily.  He denies dizziness, lightheadedness, chest pain, or shortness of breath BP Readings from Last 3 Encounters:  05/12/24 120/80  02/10/24 122/82  11/05/23 120/70   Wt Readings from Last 3 Encounters:  05/12/24 184 lb (83.5 kg)  02/10/24 181 lb 6.4 oz (82.3 kg)  11/05/23 175 lb (79.4 kg)    Review of Systems See HPI   Past Medical History:  Diagnosis Date   ALLERGIC RHINITIS 08/18/2007   Allergy    seasonal   Anxiety    BACK PAIN    herniated disc   Depression    takes Paxil  daily   Diabetes mellitus without complication (HCC)    Difficult intubation    09/05/10: difficult airway documented (ant cords vis with head up/cricoid press), MAC 4 used   History of kidney stones    HYPERLIPIDEMIA 02/12/2010   HYPERTENSION    takes Amlodipine  and Lisinopril -HCTZ daily   IBS (irritable bowel syndrome)    MIGRAINE HEADACHE    takes Imitrex  daily as needed   Neuromuscular disorder (HCC)    nerve pain left leg post op back  surgery   Nocturia     Social History   Socioeconomic History   Marital status: Married    Spouse name: Not on file   Number of children: 1   Years of education: Not on file   Highest education level: Not on file  Occupational History   Occupation: pressman helper    Comment: Presenter, broadcasting  Tobacco Use   Smoking status: Former    Current packs/day: 3.00    Average packs/day: 3.0 packs/day for 10.0 years (30.0 ttl pk-yrs)    Types: Cigarettes   Smokeless tobacco: Never   Tobacco comments:    quit smoking about 32yrs ago  Substance and Sexual Activity   Alcohol use: Yes    Comment: occasionally   Drug use: No   Sexual activity: Not on file  Other Topics Concern   Not on file  Social History Narrative   Not on file   Social Drivers of Health   Financial Resource Strain: Not on file  Food Insecurity: Not on file  Transportation Needs: Not on file  Physical Activity: Not on file  Stress: Not on file  Social Connections: Not on file  Intimate Partner Violence: Not on file    Past Surgical History:  Procedure Laterality Date   COLONOSCOPY WITH PROPOFOL  N/A 06/25/2021   Procedure: COLONOSCOPY WITH PROPOFOL ;  Surgeon: Lindle Rhea, MD;  Location: [facility];  Service: Gastroenterology;  Laterality: N/A;  CYSTOSCOPY     FINGER SURGERY     POLYPECTOMY  06/25/2021   Procedure: POLYPECTOMY;  Surgeon: Lindle Rhea, MD;  Location: [facility];  Service: Gastroenterology;;   POSTERIOR CERVICAL FUSION/FORAMINOTOMY N/A 11/22/2015   Procedure: TLIF L5-S1;  Surgeon: Mort Ards, MD;  Location: [facility];  Service: Orthopedics;  Laterality: N/A;   SHOULDER SURGERY Right 03/2018   Dr. Brunilda Capra    Family History  Problem Relation Age of Onset   High blood pressure Father    Diabetes Father    Arthritis Father    Diabetes Paternal Grandmother    Heart disease Other        No family history   Colon cancer Neg Hx    Esophageal cancer Neg Hx    Colon polyps Neg Hx     Rectal cancer Neg Hx    Stomach cancer Neg Hx     Allergies  Allergen Reactions   Doxycycline Nausea Only   Other     Cough medication "starts with a B made him sick", cannot recall name or exact reaction   Penicillins Other (See Comments)    Unknown   Tessalon  [Benzonatate ] Other (See Comments)    Unknown     Current Outpatient Medications on File Prior to Visit  Medication Sig Dispense Refill   amitriptyline  (ELAVIL ) 50 MG tablet Take 1 tablet (50 mg total) by mouth at bedtime. 90 tablet 3   atorvastatin  (LIPITOR) 40 MG tablet TAKE 1 TABLET(40 MG) BY MOUTH DAILY 30 tablet 11   Blood Glucose Monitoring Suppl (ONE TOUCH ULTRA 2) w/Device KIT 1 kit by Does not apply route in the morning, at noon, and at bedtime. 1 kit 0   cetirizine (ZYRTEC) 10 MG tablet Take 10 mg by mouth in the morning.     fluticasone  (FLONASE ) 50 MCG/ACT nasal spray Place 2 sprays into both nostrils daily. 16 g 6   gabapentin  (NEURONTIN ) 800 MG tablet Take 800 mg by mouth 3 (three) times daily.  2   glucose blood test strip Use as instructed 300 each 6   HYDROcodone -acetaminophen  (NORCO/VICODIN) 5-325 MG tablet Take 1 tablet by mouth 4 (four) times daily as needed.     hydrOXYzine  (ATARAX ) 10 MG tablet TAKE 1 TABLET(10 MG) BY MOUTH THREE TIMES DAILY AS NEEDED 90 tablet 2   ibuprofen (ADVIL) 200 MG tablet Take 800 mg by mouth 2 (two) times daily as needed for moderate pain.     Lancets (ONETOUCH ULTRASOFT) lancets Use as instructed 300 each 6   lisinopril -hydrochlorothiazide  (ZESTORETIC ) 20-25 MG tablet TAKE 1 TABLET BY MOUTH DAILY 30 tablet 11   metFORMIN  (GLUCOPHAGE ) 1000 MG tablet TAKE 1 TABLET(1000 MG) BY MOUTH TWICE DAILY WITH A MEAL 180 tablet 3   ondansetron  (ZOFRAN ) 8 MG tablet Take 1 tablet (8 mg total) by mouth every 8 (eight) hours as needed for nausea or vomiting. 20 tablet 0   PARoxetine  (PAXIL ) 40 MG tablet TAKE 1 TABLET(40 MG) BY MOUTH DAILY 90 tablet 2   promethazine  (PHENERGAN ) 25 MG tablet TAKE 1  TABLET BY MOUTH EVERY 8 HOURS AS NEEDED 30 tablet 1   SUMAtriptan  (IMITREX ) 100 MG tablet Take 1 tablet (100 mg total) by mouth every 2 (two) hours as needed for migraine. May repeat in 2 hours if headache persists or recurs.TAKE 1 TABLET(100 MG) BY MOUTH EVERY 2 HOURS AS NEEDED FOR MIGRAINE. MAY REPEAT IN 2 HOURS IF HEADACHE PERSISTS OR RECURS 15 tablet 3   No current facility-administered medications on  file prior to visit.    BP 120/80   Pulse 68   Temp (!) 97.4 F (36.3 C) (Oral)   Ht 5\' 9"  (1.753 m)   Wt 184 lb (83.5 kg)   SpO2 99%   BMI 27.17 kg/m       Objective:   Physical Exam Vitals and nursing note reviewed.  Constitutional:      Appearance: Normal appearance.  Cardiovascular:     Rate and Rhythm: Normal rate and regular rhythm.     Pulses: Normal pulses.     Heart sounds: Normal heart sounds.  Pulmonary:     Effort: Pulmonary effort is normal.     Breath sounds: Normal breath sounds.  Musculoskeletal:        General: Normal range of motion.  Skin:    General: Skin is warm and dry.     Capillary Refill: Capillary refill takes less than 2 seconds.  Neurological:     General: No focal deficit present.     Mental Status: He is alert and oriented to person, place, and time.  Psychiatric:        Mood and Affect: Mood normal.        Behavior: Behavior normal.        Thought Content: Thought content normal.        Judgment: Judgment normal.        Assessment & Plan:  1. Diabetes mellitus treated with oral medication (HCC) (Primary)  - POC HgB A1c- 5.8 - improved and at goal  - Encouraged weight loss measures - No mediation changes today  - Follow up in 3 months   2. Essential hypertension - Well controlled. No change in medication   Alto Atta, NP

## 2024-05-12 NOTE — Patient Instructions (Addendum)
 Your A1c was 5.8 ! This improved from 6.2   Work on weight loss measures   Follow up in 3 months

## 2024-06-18 ENCOUNTER — Other Ambulatory Visit: Payer: Self-pay | Admitting: Family Medicine

## 2024-06-18 DIAGNOSIS — A084 Viral intestinal infection, unspecified: Secondary | ICD-10-CM

## 2024-07-16 ENCOUNTER — Encounter: Payer: Self-pay | Admitting: Adult Health

## 2024-07-16 DIAGNOSIS — F411 Generalized anxiety disorder: Secondary | ICD-10-CM

## 2024-07-16 MED ORDER — HYDROXYZINE HCL 10 MG PO TABS: ORAL_TABLET | ORAL | 2 refills | Status: DC

## 2024-08-12 ENCOUNTER — Ambulatory Visit: Admitting: Adult Health

## 2024-08-18 ENCOUNTER — Ambulatory Visit: Admitting: Adult Health

## 2024-08-18 ENCOUNTER — Encounter: Payer: Self-pay | Admitting: Adult Health

## 2024-08-18 VITALS — BP 120/80 | Temp 98.2°F | Ht 69.0 in | Wt 182.0 lb

## 2024-08-18 DIAGNOSIS — I10 Essential (primary) hypertension: Secondary | ICD-10-CM

## 2024-08-18 DIAGNOSIS — Z7984 Long term (current) use of oral hypoglycemic drugs: Secondary | ICD-10-CM

## 2024-08-18 DIAGNOSIS — E119 Type 2 diabetes mellitus without complications: Secondary | ICD-10-CM | POA: Diagnosis not present

## 2024-08-18 LAB — POCT GLYCOSYLATED HEMOGLOBIN (HGB A1C): Hemoglobin A1C: 5.8 % — AB (ref 4.0–5.6)

## 2024-08-18 NOTE — Progress Notes (Signed)
 Subjective:    Patient ID: Dustin Henry, male    DOB: 1971-03-26, 53 y.o.   MRN: [id number]  HPI 53 year old male who  has a past medical history of ALLERGIC RHINITIS (08/18/2007), Allergy, Anxiety, BACK PAIN, Depression, Diabetes mellitus without complication (HCC), Difficult intubation, History of kidney stones, HYPERLIPIDEMIA (02/12/2010), HYPERTENSION, IBS (irritable bowel syndrome), MIGRAINE HEADACHE, Neuromuscular disorder (HCC), and Nocturia.  He presents to the office today for follow up regarding DM and HTN   DM Type 2 -currently maintained with metformin  1000 mg twice daily.  He has been taking his medications as directed.  He is eating healthier and drinking more water throughout the day. He does not check his blood sugars at home but has the [facility] system. Lab Results  Component Value Date   HGBA1C 5.8 (A) 08/18/2024   HGBA1C 5.8 (A) 05/12/2024   HGBA1C 6.2 (A) 02/10/2024   Wt Readings from Last 3 Encounters:  08/18/24 182 lb (82.6 kg)  05/12/24 184 lb (83.5 kg)  02/10/24 181 lb 6.4 oz (82.3 kg)    Hypertension-managed with  lisinopril /hydrochlorothiazide  20-25 mg daily.  He denies dizziness, lightheadedness, chest pain, or shortness of breath BP Readings from Last 3 Encounters:  08/18/24 120/80  05/12/24 120/80  02/10/24 122/82     Review of Systems See HPI   Past Medical History:  Diagnosis Date   ALLERGIC RHINITIS 08/18/2007   Allergy    seasonal   Anxiety    BACK PAIN    herniated disc   Depression    takes Paxil  daily   Diabetes mellitus without complication (HCC)    Difficult intubation    09/05/10: difficult airway documented (ant cords vis with head up/cricoid press), MAC 4 used   History of kidney stones    HYPERLIPIDEMIA 02/12/2010   HYPERTENSION    takes Amlodipine  and Lisinopril -HCTZ daily   IBS (irritable bowel syndrome)    MIGRAINE HEADACHE    takes Imitrex  daily as needed   Neuromuscular disorder (HCC)    nerve pain left leg post  op back surgery   Nocturia     Social History   Socioeconomic History   Marital status: Married    Spouse name: Not on file   Number of children: 1   Years of education: Not on file   Highest education level: Not on file  Occupational History   Occupation: pressman helper    Comment: Presenter, broadcasting  Tobacco Use   Smoking status: Former    Current packs/day: 3.00    Average packs/day: 3.0 packs/day for 10.0 years (30.0 ttl pk-yrs)    Types: Cigarettes   Smokeless tobacco: Never   Tobacco comments:    quit smoking about 27yrs ago  Substance and Sexual Activity   Alcohol use: Yes    Comment: occasionally   Drug use: No   Sexual activity: Not on file  Other Topics Concern   Not on file  Social History Narrative   Not on file   Social Drivers of Health   Financial Resource Strain: Not on file  Food Insecurity: Not on file  Transportation Needs: Not on file  Physical Activity: Not on file  Stress: Not on file  Social Connections: Not on file  Intimate Partner Violence: Not on file    Past Surgical History:  Procedure Laterality Date   COLONOSCOPY WITH PROPOFOL  N/A 06/25/2021   Procedure: COLONOSCOPY WITH PROPOFOL ;  Surgeon: Eda Iha, MD;  Location: [facility];  Service: Gastroenterology;  Laterality:  N/A;   CYSTOSCOPY     FINGER SURGERY     POLYPECTOMY  06/25/2021   Procedure: POLYPECTOMY;  Surgeon: Eda Iha, MD;  Location: [facility];  Service: Gastroenterology;;   POSTERIOR CERVICAL FUSION/FORAMINOTOMY N/A 11/22/2015   Procedure: TLIF L5-S1;  Surgeon: Donaciano Sprang, MD;  Location: [facility];  Service: Orthopedics;  Laterality: N/A;   SHOULDER SURGERY Right 03/2018   Dr. Kay    Family History  Problem Relation Age of Onset   High blood pressure Father    Diabetes Father    Arthritis Father    Diabetes Paternal Grandmother    Heart disease Other        No family history   Colon cancer Neg Hx    Esophageal cancer Neg Hx    Colon polyps Neg  Hx    Rectal cancer Neg Hx    Stomach cancer Neg Hx     Allergies  Allergen Reactions   Doxycycline Nausea Only   Other     Cough medication starts with a B made him sick, cannot recall name or exact reaction   Penicillins Other (See Comments)    Unknown   Tessalon  [Benzonatate ] Other (See Comments)    Unknown     Current Outpatient Medications on File Prior to Visit  Medication Sig Dispense Refill   amitriptyline  (ELAVIL ) 50 MG tablet Take 1 tablet (50 mg total) by mouth at bedtime. 90 tablet 3   atorvastatin  (LIPITOR) 40 MG tablet TAKE 1 TABLET(40 MG) BY MOUTH DAILY 30 tablet 11   Blood Glucose Monitoring Suppl (ONE TOUCH ULTRA 2) w/Device KIT 1 kit by Does not apply route in the morning, at noon, and at bedtime. 1 kit 0   cetirizine (ZYRTEC) 10 MG tablet Take 10 mg by mouth in the morning.     fluticasone  (FLONASE ) 50 MCG/ACT nasal spray Place 2 sprays into both nostrils daily. 16 g 6   gabapentin  (NEURONTIN ) 800 MG tablet Take 800 mg by mouth 3 (three) times daily.  2   glucose blood test strip Use as instructed 300 each 6   HYDROcodone -acetaminophen  (NORCO/VICODIN) 5-325 MG tablet Take 1 tablet by mouth 4 (four) times daily as needed.     hydrOXYzine  (ATARAX ) 10 MG tablet TAKE 1 TABLET(10 MG) BY MOUTH THREE TIMES DAILY AS NEEDED 90 tablet 2   ibuprofen (ADVIL) 200 MG tablet Take 800 mg by mouth 2 (two) times daily as needed for moderate pain.     Lancets (ONETOUCH ULTRASOFT) lancets Use as instructed 300 each 6   lisinopril -hydrochlorothiazide  (ZESTORETIC ) 20-25 MG tablet TAKE 1 TABLET BY MOUTH DAILY 30 tablet 11   metFORMIN  (GLUCOPHAGE ) 1000 MG tablet TAKE 1 TABLET(1000 MG) BY MOUTH TWICE DAILY WITH A MEAL 180 tablet 3   ondansetron  (ZOFRAN ) 8 MG tablet TAKE 1 TABLET BY MOUTH EVERY 8 HOURS AS NEEDED FOR FOR NAUSEA AND VOMITING 20 tablet 0   PARoxetine  (PAXIL ) 40 MG tablet TAKE 1 TABLET(40 MG) BY MOUTH DAILY 90 tablet 2   promethazine  (PHENERGAN ) 25 MG tablet TAKE 1 TABLET BY  MOUTH EVERY 8 HOURS AS NEEDED 30 tablet 1   SUMAtriptan  (IMITREX ) 100 MG tablet Take 1 tablet (100 mg total) by mouth every 2 (two) hours as needed for migraine. May repeat in 2 hours if headache persists or recurs.TAKE 1 TABLET(100 MG) BY MOUTH EVERY 2 HOURS AS NEEDED FOR MIGRAINE. MAY REPEAT IN 2 HOURS IF HEADACHE PERSISTS OR RECURS 15 tablet 3   No current facility-administered medications on  file prior to visit.    BP 120/80   Temp 98.2 F (36.8 C) (Oral)   Ht 5' 9 (1.753 m)   Wt 182 lb (82.6 kg)   BMI 26.88 kg/m       Objective:   Physical Exam Vitals and nursing note reviewed.  Constitutional:      Appearance: Normal appearance. He is overweight.  Cardiovascular:     Rate and Rhythm: Normal rate and regular rhythm.     Pulses: Normal pulses.     Heart sounds: Normal heart sounds.  Pulmonary:     Effort: Pulmonary effort is normal.     Breath sounds: Normal breath sounds.  Skin:    General: Skin is warm and dry.  Neurological:     General: No focal deficit present.     Mental Status: He is alert and oriented to person, place, and time.  Psychiatric:        Mood and Affect: Mood normal.        Behavior: Behavior normal.        Thought Content: Thought content normal.        Judgment: Judgment normal.        Assessment & Plan:  1. Diabetes mellitus treated with oral medication (HCC) (Primary)  - POC HgB A1c- 5.8  - Well controlled. No change. Continue with Metformin    2. Essential hypertension - Well controlled. No change in medication   Darleene Shape, NP

## 2024-08-18 NOTE — Patient Instructions (Signed)
 Health Maintenance Due  Topic Date Due   Diabetic kidney evaluation - Urine ACR  Never done   Pneumococcal Vaccine: 50+ Years (1 of 2 - PCV) Never done   Hepatitis B Vaccines 19-59 Average Risk (1 of 3 - 19+ 3-dose series) Never done   Zoster Vaccines- Shingrix (1 of 2) Never done   COVID-19 Vaccine (3 - Pfizer risk series) 03/02/2020   Influenza Vaccine  07/09/2024       05/12/2024    7:30 AM 11/05/2023    7:37 AM 11/26/2022    2:54 PM  Depression screen PHQ 2/9  Decreased Interest 0 1 1  Down, Depressed, Hopeless 1 1 1   PHQ - 2 Score 1 2 2   Altered sleeping 0 1 0  Tired, decreased energy 1 1 2   Change in appetite 0 0 0  Feeling bad or failure about yourself  0 0 0  Trouble concentrating 3 2 2   Moving slowly or fidgety/restless 2 2 0  Suicidal thoughts 0 0 0  PHQ-9 Score 7 8 6   Difficult doing work/chores Not difficult at all Not difficult at all Somewhat difficult

## 2024-08-31 ENCOUNTER — Encounter: Payer: Self-pay | Admitting: Adult Health

## 2024-08-31 DIAGNOSIS — R110 Nausea: Secondary | ICD-10-CM

## 2024-08-31 MED ORDER — PROMETHAZINE HCL 25 MG PO TABS
ORAL_TABLET | ORAL | 0 refills | Status: AC
Start: 2024-08-31 — End: ?

## 2024-08-31 NOTE — Telephone Encounter (Signed)
 Ok to fill? Have not been filled in 2 years.

## 2024-10-13 ENCOUNTER — Other Ambulatory Visit: Payer: Self-pay | Admitting: Adult Health

## 2024-10-13 DIAGNOSIS — F411 Generalized anxiety disorder: Secondary | ICD-10-CM

## 2024-10-14 ENCOUNTER — Encounter: Payer: Self-pay | Admitting: Adult Health

## 2024-10-15 ENCOUNTER — Telehealth: Payer: Self-pay | Admitting: *Deleted

## 2024-10-15 ENCOUNTER — Other Ambulatory Visit: Payer: Self-pay | Admitting: Adult Health

## 2024-10-15 ENCOUNTER — Telehealth: Payer: Self-pay | Admitting: Adult Health

## 2024-10-15 DIAGNOSIS — F411 Generalized anxiety disorder: Secondary | ICD-10-CM

## 2024-10-15 NOTE — Telephone Encounter (Signed)
 Copied from CRM #[id number]. Topic: Clinical - Prescription Issue >> Oct 15, 2024  1:21 PM Paige D wrote: Reason for CRM: Pt is calling in regards to prescription issue he states the pharmacy is saying the provider denied the medication  PARoxetine  (PAXIL ) 40 MG tablet I advised pt the script was sent over to pharmacy and was confirmed by pharmacy at 10:07 am pt is calling pharmacy back . Please reach out to pt to discuss as he has been going back and forth since Tuesday

## 2024-10-15 NOTE — Telephone Encounter (Signed)
 Copied from CRM #[id number]. Topic: Clinical - Prescription Issue >> Oct 15, 2024  2:32 PM Taleah C wrote: Reason for CRM: [facility] Pharmacist called back because they still do not see the rx that was sent in yesterday for paxil . He asked if its possible for someone to give a verbal approval. CMA was not available at the time of call. Please advise.

## 2024-10-15 NOTE — Telephone Encounter (Signed)
 Called pharmacy and they stated that they have the medication now. No further action needed.

## 2024-11-11 ENCOUNTER — Ambulatory Visit (INDEPENDENT_AMBULATORY_CARE_PROVIDER_SITE_OTHER): Admitting: Adult Health

## 2024-11-11 ENCOUNTER — Ambulatory Visit: Payer: Self-pay | Admitting: Adult Health

## 2024-11-11 ENCOUNTER — Encounter: Payer: Self-pay | Admitting: Adult Health

## 2024-11-11 VITALS — BP 120/82 | HR 88 | Temp 99.0°F | Ht 69.0 in | Wt 179.0 lb

## 2024-11-11 DIAGNOSIS — F32A Depression, unspecified: Secondary | ICD-10-CM

## 2024-11-11 DIAGNOSIS — Z125 Encounter for screening for malignant neoplasm of prostate: Secondary | ICD-10-CM

## 2024-11-11 DIAGNOSIS — G43009 Migraine without aura, not intractable, without status migrainosus: Secondary | ICD-10-CM

## 2024-11-11 DIAGNOSIS — M545 Low back pain, unspecified: Secondary | ICD-10-CM

## 2024-11-11 DIAGNOSIS — G8929 Other chronic pain: Secondary | ICD-10-CM

## 2024-11-11 DIAGNOSIS — Z7984 Long term (current) use of oral hypoglycemic drugs: Secondary | ICD-10-CM

## 2024-11-11 DIAGNOSIS — I10 Essential (primary) hypertension: Secondary | ICD-10-CM

## 2024-11-11 DIAGNOSIS — F419 Anxiety disorder, unspecified: Secondary | ICD-10-CM

## 2024-11-11 DIAGNOSIS — Z0000 Encounter for general adult medical examination without abnormal findings: Secondary | ICD-10-CM | POA: Diagnosis not present

## 2024-11-11 DIAGNOSIS — E785 Hyperlipidemia, unspecified: Secondary | ICD-10-CM | POA: Diagnosis not present

## 2024-11-11 DIAGNOSIS — E119 Type 2 diabetes mellitus without complications: Secondary | ICD-10-CM

## 2024-11-11 LAB — COMPREHENSIVE METABOLIC PANEL WITH GFR
ALT: 24 U/L (ref 0–53)
AST: 16 U/L (ref 0–37)
Albumin: 5.2 g/dL (ref 3.5–5.2)
Alkaline Phosphatase: 67 U/L (ref 39–117)
BUN: 18 mg/dL (ref 6–23)
CO2: 31 meq/L (ref 19–32)
Calcium: 10.7 mg/dL — ABNORMAL HIGH (ref 8.4–10.5)
Chloride: 99 meq/L (ref 96–112)
Creatinine, Ser: 1.03 mg/dL (ref 0.40–1.50)
GFR: 83.95 mL/min (ref >60.00)
Glucose, Bld: 154 mg/dL — ABNORMAL HIGH (ref 70–99)
Potassium: 4.5 meq/L (ref 3.5–5.1)
Sodium: 139 meq/L (ref 135–145)
Total Bilirubin: 0.8 mg/dL (ref 0.2–1.2)
Total Protein: 7.6 g/dL (ref 6.0–8.3)

## 2024-11-11 LAB — CBC
HCT: 45.7 % (ref 39.0–52.0)
Hemoglobin: 15.4 g/dL (ref 13.0–17.0)
MCHC: 33.6 g/dL (ref 30.0–36.0)
MCV: 88 fl (ref 78.0–100.0)
Platelets: 274 K/uL (ref 150.0–400.0)
RBC: 5.2 Mil/uL (ref 4.22–5.81)
RDW: 13.2 % (ref 11.5–15.5)
WBC: 4.4 K/uL (ref 4.0–10.5)

## 2024-11-11 LAB — LIPID PANEL
Cholesterol: 163 mg/dL (ref 0–200)
HDL: 38 mg/dL — ABNORMAL LOW (ref >39.00)
LDL Cholesterol: 75 mg/dL (ref 0–99)
NonHDL: 124.82
Total CHOL/HDL Ratio: 4
Triglycerides: 248 mg/dL — ABNORMAL HIGH (ref 0.0–149.0)
VLDL: 49.6 mg/dL — ABNORMAL HIGH (ref 0.0–40.0)

## 2024-11-11 LAB — PSA: PSA: 4.11 ng/mL — ABNORMAL HIGH (ref 0.10–4.00)

## 2024-11-11 LAB — MICROALBUMIN / CREATININE URINE RATIO
Creatinine,U: 118.4 mg/dL
Microalb Creat Ratio: 7.9 mg/g (ref 0.0–30.0)
Microalb, Ur: 0.9 mg/dL (ref 0.0–1.9)

## 2024-11-11 LAB — TSH: TSH: 1.32 u[IU]/mL (ref 0.35–5.50)

## 2024-11-11 LAB — HEMOGLOBIN A1C: Hgb A1c MFr Bld: 6.4 % (ref 4.6–6.5)

## 2024-11-11 MED ORDER — AMITRIPTYLINE HCL 50 MG PO TABS: 50.0000 mg | ORAL_TABLET | Freq: Every day | ORAL | 3 refills | Status: AC

## 2024-11-11 MED ORDER — PAROXETINE HCL 40 MG PO TABS: ORAL_TABLET | ORAL | 3 refills | Status: AC

## 2024-11-11 NOTE — Patient Instructions (Addendum)
 It was great seeing you today.   I am going to send in your Elavil  and Paxil  for you   Please call and schedule with therapy  Call [facility] for [facility]   [phone]  Follow up with me in 2 weeks

## 2024-11-11 NOTE — Progress Notes (Signed)
 Subjective:    Patient ID: Dustin Henry, male    DOB: 05-06-1971, 53 y.o.   MRN: [id number]  HPI Patient presents for yearly preventative medicine examination. He is a pleasant 53 year old male who  has a past medical history of ALLERGIC RHINITIS (08/18/2007), Allergy, Anxiety, BACK PAIN, Depression, Diabetes mellitus without complication (HCC), Difficult intubation, History of kidney stones, HYPERLIPIDEMIA (02/12/2010), HYPERTENSION, IBS (irritable bowel syndrome), MIGRAINE HEADACHE, Neuromuscular disorder (HCC), and Nocturia.  DM Type 2 -currently maintained with metformin  1000 mg twice daily.  He has been taking his medications as directed.  He is eating healthier and drinking more water throughout the day. He does not check his blood sugars at home but has the [place] [facility] system. Lab Results  Component Value Date   HGBA1C 5.8 (A) 08/18/2024   HGBA1C 5.8 (A) 05/12/2024   HGBA1C 6.2 (A) 02/10/2024   Hypertension-managed with  lisinopril /hydrochlorothiazide  20-25 mg daily.  He denies dizziness, lightheadedness, chest pain, or shortness of breath BP Readings from Last 3 Encounters:  11/11/24 120/82  08/18/24 120/80  05/12/24 120/80   Hyperlipidemia-managed with Lipitor 40 mg daily.  He denies myalgia or fatigue Lab Results  Component Value Date   CHOL 143 11/05/2023   HDL 37.60 (L) 11/05/2023   LDLCALC 78 11/05/2023   LDLDIRECT 98.0 09/06/2022   TRIG 139.0 11/05/2023   CHOLHDL 4 11/05/2023    Anxiety and depression-managed with Paxil  40 mg daily and Atarax  10 mg QHS. He feels as though his anxiety and depression are not controlled. He reports that he ran out of Paxil  a few weeks ago and has been feeling more depressed since being off the medication. He feels as though a lot of his depression stems from his marriage and work. He has seen a therapist in the past and is going to reach back out to him.   Migraine headaches-takes Imitrex  as needed and Elavil  25 mg QHS. Has very few  migraine headaches when he is taking his medication but ran out of Elavil  an unknown amount of time ago and for the last week he has been experiencing a migraine headache.   Chronic back and shoulder pain-seen by orthopedics and is managed gabapentin  and Norco.   All immunizations and health maintenance protocols were reviewed with the patient and needed orders were placed. Refused vaccinations today   Appropriate screening laboratory values were ordered for the patient including screening of hyperlipidemia, renal function and hepatic function. If indicated by BPH, a PSA was ordered.  Medication reconciliation,  past medical history, social history, problem list and allergies were reviewed in detail with the patient  Goals were established with regard to weight loss, exercise, and  diet in compliance with medications Wt Readings from Last 3 Encounters:  11/11/24 179 lb (81.2 kg)  08/18/24 182 lb (82.6 kg)  05/12/24 184 lb (83.5 kg)    Review of Systems  Constitutional: Negative.   HENT: Negative.    Eyes: Negative.   Respiratory: Negative.    Cardiovascular: Negative.   Gastrointestinal: Negative.   Endocrine: Negative.   Genitourinary: Negative.   Musculoskeletal:  Positive for arthralgias and back pain.  Skin: Negative.   Allergic/Immunologic: Negative.   Neurological: Negative.   Hematological: Negative.   Psychiatric/Behavioral:  Positive for agitation and dysphoric mood. The patient is nervous/anxious.   All other systems reviewed and are negative.  Past Medical History:  Diagnosis Date   ALLERGIC RHINITIS 08/18/2007   Allergy    seasonal   Anxiety  BACK PAIN    herniated disc   Depression    takes Paxil  daily   Diabetes mellitus without complication (HCC)    Difficult intubation    09/05/10: difficult airway documented (ant cords vis with head up/cricoid press), MAC 4 used   History of kidney stones    HYPERLIPIDEMIA 02/12/2010   HYPERTENSION    takes  Amlodipine  and Lisinopril -HCTZ daily   IBS (irritable bowel syndrome)    MIGRAINE HEADACHE    takes Imitrex  daily as needed   Neuromuscular disorder (HCC)    nerve pain left leg post op back surgery   Nocturia     Social History   Socioeconomic History   Marital status: Married    Spouse name: Not on file   Number of children: 1   Years of education: Not on file   Highest education level: Not on file  Occupational History   Occupation: pressman helper    Comment: presenter, broadcasting  Tobacco Use   Smoking status: Former    Current packs/day: 3.00    Average packs/day: 3.0 packs/day for 10.0 years (30.0 ttl pk-yrs)    Types: Cigarettes   Smokeless tobacco: Never   Tobacco comments:    quit smoking about 67yrs ago  Substance and Sexual Activity   Alcohol use: Yes    Comment: occasionally   Drug use: No   Sexual activity: Not on file  Other Topics Concern   Not on file  Social History Narrative   Not on file   Social Drivers of Health   Financial Resource Strain: Not on file  Food Insecurity: Not on file  Transportation Needs: Not on file  Physical Activity: Not on file  Stress: Not on file  Social Connections: Not on file  Intimate Partner Violence: Not on file    Past Surgical History:  Procedure Laterality Date   COLONOSCOPY WITH PROPOFOL  N/A 06/25/2021   Procedure: COLONOSCOPY WITH PROPOFOL ;  Surgeon: Eda Iha, MD;  Location: [facility];  Service: Gastroenterology;  Laterality: N/A;   CYSTOSCOPY     FINGER SURGERY     POLYPECTOMY  06/25/2021   Procedure: POLYPECTOMY;  Surgeon: Eda Iha, MD;  Location: [facility];  Service: Gastroenterology;;   POSTERIOR CERVICAL FUSION/FORAMINOTOMY N/A 11/22/2015   Procedure: TLIF L5-S1;  Surgeon: Donaciano Sprang, MD;  Location: [facility];  Service: Orthopedics;  Laterality: N/A;   SHOULDER SURGERY Right 03/2018   Dr. Kay    Family History  Problem Relation Age of Onset   High blood pressure Father     Diabetes Father    Arthritis Father    Diabetes Paternal Grandmother    Heart disease Other        No family history   Colon cancer Neg Hx    Esophageal cancer Neg Hx    Colon polyps Neg Hx    Rectal cancer Neg Hx    Stomach cancer Neg Hx     Allergies  Allergen Reactions   Doxycycline Nausea Only   Other     Cough medication starts with a B made him sick, cannot recall name or exact reaction   Penicillins Other (See Comments)    Unknown   Tessalon  [Benzonatate ] Other (See Comments)    Unknown     Current Outpatient Medications on File Prior to Visit  Medication Sig Dispense Refill   atorvastatin  (LIPITOR) 40 MG tablet TAKE 1 TABLET(40 MG) BY MOUTH DAILY 30 tablet 11   Blood Glucose Monitoring Suppl (ONE TOUCH ULTRA 2)  w/Device KIT 1 kit by Does not apply route in the morning, at noon, and at bedtime. 1 kit 0   cetirizine (ZYRTEC) 10 MG tablet Take 10 mg by mouth in the morning.     fluticasone  (FLONASE ) 50 MCG/ACT nasal spray Place 2 sprays into both nostrils daily. 16 g 6   gabapentin  (NEURONTIN ) 800 MG tablet Take 800 mg by mouth 3 (three) times daily.  2   glucose blood test strip Use as instructed 300 each 6   HYDROcodone -acetaminophen  (NORCO/VICODIN) 5-325 MG tablet Take 1 tablet by mouth 4 (four) times daily as needed.     hydrOXYzine  (ATARAX ) 10 MG tablet TAKE 1 TABLET(10 MG) BY MOUTH THREE TIMES DAILY AS NEEDED 90 tablet 2   ibuprofen (ADVIL) 200 MG tablet Take 800 mg by mouth 2 (two) times daily as needed for moderate pain.     Lancets (ONETOUCH ULTRASOFT) lancets Use as instructed 300 each 6   lisinopril -hydrochlorothiazide  (ZESTORETIC ) 20-25 MG tablet TAKE 1 TABLET BY MOUTH DAILY 30 tablet 11   metFORMIN  (GLUCOPHAGE ) 1000 MG tablet TAKE 1 TABLET(1000 MG) BY MOUTH TWICE DAILY WITH A MEAL 180 tablet 3   ondansetron  (ZOFRAN ) 8 MG tablet TAKE 1 TABLET BY MOUTH EVERY 8 HOURS AS NEEDED FOR FOR NAUSEA AND VOMITING 20 tablet 0   promethazine  (PHENERGAN ) 25 MG tablet TAKE 1  TABLET BY MOUTH EVERY 8 HOURS AS NEEDED 30 tablet 0   SUMAtriptan  (IMITREX ) 100 MG tablet Take 1 tablet (100 mg total) by mouth every 2 (two) hours as needed for migraine. May repeat in 2 hours if headache persists or recurs.TAKE 1 TABLET(100 MG) BY MOUTH EVERY 2 HOURS AS NEEDED FOR MIGRAINE. MAY REPEAT IN 2 HOURS IF HEADACHE PERSISTS OR RECURS 15 tablet 3   No current facility-administered medications on file prior to visit.    BP 120/82   Pulse 88   Temp 99 F (37.2 C) (Oral)   Ht 5' 9 (1.753 m)   Wt 179 lb (81.2 kg)   SpO2 98%   BMI 26.43 kg/m       Objective:   Physical Exam Vitals and nursing note reviewed.  Constitutional:      General: He is not in acute distress.    Appearance: Normal appearance. He is not ill-appearing.  HENT:     Head: Normocephalic and atraumatic.     Right Ear: Tympanic membrane, ear canal and external ear normal. There is no impacted cerumen.     Left Ear: Tympanic membrane, ear canal and external ear normal. There is no impacted cerumen.     Nose: Nose normal. No congestion or rhinorrhea.     Mouth/Throat:     Mouth: Mucous membranes are moist.     Pharynx: Oropharynx is clear.  Eyes:     Extraocular Movements: Extraocular movements intact.     Conjunctiva/sclera: Conjunctivae normal.     Pupils: Pupils are equal, round, and reactive to light.  Neck:     Vascular: No carotid bruit.  Cardiovascular:     Rate and Rhythm: Normal rate and regular rhythm.     Pulses: Normal pulses.     Heart sounds: No murmur heard.    No friction rub. No gallop.  Pulmonary:     Effort: Pulmonary effort is normal.     Breath sounds: Normal breath sounds.  Abdominal:     General: Abdomen is flat. Bowel sounds are normal. There is no distension.     Palpations: Abdomen is soft. There  is no mass.     Tenderness: There is no abdominal tenderness. There is no guarding or rebound.     Hernia: No hernia is present.  Musculoskeletal:        General: Normal range  of motion.     Cervical back: Normal range of motion and neck supple.  Lymphadenopathy:     Cervical: No cervical adenopathy.  Skin:    General: Skin is warm and dry.     Capillary Refill: Capillary refill takes less than 2 seconds.  Neurological:     General: No focal deficit present.     Mental Status: He is alert and oriented to person, place, and time.  Psychiatric:        Mood and Affect: Mood is anxious.        Speech: Speech is rapid and pressured.        Behavior: Behavior normal.        Thought Content: Thought content normal.        Judgment: Judgment normal.       Assessment & Plan:  1. Routine general medical examination at a health care facility (Primary) Today patient counseled on age appropriate routine health concerns for screening and prevention, each reviewed and up to date or declined. Immunizations reviewed and up to date or declined. Labs ordered and reviewed. Risk factors for depression reviewed and negative. Hearing function and visual acuity are intact. ADLs screened and addressed as needed. Functional ability and level of safety reviewed and appropriate. Education, counseling and referrals performed based on assessed risks today. Patient provided with a copy of personalized plan for preventive services.   2. Diabetes mellitus treated with oral medication (HCC) - Consider adding agent  - Lipid panel; Future - TSH; Future - CBC; Future - Comprehensive metabolic panel with GFR; Future - Hemoglobin A1c; Future - Microalbumin/Creatinine Ratio, Urine; Future  3. Essential hypertension - Well controlled. No change in medication  - Lipid panel; Future - TSH; Future - CBC; Future - Comprehensive metabolic panel with GFR; Future  4. Dyslipidemia - Consider increase in statin  - Lipid panel; Future - TSH; Future - CBC; Future - Comprehensive metabolic panel with GFR; Future  5. Anxiety and depression - Refill Paxil   - Follow up in 2-3 weeks - I think  therapy would be a good option for him and encouraged him to call  - Lipid panel; Future - TSH; Future - CBC; Future - Comprehensive metabolic panel with GFR; Future - PARoxetine  (PAXIL ) 40 MG tablet; TAKE 1 TABLET(40 MG) BY MOUTH DAILY  Dispense: 90 tablet; Refill: 3  6. Migraine without aura and without status migrainosus, not intractable - Will refill his Elavil   - Lipid panel; Future - TSH; Future - CBC; Future - Comprehensive metabolic panel with GFR; Future - amitriptyline  (ELAVIL ) 50 MG tablet; Take 1 tablet (50 mg total) by mouth at bedtime.  Dispense: 90 tablet; Refill: 3  7. Chronic low back pain without sciatica, unspecified back pain laterality - Per orthopedics  - Lipid panel; Future - TSH; Future - CBC; Future - Comprehensive metabolic panel with GFR; Future  8. Prostate cancer screening  - PSA; Future  Darleene Shape, NP

## 2024-11-12 ENCOUNTER — Other Ambulatory Visit

## 2024-11-15 ENCOUNTER — Other Ambulatory Visit: Payer: Self-pay | Admitting: Adult Health

## 2024-11-15 DIAGNOSIS — F411 Generalized anxiety disorder: Secondary | ICD-10-CM

## 2024-11-19 ENCOUNTER — Telehealth: Payer: Self-pay

## 2024-11-19 NOTE — Telephone Encounter (Signed)
 Please see other note

## 2024-11-19 NOTE — Telephone Encounter (Signed)
 Copied from CRM #[id number]. Topic: Clinical - Medication Question >> Nov 19, 2024 10:49 AM Revonda D wrote: Reason for CRM: Pt stated that he contacted the pharmacy regarding the hydrOXYzine  (ATARAX ) 10 MG refill. Pt stated that they stated they didn't receive the request and needs to have the refill approval resent to the pharmacy.

## 2024-11-19 NOTE — Telephone Encounter (Signed)
 Copied from CRM #[id number]. Topic: Clinical - Medication Question >> Nov 19, 2024 10:59 AM Charolett L wrote: Reason for CRM: Patient stated that he called the pharmacy and will be able to pick up the medication  hydrOXYzine  (ATARAX ) 10 MG tablet today

## 2024-11-19 NOTE — Telephone Encounter (Signed)
 Pt stated that he was able to get his medication. No further concerns.

## 2024-11-25 ENCOUNTER — Ambulatory Visit: Admitting: Adult Health

## 2024-12-27 ENCOUNTER — Other Ambulatory Visit: Payer: Self-pay | Admitting: Adult Health

## 2024-12-27 DIAGNOSIS — E1165 Type 2 diabetes mellitus with hyperglycemia: Secondary | ICD-10-CM

## 2025-01-06 ENCOUNTER — Encounter: Payer: Self-pay | Admitting: Adult Health

## 2025-01-06 DIAGNOSIS — E1165 Type 2 diabetes mellitus with hyperglycemia: Secondary | ICD-10-CM

## 2025-01-11 MED ORDER — METFORMIN HCL 1000 MG PO TABS: ORAL_TABLET | ORAL | 3 refills | Status: AC
